# Patient Record
Sex: Female | Born: 1975 | Race: White | Hispanic: No | Marital: Married | Smoking: Never smoker
Health system: Southern US, Community
[De-identification: ages and names within clinical notes are randomized; demographics above are authoritative.]

## PROBLEM LIST (undated history)

## (undated) DIAGNOSIS — R002 Palpitations: Secondary | ICD-10-CM

## (undated) DIAGNOSIS — M542 Cervicalgia: Secondary | ICD-10-CM

## (undated) DIAGNOSIS — R7303 Prediabetes: Secondary | ICD-10-CM

## (undated) DIAGNOSIS — R0602 Shortness of breath: Secondary | ICD-10-CM

## (undated) DIAGNOSIS — Z86718 Personal history of other venous thrombosis and embolism: Secondary | ICD-10-CM

## (undated) DIAGNOSIS — R0600 Dyspnea, unspecified: Secondary | ICD-10-CM

## (undated) DIAGNOSIS — F329 Major depressive disorder, single episode, unspecified: Secondary | ICD-10-CM

## (undated) DIAGNOSIS — M2550 Pain in unspecified joint: Secondary | ICD-10-CM

## (undated) DIAGNOSIS — Z91018 Allergy to other foods: Secondary | ICD-10-CM

## (undated) DIAGNOSIS — K219 Gastro-esophageal reflux disease without esophagitis: Secondary | ICD-10-CM

## (undated) DIAGNOSIS — R51 Headache: Secondary | ICD-10-CM

## (undated) DIAGNOSIS — G8929 Other chronic pain: Secondary | ICD-10-CM

## (undated) DIAGNOSIS — K649 Unspecified hemorrhoids: Secondary | ICD-10-CM

## (undated) DIAGNOSIS — C50919 Malignant neoplasm of unspecified site of unspecified female breast: Secondary | ICD-10-CM

## (undated) DIAGNOSIS — Z87898 Personal history of other specified conditions: Secondary | ICD-10-CM

## (undated) DIAGNOSIS — R519 Headache, unspecified: Secondary | ICD-10-CM

## (undated) DIAGNOSIS — K5900 Constipation, unspecified: Secondary | ICD-10-CM

## (undated) DIAGNOSIS — K589 Irritable bowel syndrome without diarrhea: Secondary | ICD-10-CM

## (undated) DIAGNOSIS — M549 Dorsalgia, unspecified: Secondary | ICD-10-CM

## (undated) DIAGNOSIS — E669 Obesity, unspecified: Secondary | ICD-10-CM

## (undated) DIAGNOSIS — K829 Disease of gallbladder, unspecified: Secondary | ICD-10-CM

## (undated) DIAGNOSIS — S3992XA Unspecified injury of lower back, initial encounter: Secondary | ICD-10-CM

## (undated) DIAGNOSIS — M7989 Other specified soft tissue disorders: Secondary | ICD-10-CM

## (undated) DIAGNOSIS — E559 Vitamin D deficiency, unspecified: Secondary | ICD-10-CM

## (undated) DIAGNOSIS — J40 Bronchitis, not specified as acute or chronic: Secondary | ICD-10-CM

## (undated) DIAGNOSIS — W19XXXA Unspecified fall, initial encounter: Secondary | ICD-10-CM

## (undated) DIAGNOSIS — R011 Cardiac murmur, unspecified: Secondary | ICD-10-CM

## (undated) DIAGNOSIS — F32A Depression, unspecified: Secondary | ICD-10-CM

## (undated) HISTORY — DX: Unspecified fall, initial encounter: W19.XXXA

## (undated) HISTORY — PX: OTHER SURGICAL HISTORY: SHX169

## (undated) HISTORY — PX: CHOLECYSTECTOMY: SHX55

## (undated) HISTORY — DX: Constipation, unspecified: K59.00

## (undated) HISTORY — DX: Pain in unspecified joint: M25.50

## (undated) HISTORY — DX: Personal history of other venous thrombosis and embolism: Z86.718

## (undated) HISTORY — PX: FOOT SURGERY: SHX648

## (undated) HISTORY — DX: Irritable bowel syndrome, unspecified: K58.9

## (undated) HISTORY — DX: Other chronic pain: G89.29

## (undated) HISTORY — DX: Cervicalgia: M54.2

## (undated) HISTORY — DX: Shortness of breath: R06.02

## (undated) HISTORY — DX: Vitamin D deficiency, unspecified: E55.9

## (undated) HISTORY — DX: Other specified soft tissue disorders: M79.89

## (undated) HISTORY — DX: Allergy to other foods: Z91.018

## (undated) HISTORY — DX: Disease of gallbladder, unspecified: K82.9

## (undated) HISTORY — DX: Depression, unspecified: F32.A

## (undated) HISTORY — DX: Palpitations: R00.2

---
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
## 2010-07-21 ENCOUNTER — Ambulatory Visit: Payer: Self-pay

## 2011-11-24 ENCOUNTER — Ambulatory Visit: Payer: Self-pay | Admitting: Podiatry

## 2012-05-05 ENCOUNTER — Ambulatory Visit: Payer: Self-pay | Admitting: Family Medicine

## 2013-02-02 ENCOUNTER — Ambulatory Visit: Payer: Self-pay | Admitting: General Practice

## 2014-08-13 ENCOUNTER — Ambulatory Visit: Payer: PRIVATE HEALTH INSURANCE | Admitting: Family Medicine

## 2014-09-10 ENCOUNTER — Ambulatory Visit: Payer: PRIVATE HEALTH INSURANCE | Admitting: Family Medicine

## 2015-07-28 ENCOUNTER — Ambulatory Visit: Payer: PRIVATE HEALTH INSURANCE | Admitting: Physician Assistant

## 2015-10-08 ENCOUNTER — Encounter: Payer: Self-pay | Admitting: Physician Assistant

## 2015-10-08 ENCOUNTER — Ambulatory Visit: Payer: Self-pay | Admitting: Physician Assistant

## 2015-10-08 VITALS — BP 120/80 | HR 81 | Temp 98.3°F

## 2015-10-08 DIAGNOSIS — J018 Other acute sinusitis: Secondary | ICD-10-CM

## 2015-10-08 DIAGNOSIS — Z299 Encounter for prophylactic measures, unspecified: Secondary | ICD-10-CM

## 2015-10-08 MED ORDER — AZITHROMYCIN 250 MG PO TABS: ORAL_TABLET | ORAL | Status: DC

## 2015-10-08 MED ORDER — FLUTICASONE PROPIONATE 50 MCG/ACT NA SUSP: 2.0000 | Freq: Every day | NASAL | Status: DC

## 2015-10-08 NOTE — Progress Notes (Signed)
S: C/o runny nose and congestion for 3 days, no fever, chills, cp/sob, v/d; mucus is green and thick, cough is sporadic, c/o of facial and dental pain.pain behind r eye and r ear  Using otc meds: otc decongestant  O: PE: vitals wnl, nad, perrl eomi, normocephalic, tms dull, nasal mucosa red and swollen, throat injected, neck supple no lymph, lungs c t a, cv rrr, neuro intact  A:  Acute sinusitis   P: zpack, flonase, drink fluids, continue regular meds , use otc meds of choice, return if not improving in 5 days, return earlier if worsening

## 2015-10-21 NOTE — Progress Notes (Signed)
Patient ID: Danielle Browning, female   DOB: 06/16/1976, 41 y.o.   MRN: [id number] Patient has been scheduled a screening mammogram for 2/21 @ 4:40pm.  Patient has been notified.

## 2015-10-22 ENCOUNTER — Ambulatory Visit: Payer: Self-pay | Admitting: Physician Assistant

## 2015-10-22 ENCOUNTER — Encounter: Payer: Self-pay | Admitting: Physician Assistant

## 2015-10-22 VITALS — BP 130/80 | HR 97 | Temp 98.6°F

## 2015-10-22 DIAGNOSIS — L988 Other specified disorders of the skin and subcutaneous tissue: Secondary | ICD-10-CM

## 2015-10-22 DIAGNOSIS — N63 Unspecified lump in unspecified breast: Secondary | ICD-10-CM

## 2015-10-22 DIAGNOSIS — N649 Disorder of breast, unspecified: Secondary | ICD-10-CM

## 2015-10-22 NOTE — Progress Notes (Signed)
S: c/o rash at left breast, area has been there for about a year, will heal then ooze, not painful but recently noticed a knot in center in of rash, hasn't been there before, no fever/chills; also has a place on r ankle that flares on and off, hit ankle about a year ago and will get really itchy at times, no drainage from this site  O: vitals wnl, nad, lungs c t a, cv rrr, skin with circular lesion with hard edge, dark blue edging like a vein, oozing at areas along ?vein, small knot size of pea noticed when deeply palpating tissue, area not red like cellulitis, n/v intact, area on r ankle is healed and pink  A: skin lesion, breast nodule  P: mammogram , refer to dermatology for eval of skin lesion on left breast

## 2015-10-27 ENCOUNTER — Other Ambulatory Visit: Payer: Self-pay

## 2015-10-28 ENCOUNTER — Ambulatory Visit
Admission: RE | Admit: 2015-10-28 | Discharge: 2015-10-28 | Disposition: A | Discharge status: Home / Self Care | Payer: Managed Care, Other (non HMO) | Source: Ambulatory Visit | Attending: Physician Assistant | Admitting: Physician Assistant

## 2015-10-28 DIAGNOSIS — R921 Mammographic calcification found on diagnostic imaging of breast: Secondary | ICD-10-CM | POA: Diagnosis not present

## 2015-10-28 DIAGNOSIS — Z1231 Encounter for screening mammogram for malignant neoplasm of breast: Secondary | ICD-10-CM | POA: Insufficient documentation

## 2015-10-28 DIAGNOSIS — Z299 Encounter for prophylactic measures, unspecified: Secondary | ICD-10-CM

## 2015-10-29 ENCOUNTER — Other Ambulatory Visit: Payer: Self-pay

## 2015-10-29 DIAGNOSIS — Z299 Encounter for prophylactic measures, unspecified: Secondary | ICD-10-CM

## 2015-10-29 NOTE — Progress Notes (Signed)
Patient came in to have blood drawn per Susan's orders.  Blood was drawn from the right hand without any incident.

## 2015-10-30 LAB — CMP12+LP+TP+TSH+6AC+CBC/D/PLT
ALT: 14 IU/L (ref 0–32)
AST: 14 IU/L (ref 0–40)
Albumin/Globulin Ratio: 1.3 (ref 1.1–2.5)
Albumin: 3.8 g/dL (ref 3.5–5.5)
Alkaline Phosphatase: 82 IU/L (ref 39–117)
BASOS ABS: 0 10*3/uL (ref 0.0–0.2)
BUN / CREAT RATIO: 17 (ref 9–23)
BUN: 11 mg/dL (ref 6–24)
Basos: 0 %
Bilirubin Total: 0.3 mg/dL (ref 0.0–1.2)
CALCIUM: 8.9 mg/dL (ref 8.7–10.2)
CHLORIDE: 101 mmol/L (ref 96–106)
CREATININE: 0.66 mg/dL (ref 0.57–1.00)
Chol/HDL Ratio: 4.5 ratio units — ABNORMAL HIGH (ref 0.0–4.4)
Cholesterol, Total: 194 mg/dL (ref 100–199)
EOS (ABSOLUTE): 0.2 10*3/uL (ref 0.0–0.4)
EOS: 3 %
ESTIMATED CHD RISK: 1.1 times avg. — AB (ref 0.0–1.0)
Free Thyroxine Index: 2.5 (ref 1.2–4.9)
GFR calc Af Amer: 127 mL/min/{1.73_m2} (ref >59)
GFR, EST NON AFRICAN AMERICAN: 110 mL/min/{1.73_m2} (ref >59)
GGT: 20 IU/L (ref 0–60)
GLUCOSE: 94 mg/dL (ref 65–99)
Globulin, Total: 2.9 g/dL (ref 1.5–4.5)
HDL: 43 mg/dL (ref >39)
HEMOGLOBIN: 13.1 g/dL (ref 11.1–15.9)
Hematocrit: 40.4 % (ref 34.0–46.6)
IRON: 73 ug/dL (ref 27–159)
Immature Grans (Abs): 0 10*3/uL (ref 0.0–0.1)
Immature Granulocytes: 0 %
LDH: 175 IU/L (ref 119–226)
LDL Calculated: 126 mg/dL — ABNORMAL HIGH (ref 0–99)
LYMPHS ABS: 2.1 10*3/uL (ref 0.7–3.1)
Lymphs: 29 %
MCH: 27.6 pg (ref 26.6–33.0)
MCHC: 32.4 g/dL (ref 31.5–35.7)
MCV: 85 fL (ref 79–97)
Monocytes Absolute: 0.4 10*3/uL (ref 0.1–0.9)
Monocytes: 5 %
NEUTROS ABS: 4.3 10*3/uL (ref 1.4–7.0)
Neutrophils: 63 %
PHOSPHORUS: 3.4 mg/dL (ref 2.5–4.5)
PLATELETS: 357 10*3/uL (ref 150–379)
POTASSIUM: 4.1 mmol/L (ref 3.5–5.2)
RBC: 4.75 x10E6/uL (ref 3.77–5.28)
RDW: 14.4 % (ref 12.3–15.4)
SODIUM: 140 mmol/L (ref 134–144)
T3 UPTAKE RATIO: 23 % — AB (ref 24–39)
T4 TOTAL: 10.8 ug/dL (ref 4.5–12.0)
TOTAL PROTEIN: 6.7 g/dL (ref 6.0–8.5)
TSH: 1.25 u[IU]/mL (ref 0.450–4.500)
Triglycerides: 127 mg/dL (ref 0–149)
URIC ACID: 7.1 mg/dL (ref 2.5–7.1)
VLDL Cholesterol Cal: 25 mg/dL (ref 5–40)
WBC: 7 10*3/uL (ref 3.4–10.8)

## 2015-10-30 LAB — VITAMIN D 25 HYDROXY (VIT D DEFICIENCY, FRACTURES): VIT D 25 HYDROXY: 13.1 ng/mL — AB (ref 30.0–100.0)

## 2015-10-30 LAB — HGB A1C W/O EAG: HEMOGLOBIN A1C: 5.7 % — AB (ref 4.8–5.6)

## 2015-10-30 NOTE — Progress Notes (Signed)
I spoke to the patient about her lab results and she expressed understanding.  A copy was securely emailed to the patient and a script for Vitamin D was called in to [facility] on [address] per [employer].

## 2015-10-31 ENCOUNTER — Other Ambulatory Visit: Payer: Self-pay | Admitting: Physician Assistant

## 2015-10-31 DIAGNOSIS — R928 Other abnormal and inconclusive findings on diagnostic imaging of breast: Secondary | ICD-10-CM

## 2015-11-06 ENCOUNTER — Other Ambulatory Visit: Payer: Self-pay | Admitting: Physician Assistant

## 2015-11-06 ENCOUNTER — Ambulatory Visit
Admission: RE | Admit: 2015-11-06 | Discharge: 2015-11-06 | Disposition: A | Discharge status: Home / Self Care | Payer: Managed Care, Other (non HMO) | Source: Ambulatory Visit | Attending: Physician Assistant | Admitting: Physician Assistant

## 2015-11-06 DIAGNOSIS — R921 Mammographic calcification found on diagnostic imaging of breast: Secondary | ICD-10-CM | POA: Insufficient documentation

## 2015-11-06 DIAGNOSIS — R928 Other abnormal and inconclusive findings on diagnostic imaging of breast: Secondary | ICD-10-CM

## 2015-11-06 DIAGNOSIS — N6002 Solitary cyst of left breast: Secondary | ICD-10-CM | POA: Diagnosis not present

## 2015-11-19 ENCOUNTER — Encounter: Payer: Self-pay | Admitting: Physician Assistant

## 2015-11-19 ENCOUNTER — Ambulatory Visit: Payer: Self-pay | Admitting: Physician Assistant

## 2015-11-19 VITALS — BP 130/70 | HR 84 | Temp 98.3°F

## 2015-11-19 DIAGNOSIS — F4321 Adjustment disorder with depressed mood: Secondary | ICD-10-CM

## 2015-11-19 MED ORDER — BUPROPION HCL ER (XL) 150 MG PO TB24: 150.0000 mg | ORAL_TABLET | Freq: Every day | ORAL | Status: DC

## 2015-11-19 NOTE — Progress Notes (Signed)
S: pt states her younger sister (age 42) was diagnosed with metastatic colon cancer, states it has spread to ovaries and peritoneum, was told she should get a colonoscopy to get checked, pt states she has intermittent episodes of hard stool followed by a lot of watery stool, some abdominal pain on and off but thinks its from IBS, also has been dealing with a "dropped bladder" for years, now its gotten so she urinates on herself every time she coughs or sneezes, at the same time she has a prolapsed rectum and is having to push it in to have a bowel movement, concerned about her weight, feels she has "let herself go", states the stress of being a single mom and dealing with all of the previous events has finally crashed in on her, doesn't know where to start or what to do, denies si/hi  O: vitals wnl, nad, pt very tearful, upset, lungs c t a, cv rrr, abd soft nontender bs normal; neuro intact  A: anxiety, referals needed for colonoscopy, bladder and rectal prolapse  P: wellbutrin xl 150mg  qd, hopefully medication will help with depression and aid in weight loss, walked pt to [facility] to set up appointment for counseling, refer to Dr Allen Norris for colonoscopy, refer to Dr cope for assessment of urinary incontinence, pt to discuss rectal prolapse with dr Allen Norris and see what her options are

## 2015-11-20 ENCOUNTER — Telehealth: Payer: Self-pay | Admitting: Gastroenterology

## 2015-11-20 NOTE — Telephone Encounter (Signed)
colonoscopy

## 2015-11-21 NOTE — Telephone Encounter (Signed)
LVM for pt to return my call.

## 2015-11-27 NOTE — Progress Notes (Signed)
Patient ID: Danielle Browning, female   DOB: 02-28-1976, 40 y.o.   MRN: [id number] Patient has an appointment to see Dr. Jacqlyn Larsen @ [facility] on 12/26/15 @10 :45am.  Referral for her colonoscopy was sent and is still pending appointment.  Patient is aware of appointment.

## 2015-11-28 ENCOUNTER — Encounter: Payer: Self-pay | Admitting: Physician Assistant

## 2015-11-28 ENCOUNTER — Ambulatory Visit: Payer: Self-pay | Admitting: Physician Assistant

## 2015-11-28 VITALS — BP 135/70 | HR 87 | Temp 98.0°F

## 2015-11-28 DIAGNOSIS — J320 Chronic maxillary sinusitis: Secondary | ICD-10-CM

## 2015-11-28 DIAGNOSIS — J209 Acute bronchitis, unspecified: Secondary | ICD-10-CM

## 2015-11-28 MED ORDER — CLARITHROMYCIN 500 MG PO TABS: 500.0000 mg | ORAL_TABLET | Freq: Two times a day (BID) | ORAL | Status: DC

## 2015-11-28 MED ORDER — PSEUDOEPH-BROMPHEN-DM 30-2-10 MG/5ML PO SYRP: 5.0000 mL | ORAL_SOLUTION | Freq: Four times a day (QID) | ORAL | Status: DC | PRN

## 2015-11-28 NOTE — Progress Notes (Signed)
Subjective:    Patient ID: Danielle Browning, female    DOB: 17-Jun-1976, 40 y.o.   MRN: [id number]  HPI Patient Patient c/o sinus pressure, cough, and chest congestion for one week.  Patient  states sinus pressure increase with laying down 2nd to post nasal drainage.  Denies fever/chills or N/V/D. No palliative measure for compliant.    Review of Systems Morbid obesity and depression.     Objective:   Physical Exam HEENT for left maxillary sinus guarding. Bilateral edematous nasal turbinates. Thick nasal drainage. Neck supple without adenopathy. Lungs with bilateral upper lobe Rales.  Heart RRR.        Assessment & Plan:  Left maxillary Sinusitis and Bronchitis. Patient given prescriptions for Biaxin and Bromfed DM.  Advised to follow in 3 days if no improvement.

## 2015-12-05 ENCOUNTER — Ambulatory Visit: Payer: Self-pay | Admitting: Physician Assistant

## 2015-12-05 ENCOUNTER — Encounter: Payer: Self-pay | Admitting: Physician Assistant

## 2015-12-05 VITALS — BP 120/80 | HR 97 | Temp 98.4°F

## 2015-12-05 DIAGNOSIS — R05 Cough: Secondary | ICD-10-CM

## 2015-12-05 DIAGNOSIS — R059 Cough, unspecified: Secondary | ICD-10-CM

## 2015-12-05 LAB — POCT INFLUENZA A/B
Influenza A, POC: NEGATIVE
Influenza B, POC: NEGATIVE

## 2015-12-05 MED ORDER — METHYLPREDNISOLONE 4 MG PO TBPK: ORAL_TABLET | ORAL | Status: DC

## 2015-12-05 MED ORDER — HYDROCOD POLST-CPM POLST ER 10-8 MG/5ML PO SUER: 5.0000 mL | Freq: Two times a day (BID) | ORAL | Status: DC | PRN

## 2015-12-05 NOTE — Progress Notes (Signed)
S: seen last week for uri/bronchitis, given biaxin and bromphed cough syrup, cough continues, denies fever/chills, both kids have tested + flu this week, mucus is mainly clear, left ear still feels like fluid, no sore throat  O: vitals wnl, nad, tms dull b/l, nasal mucosa wnl, throat wnl, neck supple no lymph, lungs ct  A, cv rrr, flu swab neg  A: acute uri  P: medrol dose pack, tussionex 165ml nr

## 2015-12-08 NOTE — Telephone Encounter (Signed)
Tried contacting pt again but voicemail box was not set up. Mailed letter asking pt to call to schedule.

## 2015-12-23 ENCOUNTER — Ambulatory Visit: Payer: Self-pay | Admitting: Physician Assistant

## 2016-01-01 ENCOUNTER — Ambulatory Visit: Payer: Self-pay | Admitting: Physician Assistant

## 2016-01-01 ENCOUNTER — Encounter: Payer: Self-pay | Admitting: Physician Assistant

## 2016-01-01 VITALS — BP 120/70 | HR 94 | Temp 98.6°F

## 2016-01-01 DIAGNOSIS — F411 Generalized anxiety disorder: Secondary | ICD-10-CM

## 2016-01-01 NOTE — Progress Notes (Signed)
S: here for recheck of anxiety, hasn't felt a lot of difference with the medication, been on times one month, missed her counseling appointment, still having trouble at work, dr cope added imipramine to her meds so he said it wouldn't be wise to change her meds at this time  O: vitals wnl, nad, pt tearful at times  A: anxiety/depression  P: continue wellbutrin will recheck in 1 month

## 2016-08-18 ENCOUNTER — Ambulatory Visit: Payer: Self-pay | Admitting: Physician Assistant

## 2016-08-18 ENCOUNTER — Encounter: Payer: Self-pay | Admitting: Physician Assistant

## 2016-08-18 VITALS — BP 130/85 | HR 98 | Temp 97.7°F

## 2016-08-18 DIAGNOSIS — J209 Acute bronchitis, unspecified: Secondary | ICD-10-CM

## 2016-08-18 MED ORDER — BENZONATATE 200 MG PO CAPS: 200.0000 mg | ORAL_CAPSULE | Freq: Three times a day (TID) | ORAL | 0 refills | Status: DC | PRN

## 2016-08-18 MED ORDER — LEVOFLOXACIN 500 MG PO TABS: 500.0000 mg | ORAL_TABLET | Freq: Every day | ORAL | 0 refills | Status: DC

## 2016-08-18 MED ORDER — HYDROCOD POLST-CPM POLST ER 10-8 MG/5ML PO SUER: 5.0000 mL | Freq: Two times a day (BID) | ORAL | 0 refills | Status: DC | PRN

## 2016-08-18 MED ORDER — BUPROPION HCL ER (SR) 150 MG PO TB12: 150.0000 mg | ORAL_TABLET | Freq: Two times a day (BID) | ORAL | 6 refills | Status: DC

## 2016-08-18 NOTE — Progress Notes (Signed)
S: C/o cough and congestion,  chest is sore from coughing, fever, chills yesterday, none today, cough is dry and hacking; keeping pt awake at night;  denies cardiac type chest pain or sob, v/d, abd pain Remainder ros neg  O: vitals wnl, nad, tms clear, throat injected, neck supple no lymph, lungs c t a, cv rrr, neuro intact, cough is dry and hacking  A:  Acute bronchitis   P:  rx medication, levaquin 500mg  qd, tessalon perls during the day, tussionex when at home; refill on wellbutrin,  use otc meds, tylenol or motrin as needed for fever/chills, return if not better in 3 -5 days, return earlier if worsening

## 2016-10-19 ENCOUNTER — Ambulatory Visit: Payer: Self-pay | Admitting: Family Medicine

## 2016-11-02 ENCOUNTER — Other Ambulatory Visit: Payer: Self-pay | Admitting: Physician Assistant

## 2016-11-02 DIAGNOSIS — Z1231 Encounter for screening mammogram for malignant neoplasm of breast: Secondary | ICD-10-CM

## 2016-11-10 ENCOUNTER — Ambulatory Visit
Admission: RE | Admit: 2016-11-10 | Discharge: 2016-11-10 | Disposition: A | Discharge status: Home / Self Care | Payer: Managed Care, Other (non HMO) | Source: Ambulatory Visit | Attending: Physician Assistant | Admitting: Physician Assistant

## 2016-11-10 DIAGNOSIS — Z1231 Encounter for screening mammogram for malignant neoplasm of breast: Secondary | ICD-10-CM

## 2016-11-11 ENCOUNTER — Other Ambulatory Visit: Payer: Self-pay | Admitting: Physician Assistant

## 2016-11-11 DIAGNOSIS — R928 Other abnormal and inconclusive findings on diagnostic imaging of breast: Secondary | ICD-10-CM

## 2016-11-11 DIAGNOSIS — R921 Mammographic calcification found on diagnostic imaging of breast: Secondary | ICD-10-CM

## 2016-11-17 ENCOUNTER — Other Ambulatory Visit: Payer: Self-pay | Admitting: Emergency Medicine

## 2016-11-17 MED ORDER — VITAMIN D (ERGOCALCIFEROL) 1.25 MG (50000 UNIT) PO CAPS: 50000.0000 [IU] | ORAL_CAPSULE | ORAL | 4 refills | Status: DC

## 2016-11-17 NOTE — Telephone Encounter (Signed)
Med refill for vit d approved 

## 2016-11-18 ENCOUNTER — Ambulatory Visit
Admission: RE | Admit: 2016-11-18 | Discharge: 2016-11-18 | Disposition: A | Discharge status: Home / Self Care | Payer: Managed Care, Other (non HMO) | Source: Ambulatory Visit | Attending: Physician Assistant | Admitting: Physician Assistant

## 2016-11-18 DIAGNOSIS — R928 Other abnormal and inconclusive findings on diagnostic imaging of breast: Secondary | ICD-10-CM | POA: Diagnosis not present

## 2016-11-18 DIAGNOSIS — R921 Mammographic calcification found on diagnostic imaging of breast: Secondary | ICD-10-CM | POA: Insufficient documentation

## 2016-11-23 ENCOUNTER — Ambulatory Visit: Payer: Managed Care, Other (non HMO)

## 2016-11-23 ENCOUNTER — Other Ambulatory Visit: Payer: Self-pay

## 2016-11-23 ENCOUNTER — Telehealth: Payer: Self-pay

## 2016-11-23 DIAGNOSIS — Z1211 Encounter for screening for malignant neoplasm of colon: Secondary | ICD-10-CM

## 2016-11-23 MED ORDER — NA SULFATE-K SULFATE-MG SULF 17.5-3.13-1.6 GM/177ML PO SOLN: 1.0000 | Freq: Once | ORAL | 0 refills | Status: AC

## 2016-11-23 NOTE — Telephone Encounter (Signed)
Gastroenterology Pre-Procedure Review  Request Date: 11/29/16 Requesting Physician: Dr. Allen Norris  PATIENT REVIEW QUESTIONS: The patient responded to the following health history questions as indicated:    1. Are you having any GI issues? Loose stool, possible hemorrhoids 2. Do you have a personal history of Polyps? no 3. Do you have a family history of Colon Cancer or Polyps? yes (sister colon cancer) 4. Diabetes Mellitus? no 5. Joint replacements in the past 12 months?no 6. Major health problems in the past 3 months?no 7. Any artificial heart valves, MVP, or defibrillator?no    MEDICATIONS & ALLERGIES:    Patient reports the following regarding taking any anticoagulation/antiplatelet therapy:   Plavix, Coumadin, Eliquis, Xarelto, Lovenox, Pradaxa, Brilinta, or Effient? no Aspirin? no  Patient confirms/reports the following medications:  Current Outpatient Prescriptions  Medication Sig Dispense Refill  . benzonatate (TESSALON) 200 MG capsule Take 1 capsule (200 mg total) by mouth 3 (three) times daily as needed for cough. 30 capsule 0  . buPROPion (WELLBUTRIN SR) 150 MG 12 hr tablet Take 1 tablet (150 mg total) by mouth 2 (two) times daily. 30 tablet 6  . chlorpheniramine-HYDROcodone (TUSSIONEX PENNKINETIC ER) 10-8 MG/5ML SUER Take 5 mLs by mouth every 12 (twelve) hours as needed for cough. 150 mL 0  . imipramine (TOFRANIL) 10 MG tablet Take 10 mg by mouth.    . levofloxacin (LEVAQUIN) 500 MG tablet Take 1 tablet (500 mg total) by mouth daily. 10 tablet 0  . Vitamin D, Ergocalciferol, (DRISDOL) 50000 units CAPS capsule Take 1 capsule (50,000 Units total) by mouth every 7 (seven) days. 12 capsule 4   No current facility-administered medications for this visit.     Patient confirms/reports the following allergies:  Allergies  Allergen Reactions  . Penicillins   . Sulfa Antibiotics     No orders of the defined types were placed in this encounter.   AUTHORIZATION  INFORMATION Primary Insurance: 1D#: Group #:  Secondary Insurance: 1D#: Group #:  SCHEDULE INFORMATION: Date: 11/29/16 Time: Location: [place]

## 2016-11-25 ENCOUNTER — Other Ambulatory Visit: Payer: Self-pay

## 2016-11-25 DIAGNOSIS — Z800 Family history of malignant neoplasm of digestive organs: Secondary | ICD-10-CM

## 2016-11-29 ENCOUNTER — Ambulatory Visit: Admit: 2016-11-29 | Payer: Managed Care, Other (non HMO) | Admitting: Gastroenterology

## 2016-11-29 HISTORY — DX: Cardiac murmur, unspecified: R01.1

## 2016-11-29 HISTORY — DX: Prediabetes: R73.03

## 2016-11-29 HISTORY — DX: Dorsalgia, unspecified: M54.9

## 2016-11-29 HISTORY — DX: Unspecified injury of lower back, initial encounter: S39.92XA

## 2016-11-29 HISTORY — DX: Headache: R51

## 2016-11-29 HISTORY — DX: Gastro-esophageal reflux disease without esophagitis: K21.9

## 2016-11-29 HISTORY — DX: Headache, unspecified: R51.9

## 2016-11-29 HISTORY — DX: Bronchitis, not specified as acute or chronic: J40

## 2016-11-29 HISTORY — DX: Personal history of other specified conditions: Z87.898

## 2016-11-29 HISTORY — DX: Unspecified hemorrhoids: K64.9

## 2016-11-29 HISTORY — DX: Dyspnea, unspecified: R06.00

## 2016-11-29 SURGERY — COLONOSCOPY WITH PROPOFOL
Anesthesia: Choice

## 2016-12-17 ENCOUNTER — Ambulatory Visit
Admission: RE | Admit: 2016-12-17 | Discharge: 2016-12-17 | Disposition: A | Discharge status: Home / Self Care | Payer: Managed Care, Other (non HMO) | Source: Ambulatory Visit | Attending: Gastroenterology | Admitting: Gastroenterology

## 2016-12-17 ENCOUNTER — Ambulatory Visit: Payer: Managed Care, Other (non HMO) | Admitting: Certified Registered"

## 2016-12-17 ENCOUNTER — Encounter: Payer: Self-pay | Admitting: Anesthesiology

## 2016-12-17 ENCOUNTER — Encounter
Admission: RE | Disposition: A | Discharge status: Home / Self Care | Payer: Self-pay | Source: Ambulatory Visit | Attending: Gastroenterology

## 2016-12-17 DIAGNOSIS — Z800 Family history of malignant neoplasm of digestive organs: Secondary | ICD-10-CM | POA: Diagnosis not present

## 2016-12-17 DIAGNOSIS — Z1211 Encounter for screening for malignant neoplasm of colon: Secondary | ICD-10-CM | POA: Insufficient documentation

## 2016-12-17 DIAGNOSIS — K640 First degree hemorrhoids: Secondary | ICD-10-CM | POA: Diagnosis not present

## 2016-12-17 DIAGNOSIS — R51 Headache: Secondary | ICD-10-CM | POA: Diagnosis not present

## 2016-12-17 DIAGNOSIS — R7303 Prediabetes: Secondary | ICD-10-CM | POA: Diagnosis not present

## 2016-12-17 DIAGNOSIS — K219 Gastro-esophageal reflux disease without esophagitis: Secondary | ICD-10-CM | POA: Insufficient documentation

## 2016-12-17 DIAGNOSIS — K5730 Diverticulosis of large intestine without perforation or abscess without bleeding: Secondary | ICD-10-CM | POA: Insufficient documentation

## 2016-12-17 DIAGNOSIS — Z6841 Body Mass Index (BMI) 40.0 and over, adult: Secondary | ICD-10-CM | POA: Diagnosis not present

## 2016-12-17 DIAGNOSIS — Z79899 Other long term (current) drug therapy: Secondary | ICD-10-CM | POA: Insufficient documentation

## 2016-12-17 HISTORY — PX: COLONOSCOPY WITH PROPOFOL: SHX5780

## 2016-12-17 LAB — GLUCOSE, CAPILLARY: Glucose-Capillary: 111 mg/dL — ABNORMAL HIGH (ref 65–99)

## 2016-12-17 LAB — POCT PREGNANCY, URINE: Preg Test, Ur: NEGATIVE

## 2016-12-17 SURGERY — COLONOSCOPY WITH PROPOFOL
Anesthesia: General

## 2016-12-17 MED ORDER — PROPOFOL 10 MG/ML IV BOLUS
INTRAVENOUS | Status: DC | PRN
  Administered 2016-12-17: 70 mg via INTRAVENOUS
  Administered 2016-12-17: 30 mg via INTRAVENOUS

## 2016-12-17 MED ORDER — PROPOFOL 500 MG/50ML IV EMUL
INTRAVENOUS | Status: AC
  Filled 2016-12-17: qty 50

## 2016-12-17 MED ORDER — SODIUM CHLORIDE 0.9 % IV SOLN
INTRAVENOUS | Status: DC
  Administered 2016-12-17: 08:00:00 via INTRAVENOUS

## 2016-12-17 MED ORDER — PROPOFOL 500 MG/50ML IV EMUL
INTRAVENOUS | Status: DC | PRN
  Administered 2016-12-17: 150 ug/kg/min via INTRAVENOUS

## 2016-12-17 MED ORDER — MIDAZOLAM HCL 2 MG/2ML IJ SOLN
INTRAMUSCULAR | Status: DC | PRN
  Administered 2016-12-17: 2 mg via INTRAVENOUS

## 2016-12-17 MED ORDER — MIDAZOLAM HCL 2 MG/2ML IJ SOLN
INTRAMUSCULAR | Status: AC
  Filled 2016-12-17: qty 2

## 2016-12-17 MED ORDER — PHENYLEPHRINE HCL 10 MG/ML IJ SOLN
INTRAMUSCULAR | Status: AC
  Filled 2016-12-17: qty 1

## 2016-12-17 NOTE — Op Note (Signed)
[facility] Gastroenterology Patient Name: Danielle Browning Procedure Date: 12/17/2016 7:52 AM MRN: [id number] Account #: [id number] Date of Birth: May 10, 1976 Admit Type: Outpatient Age: 41 Room: [facility] ENDO ROOM 4 Gender: Female Note Status: Finalized Procedure:            Colonoscopy Indications:          Colon cancer screening in patient at increased risk:                        Colorectal cancer in sister Providers:            Wyline Mood MD, MD Referring MD:         Faythe Ghee, MD (Referring MD) Medicines:            Monitored Anesthesia Care Complications:        No immediate complications. Procedure:            Pre-Anesthesia Assessment:                       - Prior to the procedure, a History and Physical was                        performed, and patient medications, allergies and                        sensitivities were reviewed. The patient's tolerance of                        previous anesthesia was reviewed.                       - The risks and benefits of the procedure and the                        sedation options and risks were discussed with the                        patient. All questions were answered and informed                        consent was obtained.                       - ASA Grade Assessment: III - A patient with severe                        systemic disease.                       After obtaining informed consent, the colonoscope was                        passed under direct vision. Throughout the procedure,                        the patient's blood pressure, pulse, and oxygen                        saturations were monitored continuously. The  Colonoscope was introduced through the anus and                        advanced to the the cecum, identified by the                        appendiceal orifice, IC valve and transillumination.                        The colonoscopy was performed with ease. The patient                         tolerated the procedure well. The quality of the bowel                        preparation was excellent. Findings:      The perianal and digital rectal examinations were normal.      Multiple small-mouthed diverticula were found in the sigmoid colon.      Non-bleeding internal hemorrhoids were found during retroflexion. The       hemorrhoids were small and Grade I (internal hemorrhoids that do not       prolapse).      The exam was otherwise without abnormality on direct and retroflexion       views. Impression:           - Diverticulosis in the sigmoid colon.                       - Non-bleeding internal hemorrhoids.                       - The examination was otherwise normal on direct and                        retroflexion views.                       - No specimens collected. Recommendation:       - Discharge patient to home (with escort).                       - Resume previous diet.                       - Continue present medications.                       - Repeat colonoscopy in 5 years for surveillance. Procedure Code(s):    --- Professional ---                       M0102, Colorectal cancer screening; colonoscopy on                        individual at high risk Diagnosis Code(s):    --- Professional ---                       Z80.0, Family history of malignant neoplasm of                        digestive organs  K64.0, First degree hemorrhoids                       K57.30, Diverticulosis of large intestine without                        perforation or abscess without bleeding CPT copyright 2016 American Medical Association. All rights reserved. The codes documented in this report are preliminary and upon coder review may  be revised to meet current compliance requirements. Wyline Mood, MD Wyline Mood MD, MD 12/17/2016 8:13:58 AM This report has been signed electronically. Number of Addenda: 0 Note Initiated On: 12/17/2016 7:52  AM Scope Withdrawal Time: 0 hours 9 minutes 35 seconds  Total Procedure Duration: 0 hours 13 minutes 38 seconds       [facility]
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
## 2016-12-17 NOTE — Anesthesia Post-op Follow-up Note (Cosign Needed)
Anesthesia QCDR form completed.        

## 2016-12-17 NOTE — Anesthesia Postprocedure Evaluation (Signed)
Anesthesia Post Note  Patient: Danielle Browning  Procedure(s) Performed: Procedure(s) (LRB): COLONOSCOPY WITH PROPOFOL (N/A)  Patient location during evaluation: Endoscopy Anesthesia Type: General Level of consciousness: awake and alert Pain management: pain level controlled Vital Signs Assessment: post-procedure vital signs reviewed and stable Respiratory status: spontaneous breathing, nonlabored ventilation, respiratory function stable and patient connected to nasal cannula oxygen Cardiovascular status: blood pressure returned to baseline and stable Postop Assessment: no signs of nausea or vomiting Anesthetic complications: no     Last Vitals:  Vitals:   12/17/16 0830 12/17/16 0840  BP: 104/67 112/68  Pulse: 74 79  Resp: 17 18  Temp:      Last Pain:  Vitals:   12/17/16 0810  TempSrc: Tympanic                 Precious Haws Czar Ysaguirre

## 2016-12-17 NOTE — Anesthesia Procedure Notes (Signed)
Performed by: Bastion Bolger Pre-anesthesia Checklist: Patient identified, Emergency Drugs available, Suction available, Patient being monitored and Timeout performed Patient Re-evaluated:Patient Re-evaluated prior to inductionOxygen Delivery Method: Nasal cannula Preoxygenation: Pre-oxygenation with 100% oxygen Intubation Type: IV induction       

## 2016-12-17 NOTE — Transfer of Care (Signed)
Immediate Anesthesia Transfer of Care Note  Patient: Danielle Browning  Procedure(s) Performed: Procedure(s): COLONOSCOPY WITH PROPOFOL (N/A)  Patient Location: PACU  Anesthesia Type:General  Level of Consciousness: sedated and responds to stimulation  Airway & Oxygen Therapy: Patient Spontanous Breathing and Patient connected to nasal cannula oxygen  Post-op Assessment: Report given to RN and Post -op Vital signs reviewed and stable  Post vital signs: Reviewed and stable  Last Vitals:  Vitals:   12/17/16 0724 12/17/16 0815  BP: (!) 139/92 (!) 99/53  Pulse: 78 94  Resp: 16 16  Temp: 36.4 C     Last Pain:  Vitals:   12/17/16 0724  TempSrc: Tympanic         Complications: No apparent anesthesia complications
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
## 2016-12-20 ENCOUNTER — Encounter: Payer: Self-pay | Admitting: Gastroenterology

## 2017-01-03 ENCOUNTER — Ambulatory Visit: Payer: Self-pay | Admitting: Family

## 2017-01-06 ENCOUNTER — Encounter: Payer: Self-pay | Admitting: Family

## 2017-01-06 ENCOUNTER — Ambulatory Visit (INDEPENDENT_AMBULATORY_CARE_PROVIDER_SITE_OTHER): Payer: Managed Care, Other (non HMO) | Admitting: Family

## 2017-01-06 ENCOUNTER — Other Ambulatory Visit (HOSPITAL_COMMUNITY)
Admission: RE | Admit: 2017-01-06 | Discharge: 2017-01-06 | Disposition: A | Discharge status: Home / Self Care | Payer: Managed Care, Other (non HMO) | Source: Ambulatory Visit | Attending: Family | Admitting: Family

## 2017-01-06 VITALS — BP 126/78 | HR 80 | Temp 98.1°F | Ht 61.0 in | Wt 288.8 lb

## 2017-01-06 DIAGNOSIS — Z0000 Encounter for general adult medical examination without abnormal findings: Secondary | ICD-10-CM

## 2017-01-06 DIAGNOSIS — N6310 Unspecified lump in the right breast, unspecified quadrant: Secondary | ICD-10-CM

## 2017-01-06 LAB — HM PAP SMEAR: HM PAP: NEGATIVE
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
## 2017-01-06 NOTE — Patient Instructions (Addendum)
Pleasure meeting you  Fasting lab appointment  f/u 3 mos  Health Maintenance, Female Adopting a healthy lifestyle and getting preventive care can go a long way to promote health and wellness. Talk with your health care provider about what schedule of regular examinations is right for you. This is a good chance for you to check in with your provider about disease prevention and staying healthy. In between checkups, there are plenty of things you can do on your own. Experts have done a lot of research about which lifestyle changes and preventive measures are most likely to keep you healthy. Ask your health care provider for more information. Weight and diet Eat a healthy diet  Be sure to include plenty of vegetables, fruits, low-fat dairy products, and lean protein.  Do not eat a lot of foods high in solid fats, added sugars, or salt.  Get regular exercise. This is one of the most important things you can do for your health.  Most adults should exercise for at least 150 minutes each week. The exercise should increase your heart rate and make you sweat (moderate-intensity exercise).  Most adults should also do strengthening exercises at least twice a week. This is in addition to the moderate-intensity exercise. Maintain a healthy weight  Body mass index (BMI) is a measurement that can be used to identify possible weight problems. It estimates body fat based on height and weight. Your health care provider can help determine your BMI and help you achieve or maintain a healthy weight.  For females 80 years of age and older:  A BMI below 18.5 is considered underweight.  A BMI of 18.5 to 24.9 is normal.  A BMI of 25 to 29.9 is considered overweight.  A BMI of 30 and above is considered obese. Watch levels of cholesterol and blood lipids  You should start having your blood tested for lipids and cholesterol at 43 years of age, then have this test every 5 years.  You may need to have your  cholesterol levels checked more often if:  Your lipid or cholesterol levels are high.  You are older than 43 years of age.  You are at high risk for heart disease. Cancer screening Lung Cancer  Lung cancer screening is recommended for adults 62-17 years old who are at high risk for lung cancer because of a history of smoking.  A yearly low-dose CT scan of the lungs is recommended for people who:  Currently smoke.  Have quit within the past 15 years.  Have at least a 30-pack-year history of smoking. A pack year is smoking an average of one pack of cigarettes a day for 1 year.  Yearly screening should continue until it has been 15 years since you quit.  Yearly screening should stop if you develop a health problem that would prevent you from having lung cancer treatment. Breast Cancer  Practice breast self-awareness. This means understanding how your breasts normally appear and feel.  It also means doing regular breast self-exams. Let your health care provider know about any changes, no matter how small.  If you are in your 20s or 30s, you should have a clinical breast exam (CBE) by a health care provider every 1-3 years as part of a regular health exam.  If you are 80 or older, have a CBE every year. Also consider having a breast X-ray (mammogram) every year.  If you have a family history of breast cancer, talk to your health care provider about genetic  screening.  If you are at high risk for breast cancer, talk to your health care provider about having an MRI and a mammogram every year.  Breast cancer gene (BRCA) assessment is recommended for women who have family members with BRCA-related cancers. BRCA-related cancers include:  Breast.  Ovarian.  Tubal.  Peritoneal cancers.  Results of the assessment will determine the need for genetic counseling and BRCA1 and BRCA2 testing. Cervical Cancer  Your health care provider may recommend that you be screened regularly for  cancer of the pelvic organs (ovaries, uterus, and vagina). This screening involves a pelvic examination, including checking for microscopic changes to the surface of your cervix (Pap test). You may be encouraged to have this screening done every 3 years, beginning at age 34.  For women ages 27-65, health care providers may recommend pelvic exams and Pap testing every 3 years, or they may recommend the Pap and pelvic exam, combined with testing for human papilloma virus (HPV), every 5 years. Some types of HPV increase your risk of cervical cancer. Testing for HPV may also be done on women of any age with unclear Pap test results.  Other health care providers may not recommend any screening for nonpregnant women who are considered low risk for pelvic cancer and who do not have symptoms. Ask your health care provider if a screening pelvic exam is right for you.  If you have had past treatment for cervical cancer or a condition that could lead to cancer, you need Pap tests and screening for cancer for at least 20 years after your treatment. If Pap tests have been discontinued, your risk factors (such as having a new sexual partner) need to be reassessed to determine if screening should resume. Some women have medical problems that increase the chance of getting cervical cancer. In these cases, your health care provider may recommend more frequent screening and Pap tests. Colorectal Cancer  This type of cancer can be detected and often prevented.  Routine colorectal cancer screening usually begins at 43 years of age and continues through 43 years of age.  Your health care provider may recommend screening at an earlier age if you have risk factors for colon cancer.  Your health care provider may also recommend using home test kits to check for hidden blood in the stool.  A small camera at the end of a tube can be used to examine your colon directly (sigmoidoscopy or colonoscopy). This is done to check for  the earliest forms of colorectal cancer.  Routine screening usually begins at age 102.  Direct examination of the colon should be repeated every 5-10 years through 43 years of age. However, you may need to be screened more often if early forms of precancerous polyps or small growths are found. Skin Cancer  Check your skin from head to toe regularly.  Tell your health care provider about any new moles or changes in moles, especially if there is a change in a mole's shape or color.  Also tell your health care provider if you have a mole that is larger than the size of a pencil eraser.  Always use sunscreen. Apply sunscreen liberally and repeatedly throughout the day.  Protect yourself by wearing long sleeves, pants, a wide-brimmed hat, and sunglasses whenever you are outside. Heart disease, diabetes, and high blood pressure  High blood pressure causes heart disease and increases the risk of stroke. High blood pressure is more likely to develop in:  People who have blood pressure  in the high end of the normal range (130-139/85-89 mm Hg).  People who are overweight or obese.  People who are African American.  If you are 79-60 years of age, have your blood pressure checked every 3-5 years. If you are 20 years of age or older, have your blood pressure checked every year. You should have your blood pressure measured twice-once when you are at a hospital or clinic, and once when you are not at a hospital or clinic. Record the average of the two measurements. To check your blood pressure when you are not at a hospital or clinic, you can use:  An automated blood pressure machine at a pharmacy.  A home blood pressure monitor.  If you are between 30 years and 44 years old, ask your health care provider if you should take aspirin to prevent strokes.  Have regular diabetes screenings. This involves taking a blood sample to check your fasting blood sugar level.  If you are at a normal weight and  have a low risk for diabetes, have this test once every three years after 43 years of age.  If you are overweight and have a high risk for diabetes, consider being tested at a younger age or more often. Preventing infection Hepatitis B  If you have a higher risk for hepatitis B, you should be screened for this virus. You are considered at high risk for hepatitis B if:  You were born in a country where hepatitis B is common. Ask your health care provider which countries are considered high risk.  Your parents were born in a high-risk country, and you have not been immunized against hepatitis B (hepatitis B vaccine).  You have HIV or AIDS.  You use needles to inject street drugs.  You live with someone who has hepatitis B.  You have had sex with someone who has hepatitis B.  You get hemodialysis treatment.  You take certain medicines for conditions, including cancer, organ transplantation, and autoimmune conditions. Hepatitis C  Blood testing is recommended for:  Everyone born from [birth year] through 1967.  Anyone with known risk factors for hepatitis C. Sexually transmitted infections (STIs)  You should be screened for sexually transmitted infections (STIs) including gonorrhea and chlamydia if:  You are sexually active and are younger than 43 years of age.  You are older than 43 years of age and your health care provider tells you that you are at risk for this type of infection.  Your sexual activity has changed since you were last screened and you are at an increased risk for chlamydia or gonorrhea. Ask your health care provider if you are at risk.  If you do not have HIV, but are at risk, it may be recommended that you take a prescription medicine daily to prevent HIV infection. This is called pre-exposure prophylaxis (PrEP). You are considered at risk if:  You are sexually active and do not regularly use condoms or know the HIV status of your partner(s).  You take drugs by  injection.  You are sexually active with a partner who has HIV. Talk with your health care provider about whether you are at high risk of being infected with HIV. If you choose to begin PrEP, you should first be tested for HIV. You should then be tested every 3 months for as long as you are taking PrEP. Pregnancy  If you are premenopausal and you may become pregnant, ask your health care provider about preconception counseling.  If you  may become pregnant, take 400 to 800 micrograms (mcg) of folic acid every day.  If you want to prevent pregnancy, talk to your health care provider about birth control (contraception). Osteoporosis and menopause  Osteoporosis is a disease in which the bones lose minerals and strength with aging. This can result in serious bone fractures. Your risk for osteoporosis can be identified using a bone density scan.  If you are 68 years of age or older, or if you are at risk for osteoporosis and fractures, ask your health care provider if you should be screened.  Ask your health care provider whether you should take a calcium or vitamin D supplement to lower your risk for osteoporosis.  Menopause may have certain physical symptoms and risks.  Hormone replacement therapy may reduce some of these symptoms and risks. Talk to your health care provider about whether hormone replacement therapy is right for you. Follow these instructions at home:  Schedule regular health, dental, and eye exams.  Stay current with your immunizations.  Do not use any tobacco products including cigarettes, chewing tobacco, or electronic cigarettes.  If you are pregnant, do not drink alcohol.  If you are breastfeeding, limit how much and how often you drink alcohol.  Limit alcohol intake to no more than 1 drink per day for nonpregnant women. One drink equals 12 ounces of beer, 5 ounces of wine, or 1 ounces of hard liquor.  Do not use street drugs.  Do not share needles.  Ask  your health care provider for help if you need support or information about quitting drugs.  Tell your health care provider if you often feel depressed.  Tell your health care provider if you have ever been abused or do not feel safe at home. This information is not intended to replace advice given to you by your health care provider. Make sure you discuss any questions you have with your health care provider. Document Released: 03/08/2011 Document Revised: 01/29/2016 Document Reviewed: 05/27/2015 Elsevier Interactive Patient Education  2017 [employer].

## 2017-01-06 NOTE — Progress Notes (Signed)
Pre visit review using our clinic review tool, if applicable. No additional management support is needed unless otherwise documented below in the visit note. 

## 2017-01-09 ENCOUNTER — Encounter: Payer: Self-pay | Admitting: Family

## 2017-01-09 DIAGNOSIS — Z0000 Encounter for general adult medical examination without abnormal findings: Secondary | ICD-10-CM | POA: Insufficient documentation

## 2017-01-09 DIAGNOSIS — N6310 Unspecified lump in the right breast, unspecified quadrant: Secondary | ICD-10-CM | POA: Insufficient documentation

## 2017-01-09 NOTE — Assessment & Plan Note (Signed)
CBE and pap performed. Advised tdap at local pharmacy. Encouraged continued exercise.screening labs ordered.

## 2017-01-09 NOTE — Assessment & Plan Note (Signed)
Repeat ordered. Patient understands to schedule.

## 2017-01-10 ENCOUNTER — Encounter: Payer: Self-pay | Admitting: Family

## 2017-01-10 LAB — CYTOLOGY - PAP
Diagnosis: NEGATIVE
HPV: NOT DETECTED

## 2017-02-03 ENCOUNTER — Ambulatory Visit: Payer: Self-pay | Admitting: Physician Assistant

## 2017-02-03 ENCOUNTER — Encounter: Payer: Self-pay | Admitting: Physician Assistant

## 2017-02-03 VITALS — BP 138/90 | HR 65 | Temp 98.6°F

## 2017-02-03 DIAGNOSIS — M62838 Other muscle spasm: Secondary | ICD-10-CM

## 2017-02-03 MED ORDER — BACLOFEN 10 MG PO TABS: 10.0000 mg | ORAL_TABLET | Freq: Three times a day (TID) | ORAL | 0 refills | Status: DC

## 2017-02-03 MED ORDER — VITAMIN D (ERGOCALCIFEROL) 1.25 MG (50000 UNIT) PO CAPS: 50000.0000 [IU] | ORAL_CAPSULE | ORAL | 4 refills | Status: DC

## 2017-02-03 MED ORDER — BUPROPION HCL ER (XL) 300 MG PO TB24: 300.0000 mg | ORAL_TABLET | Freq: Every day | ORAL | 6 refills | Status: DC

## 2017-02-03 NOTE — Progress Notes (Signed)
S: c/o left sided neck and shoulder pain, pain will radiate to left upper arm, no cp/sob/v; no known injury, states she recently started dating someone and her daughter (43 y/o) is having difficulty with this and its causing her to have a lot of stress, would like to go back on the wellbutrin as she misplaced hers, needs refill on vit d  O: vitals wnl, nad, lungs c t a, cv rrr, cspine is a little tender at c6, muscles are hard and spasmed throughout shoulders and upper back, into neck, left upper arm is tender to palp, full rom, grips = b/l, n/v intact  A: muscle spasms in neck and shoulders, anxiety  P: baclofen, wellbutrin xl 300mg  (increased dose from 150); refill on vit d

## 2017-02-14 ENCOUNTER — Ambulatory Visit: Payer: Self-pay | Admitting: Podiatry

## 2017-03-14 ENCOUNTER — Ambulatory Visit (INDEPENDENT_AMBULATORY_CARE_PROVIDER_SITE_OTHER): Payer: Managed Care, Other (non HMO) | Admitting: Podiatry

## 2017-03-14 ENCOUNTER — Ambulatory Visit (INDEPENDENT_AMBULATORY_CARE_PROVIDER_SITE_OTHER): Payer: Managed Care, Other (non HMO)

## 2017-03-14 VITALS — BP 111/75 | HR 71 | Temp 98.5°F | Resp 16

## 2017-03-14 DIAGNOSIS — M2012 Hallux valgus (acquired), left foot: Secondary | ICD-10-CM

## 2017-03-14 DIAGNOSIS — M722 Plantar fascial fibromatosis: Secondary | ICD-10-CM | POA: Diagnosis not present

## 2017-03-14 NOTE — Progress Notes (Signed)
Subjective:     Patient ID: Danielle Browning, female   DOB: 18-Aug-1976, 41 y.o.   MRN: [id number]  HPI this patient presents the office with chief complaint of a painful bunion on her left foot.  She says the bunion  is severe and is causing pain and discomfort walking and wearing shoes.  She says the bunion becomes red and inflamed.  She gives a history of having a bunion on her right foot, which was corrected by Dr. Patrice Paradise in [place].  She presents the office today for an evaluation of her bunion and desiring definitive surgical correction.  She also says she is having pain in her right heel.  She says she has been experiencing pain in her heel for weeks and the pain is worsening.  She says her pain level is 4 out of 10.  She says she was previously diagnosed with plantar fasciitis and bone spurs on both heels.  She presents for the office for an evaluation of her plantar fascial pain.   Review of Systems     Objective:   Physical Exam GENERAL APPEARANCE: Alert, conversant. Appropriately groomed. No acute distress.  VASCULAR: Pedal pulses are  palpable at  [facility] and PT bilateral.  Capillary refill time is immediate to all digits,  Normal temperature gradient.  Digital hair growth is present bilateral  NEUROLOGIC: sensation is normal to 5.07 monofilament at 5/5 sites bilateral.  Light touch is intact bilateral, Muscle strength normal.  MUSCULOSKELETAL: acceptable muscle strength, tone and stability bilateral.  Intrinsic muscluature intact bilateral.  Rectus appearance of foot and digits noted bilateral. Mild dprsomedial exostosis right foot.  Severe bunion 1st MPJ left foot.  Palpable pain at the insertion plantar fascia right heel.  DERMATOLOGIC: skin color, texture, and turgor are within normal limits.  No preulcerative lesions or ulcers  are seen, no interdigital maceration noted.  No open lesions present.  Digital nails are asymptomatic. No drainage noted. Scar presents over 1st MPJ  Right foot.         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  Calcification at the insertion of the plantar fascia bilaterally.  Severe dorsomedial exostosis first MPJ left foot.  Discussed the clinical findings with this patient and decided to recommend a surgical evaluation by Dr. Milinda Pointer for the correction of her severe bunion.  Patient was also prescribed power step insoles in an effort to relieve her plantar fascial pain.  She was also told she should discontinue her footgear and return to regular footgear in an effort to relieve her plantar fascial pain.   Gardiner Barefoot DPM

## 2017-04-01 ENCOUNTER — Encounter: Payer: Self-pay | Admitting: Certified Nurse Midwife

## 2017-04-01 ENCOUNTER — Ambulatory Visit (INDEPENDENT_AMBULATORY_CARE_PROVIDER_SITE_OTHER): Payer: Managed Care, Other (non HMO) | Admitting: Certified Nurse Midwife

## 2017-04-01 VITALS — BP 124/89 | HR 90 | Ht 61.0 in | Wt 273.4 lb

## 2017-04-01 DIAGNOSIS — Z30430 Encounter for insertion of intrauterine contraceptive device: Secondary | ICD-10-CM

## 2017-04-01 NOTE — Patient Instructions (Signed)

## 2017-04-01 NOTE — Progress Notes (Addendum)
   GYNECOLOGY OFFICE PROCEDURE NOTE  Kinslei Labine is a 41 y.o. G3P0 here for Mirena IUD insertion. No GYN concerns.  Last pap smear was on 01/06/2017 and was normal.  IUD Insertion Procedure Note Patient identified, informed consent performed, consent signed.   Discussed risks of irregular bleeding, cramping, infection, malpositioning or misplacement of the IUD outside the uterus which may require further procedure such as laparoscopy. Time out was performed.  She is not sexually active but is getting married.   Speculum placed in the vagina.  Cervix visualized.  Cleaned with Betadine x 2.  Grasped anteriorly with a single tooth tenaculum.  Uterus sounded to 7.5 cm.  Mirena IUD placed per manufacturer's recommendations.  Strings trimmed to 3 cm. Tenaculum was removed, good hemostasis noted.  Patient tolerated procedure well.   Patient was given post-procedure instructions.  She was advised to have backup contraception for one week.  Patient was also asked to check IUD strings periodically and follow up in 4 weeks for IUD check.   Philip Aspen, CNM

## 2017-04-04 ENCOUNTER — Encounter (INDEPENDENT_AMBULATORY_CARE_PROVIDER_SITE_OTHER): Payer: Managed Care, Other (non HMO) | Admitting: Podiatry

## 2017-04-04 NOTE — Progress Notes (Signed)
This encounter was created in error - please disregard.

## 2017-04-18 ENCOUNTER — Ambulatory Visit: Payer: Self-pay | Admitting: Family

## 2017-04-29 ENCOUNTER — Encounter: Payer: Managed Care, Other (non HMO) | Admitting: Certified Nurse Midwife

## 2017-05-04 ENCOUNTER — Encounter: Payer: Managed Care, Other (non HMO) | Admitting: Certified Nurse Midwife

## 2017-05-10 ENCOUNTER — Encounter: Payer: Self-pay | Admitting: Certified Nurse Midwife

## 2017-05-10 ENCOUNTER — Ambulatory Visit (INDEPENDENT_AMBULATORY_CARE_PROVIDER_SITE_OTHER): Payer: Managed Care, Other (non HMO) | Admitting: Certified Nurse Midwife

## 2017-05-10 VITALS — BP 103/73 | HR 64 | Wt 281.0 lb

## 2017-05-10 DIAGNOSIS — Z30431 Encounter for routine checking of intrauterine contraceptive device: Secondary | ICD-10-CM | POA: Diagnosis not present

## 2017-05-10 NOTE — Patient Instructions (Signed)

## 2017-05-10 NOTE — Progress Notes (Signed)
  GYNECOLOGY OFFICE PROGRESS NOTE  History:  41 y.o. G3P0 here today for today for IUD string check; Mirena IUD was placed  04/01/17. No complaints about the IUD, no concerning side effects.  The following portions of the patient's history were reviewed and updated as appropriate: allergies, current medications, past family history, past medical history, past social history, past surgical history and problem list. Last pap smear on 01/06/17  was normal, negative HRHPV.  Review of Systems:  Pertinent items are noted in HPI.   Objective:  Physical Exam Blood pressure 103/73, pulse 64, weight 281 lb (127.5 kg). CONSTITUTIONAL: Well-developed, well-nourished female in no acute distress.  HENT:  Normocephalic, atraumatic. External right and left ear normal. Oropharynx is clear and moist EYES: Conjunctivae and EOM are normal. No scleral icterus.  NECK: Normal range of motion, supple CARDIOVASCULAR: Normal heart rate noted RESPIRATORY: Effort and breath sounds normal, no problems with respiration noted ABDOMEN: Soft, no distention noted.   PELVIC: Normal appearing external genitalia; normal appearing vaginal mucosa and cervix.  IUD strings visualized, about 3 cm in length outside cervix.   Assessment & Plan:  Normal IUD check. Patient to keep IUD in place for five years; can come in for removal if she desires pregnancy within the next five years. Routine preventative health maintenance measures emphasized.   Philip Aspen, CNM

## 2017-05-23 ENCOUNTER — Ambulatory Visit: Payer: Managed Care, Other (non HMO)

## 2017-05-23 ENCOUNTER — Other Ambulatory Visit: Payer: Self-pay

## 2017-07-11 ENCOUNTER — Emergency Department: Payer: Managed Care, Other (non HMO)

## 2017-07-11 ENCOUNTER — Encounter: Payer: Self-pay | Admitting: Emergency Medicine

## 2017-07-11 ENCOUNTER — Emergency Department
Admission: EM | Admit: 2017-07-11 | Discharge: 2017-07-11 | Disposition: A | Discharge status: Home / Self Care | Payer: Managed Care, Other (non HMO) | Attending: Emergency Medicine | Admitting: Emergency Medicine

## 2017-07-11 ENCOUNTER — Ambulatory Visit: Payer: Self-pay | Admitting: Family Medicine

## 2017-07-11 ENCOUNTER — Telehealth: Payer: Self-pay | Admitting: Family

## 2017-07-11 ENCOUNTER — Other Ambulatory Visit: Payer: Self-pay

## 2017-07-11 ENCOUNTER — Ambulatory Visit (INDEPENDENT_AMBULATORY_CARE_PROVIDER_SITE_OTHER): Payer: Managed Care, Other (non HMO) | Admitting: Family

## 2017-07-11 VITALS — BP 132/96 | HR 89 | Temp 98.1°F | Ht 61.0 in | Wt 287.5 lb

## 2017-07-11 DIAGNOSIS — S0232XA Fracture of orbital floor, left side, initial encounter for closed fracture: Secondary | ICD-10-CM | POA: Diagnosis not present

## 2017-07-11 DIAGNOSIS — S5291XA Unspecified fracture of right forearm, initial encounter for closed fracture: Secondary | ICD-10-CM | POA: Diagnosis not present

## 2017-07-11 DIAGNOSIS — M79601 Pain in right arm: Secondary | ICD-10-CM | POA: Diagnosis present

## 2017-07-11 DIAGNOSIS — Z79899 Other long term (current) drug therapy: Secondary | ICD-10-CM | POA: Diagnosis not present

## 2017-07-11 DIAGNOSIS — M7989 Other specified soft tissue disorders: Secondary | ICD-10-CM

## 2017-07-11 DIAGNOSIS — W19XXXD Unspecified fall, subsequent encounter: Secondary | ICD-10-CM | POA: Diagnosis not present

## 2017-07-11 DIAGNOSIS — R2231 Localized swelling, mass and lump, right upper limb: Secondary | ICD-10-CM | POA: Insufficient documentation

## 2017-07-11 MED ORDER — HYDROCODONE-ACETAMINOPHEN 5-325 MG PO TABS: 1.0000 | ORAL_TABLET | Freq: Four times a day (QID) | ORAL | 0 refills | Status: DC | PRN

## 2017-07-11 NOTE — Patient Instructions (Addendum)
I am just so sorry this happened  Referral to ENT, ortho today  Norco as needed. Do not take tylenol as in the [place]  We will work on getting records from [facility] - please you do the same so I can see images.   Let me know if you need anything  you may go to walk in orthopedic clinic if you do not wont to wait on appointment  . Information below:  [facility] [address]  M-F 1-7:30pm  [phone]

## 2017-07-11 NOTE — ED Triage Notes (Signed)
Patient to ER for right upper inner arm. Patient originally fell down stairs, received fracture to right hand and arm. Was seen at [facility] and sent to ER for [place] to r/o clot. Patient states pain now is different than previous pain from fracture. Current pain has been present for 2 days. Patient has swelling to right arm and right breast, more than previously after fracture. Fracture occurred approx 1 week ago.

## 2017-07-11 NOTE — Telephone Encounter (Signed)
Copied from [place] #[id number]. Topic: General - Other >> Jul 11, 2017  4:25 PM Boyd Kerbs wrote: Reason for CRM:  Pt. Signed forms for [employer] and wants to know if Doctor was able to look at her records

## 2017-07-11 NOTE — Telephone Encounter (Signed)
Please advise 
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
## 2017-07-11 NOTE — Discharge Instructions (Signed)
Fortunately your ultrasound today was very normal.  To be 599% certain that you did not have a blood clot however he would require repeat ultrasound in 7 days.  Please return to the emergency department for any concerns whatsoever.  It was a pleasure to take care of you today, and thank you for coming to our emergency department.  If you have any questions or concerns before leaving please ask the nurse to grab me and I'm more than happy to go through your aftercare instructions again.  If you were prescribed any opioid pain medication today such as Norco, Vicodin, Percocet, morphine, hydrocodone, or oxycodone please make sure you do not drive when you are taking this medication as it can alter your ability to drive safely.  If you have any concerns once you are home that you are not improving or are in fact getting worse before you can make it to your follow-up appointment, please do not hesitate to call 911 and come back for further evaluation.  Darel Hong, MD  Results for orders placed or performed in visit on 01/11/17  HM PAP SMEAR  Result Value Ref Range   HM Pap smear normal and Negative    US Venous Img Upper Uni Right  Result Date: 07/11/2017 CLINICAL DATA:  Swelling/edema of the right arm with pain for 1 week EXAM: Right UPPER EXTREMITY VENOUS DOPPLER ULTRASOUND TECHNIQUE: Gray-scale sonography with graded compression, as well as color Doppler and duplex ultrasound were performed to evaluate the upper extremity deep venous system from the level of the subclavian vein and including the jugular, axillary, basilic, radial, ulnar and upper cephalic vein. Spectral Doppler was utilized to evaluate flow at rest and with distal augmentation maneuvers. COMPARISON:  None. FINDINGS: Contralateral Subclavian Vein: Respiratory phasicity is normal and symmetric with the symptomatic side. No evidence of thrombus. Normal compressibility. Internal Jugular Vein: No evidence of thrombus. Normal  compressibility, respiratory phasicity and response to augmentation. Subclavian Vein: No evidence of thrombus. Normal compressibility, respiratory phasicity and response to augmentation. Axillary Vein: No evidence of thrombus. Normal compressibility, respiratory phasicity and response to augmentation. Cephalic Vein: No evidence of thrombus. Normal compressibility, respiratory phasicity and response to augmentation. Basilic Vein: No evidence of thrombus. Normal compressibility, respiratory phasicity and response to augmentation. Brachial Veins: No evidence of thrombus. Normal compressibility, respiratory phasicity and response to augmentation. Radial Veins: No evidence of thrombus. Normal compressibility, respiratory phasicity and response to augmentation. Ulnar Veins: No evidence of thrombus. Normal compressibility, respiratory phasicity and response to augmentation. Venous Reflux:  None visualized. Other Findings:  None visualized. IMPRESSION: No evidence of DVT within the right upper extremity. Electronically Signed   By: Donavan Foil M.D.   On: 07/11/2017 21:30
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
## 2017-07-11 NOTE — Telephone Encounter (Signed)
We have yet to receive records.

## 2017-07-11 NOTE — Progress Notes (Signed)
Pre visit review using our clinic review tool, if applicable. No additional management support is needed unless otherwise documented below in the visit note. 

## 2017-07-14 ENCOUNTER — Telehealth: Payer: Self-pay | Admitting: Family

## 2017-07-14 NOTE — Telephone Encounter (Signed)
Danielle Browning is this something for you or me? Good either way. Not sure fully what needs to be done??

## 2017-07-14 NOTE — Telephone Encounter (Signed)
Please advise 

## 2017-07-14 NOTE — Telephone Encounter (Unsigned)
Copied from [place] [id number]. Topic: Referral - Question >> Jul 14, 2017 11:25 AM Neva Seat wrote:   [address] [facility] -ENT  [facility] - ENT has no records of a CT scan done on pt.   They tried to call pt with no answer. They Abigail Butts needs someone to call her back to discuss what needs to be done.

## 2017-07-15 NOTE — Telephone Encounter (Signed)
In epic app so cannot see all however the ENT I put in the referral is excellent with plastics, particularly As she a laceration above her eye  Dr Nicki Reaper recommended ENT I mentioned in referral. Explain to patient it should be ENT a referral as you state.  Does she have an appt yet??

## 2017-07-15 NOTE — Telephone Encounter (Signed)
lvm @ [facility] ENT stating pt did not have CT. Margaret-Do you know anything regarding a referral to plastics?

## 2017-07-15 NOTE — Telephone Encounter (Signed)
No not yet, I have been back and forth with [facility] ENT cause they was wanting to know if she did a CT. I will let her know the ENT will cover the plastic.

## 2017-07-15 NOTE — Telephone Encounter (Signed)
Copied from [place] [id number]. Topic: Referral - Request >> Jul 15, 2017 10:56 AM Pricilla Handler wrote: Reason for CRM: Patient called regarding a referral for Plastic Surgery. Patient stated that Dr. Vidal Schwalbe was going to send a referral to a plastic surgeon. Please call patient. Thank You!!!
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
## 2017-07-18 NOTE — Progress Notes (Signed)
Closing note due to patient not returning phone call 

## 2017-07-18 NOTE — Telephone Encounter (Signed)
We have yet to receive the records. Will close note due to no activity thus far.

## 2017-08-02 ENCOUNTER — Telehealth: Payer: Self-pay | Admitting: *Deleted
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
## 2017-08-03 ENCOUNTER — Ambulatory Visit: Payer: Self-pay | Admitting: Emergency Medicine

## 2017-08-03 VITALS — BP 130/70 | HR 84 | Temp 98.5°F | Resp 16

## 2017-08-03 DIAGNOSIS — S0232XA Fracture of orbital floor, left side, initial encounter for closed fracture: Secondary | ICD-10-CM

## 2017-08-03 NOTE — Telephone Encounter (Signed)
Attempted to call patient. Mailbox was full.
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
## 2017-08-03 NOTE — Telephone Encounter (Signed)
Patient says she is going to a clinic through her work to be evaluated and will call back if needed.

## 2017-08-03 NOTE — Progress Notes (Signed)
Referral for to [facility] ENT has been faxed per Rhonda's authorization.

## 2017-08-04 ENCOUNTER — Telehealth: Payer: Self-pay | Admitting: Internal Medicine

## 2017-08-04 NOTE — Progress Notes (Signed)
Patient has been referred to see Dr. Richardson Landry at [facility] on 08/19/17 at 2pm.  I have notified the patient and she has accepted the appointment.

## 2017-08-04 NOTE — Telephone Encounter (Signed)
Tried to reach patient by phone her mail box is full

## 2017-08-04 NOTE — Telephone Encounter (Signed)
I received a totally blank FMLA request for one of Danielle Browning's patients.  I cannot fill out FMLA on a patient I hve not seen

## 2017-08-09 ENCOUNTER — Ambulatory Visit: Payer: Managed Care, Other (non HMO) | Admitting: Internal Medicine

## 2017-08-09 ENCOUNTER — Encounter: Payer: Self-pay | Admitting: Internal Medicine

## 2017-08-09 VITALS — BP 98/74 | HR 87 | Temp 98.6°F | Ht 60.75 in | Wt 295.4 lb

## 2017-08-09 DIAGNOSIS — R946 Abnormal results of thyroid function studies: Secondary | ICD-10-CM | POA: Diagnosis not present

## 2017-08-09 DIAGNOSIS — R928 Other abnormal and inconclusive findings on diagnostic imaging of breast: Secondary | ICD-10-CM | POA: Insufficient documentation

## 2017-08-09 DIAGNOSIS — Z87898 Personal history of other specified conditions: Secondary | ICD-10-CM | POA: Diagnosis not present

## 2017-08-09 DIAGNOSIS — M542 Cervicalgia: Secondary | ICD-10-CM | POA: Insufficient documentation

## 2017-08-09 DIAGNOSIS — E559 Vitamin D deficiency, unspecified: Secondary | ICD-10-CM | POA: Diagnosis not present

## 2017-08-09 DIAGNOSIS — Z1231 Encounter for screening mammogram for malignant neoplasm of breast: Secondary | ICD-10-CM | POA: Diagnosis not present

## 2017-08-09 DIAGNOSIS — E669 Obesity, unspecified: Secondary | ICD-10-CM

## 2017-08-09 DIAGNOSIS — R269 Unspecified abnormalities of gait and mobility: Secondary | ICD-10-CM | POA: Diagnosis not present

## 2017-08-09 DIAGNOSIS — Z1159 Encounter for screening for other viral diseases: Secondary | ICD-10-CM | POA: Diagnosis not present

## 2017-08-09 MED ORDER — CYCLOBENZAPRINE HCL 5 MG PO TABS: ORAL_TABLET | ORAL | 0 refills | Status: DC
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
## 2017-08-09 NOTE — Patient Instructions (Signed)
Please get diagnostic mammogram right and screening mammogram left by 11/2017 order placed  Please get fasting labs no food only medication and water x 12 hours   Cervical Sprain A cervical sprain is a stretch or tear in one or more of the tough, cord-like tissues that connect bones (ligaments) in the neck. Cervical sprains can range from mild to severe. Severe cervical sprains can cause the spinal bones (vertebrae) in the neck to be unstable. This can lead to spinal cord damage and can result in serious nervous system problems. The amount of time that it takes for a cervical sprain to get better depends on the cause and extent of the injury. Most cervical sprains heal in 4-6 weeks. What are the causes? Cervical sprains may be caused by an injury (trauma), such as from a motor vehicle accident, a fall, or sudden forward and backward whipping movement of the head and neck (whiplash injury). Mild cervical sprains may be caused by wear and tear over time, such as from poor posture, sitting in a chair that does not provide support, or looking up or down for long periods of time. What increases the risk? The following factors may make you more likely to develop this condition:  Participating in activities that have a high risk of trauma to the neck. These include contact sports, auto racing, gymnastics, and diving.  Taking risks when driving or riding in a motor vehicle, such as speeding.  Having osteoarthritis of the spine.  Having poor strength and flexibility of the neck.  A previous neck injury.  Having poor posture.  Spending a lot of time in certain positions that put stress on the neck, such as sitting at a computer for long periods of time.  What are the signs or symptoms? Symptoms of this condition include:  Pain, soreness, stiffness, tenderness, swelling, or a burning sensation in the front, back, or sides of the neck.  Sudden tightening of neck muscles that you cannot control  (muscle spasms).  Pain in the shoulders or upper back.  Limited ability to move the neck.  Headache.  Dizziness.  Nausea.  Vomiting.  Weakness, numbness, or tingling in a hand or an arm.  Symptoms may develop right away after injury, or they may develop over a few days. In some cases, symptoms may go away with treatment and return (recur) over time. How is this diagnosed? This condition may be diagnosed based on:  Your medical history.  Your symptoms.  Any recent injuries or known neck problems that you have, such as arthritis in the neck.  A physical exam.  Imaging tests, such as: ? X-rays. ? MRI. ? CT scan.  How is this treated? This condition is treated by resting and icing the injured area and doing physical therapy exercises. Depending on the severity of your condition, treatment may also include:  Keeping your neck in place (immobilized) for periods of time. This may be done using: ? A cervical collar. This supports your chin and the back of your head. ? A cervical traction device. This is a sling that holds up your head. This removes weight and pressure from your neck, and it may help to relieve pain.  Medicines that help to relieve pain and inflammation.  Medicines that help to relax your muscles (muscle relaxants).  Surgery. This is rare.  Follow these instructions at home: If you have a cervical collar:  Wear it as told by your health care provider. Do not remove the collar unless  instructed by your health care provider.  Ask your health care provider before you make any adjustments to your collar.  If you have long hair, keep it outside of the collar.  Ask your health care provider if you can remove the collar for cleaning and bathing. If you are allowed to remove the collar for cleaning or bathing: ? Follow instructions from your health care provider about how to remove the collar safely. ? Clean the collar by wiping it with mild soap and water and  drying it completely. ? If your collar has removable pads, remove them every 1-2 days and wash them by hand with soap and water. Let them air-dry completely before you put them back in the collar. ? Check your skin under the collar for irritation or sores. If you see any, tell your health care provider. Managing pain, stiffness, and swelling  If directed, use a cervical traction device as told by your health care provider.  If directed, apply heat to the affected area before you do your physical therapy or as often as told by your health care provider. Use the heat source that your health care provider recommends, such as a moist heat pack or a heating pad. ? Place a towel between your skin and the heat source. ? Leave the heat on for 20-30 minutes. ? Remove the heat if your skin turns bright red. This is especially important if you are unable to feel pain, heat, or cold. You may have a greater risk of getting burned.  If directed, put ice on the affected area: ? Put ice in a plastic bag. ? Place a towel between your skin and the bag. ? Leave the ice on for 20 minutes, 2-3 times a day. Activity  Do not drive while wearing a cervical collar. If you do not have a cervical collar, ask your health care provider if it is safe to drive while your neck heals.  Do not drive or use heavy machinery while taking prescription pain medicine or muscle relaxants, unless your health care provider approves.  Do not lift anything that is heavier than 10 lb (4.5 kg) until your health care provider tells you that it is safe.  Rest as directed by your health care provider. Avoid positions and activities that make your symptoms worse. Ask your health care provider what activities are safe for you.  If physical therapy was prescribed, do exercises as told by your health care provider or physical therapist. General instructions  Take over-the-counter and prescription medicines only as told by your health care  provider.  Do not use any products that contain nicotine or tobacco, such as cigarettes and e-cigarettes. These can delay healing. If you need help quitting, ask your health care provider.  Keep all follow-up visits as told by your health care provider or physical therapist. This is important. How is this prevented? To prevent a cervical sprain from happening again:  Use and maintain good posture. Make any needed adjustments to your workstation to help you use good posture.  Exercise regularly as directed by your health care provider or physical therapist.  Avoid risky activities that may cause a cervical sprain.  Contact a health care provider if:  You have symptoms that get worse or do not get better after 2 weeks of treatment.  You have pain that gets worse or does not get better with medicine.  You develop new, unexplained symptoms.  You have sores or irritated skin on your neck from  wearing your cervical collar. Get help right away if:  You have severe pain.  You develop numbness, tingling, or weakness in any part of your body.  You cannot move a part of your body (you have paralysis).  You have neck pain along with: ? Severe dizziness. ? Headache. Summary  A cervical sprain is a stretch or tear in one or more of the tough, cord-like tissues that connect bones (ligaments) in the neck.  Cervical sprains may be caused by an injury (trauma), such as from a motor vehicle accident, a fall, or sudden forward and backward whipping movement of the head and neck (whiplash injury).  Symptoms may develop right away after injury, or they may develop over a few days.  This condition is treated by resting and icing the injured area and doing physical therapy exercises. This information is not intended to replace advice given to you by your health care provider. Make sure you discuss any questions you have with your health care provider. Document Released: 06/20/2007 Document  Revised: 04/21/2016 Document Reviewed: 04/21/2016 Elsevier Interactive Patient Education  2017 [employer].

## 2017-08-10 NOTE — Telephone Encounter (Signed)
Tried calling patient to advise we have FLMA paperwork for her to pick up , her voicemail box is full .  Unable to leave message.  Dr Terese Door, MD deffered paperwork to be completed by specialist as she has upcoming appointments with them on 12/13 with [facility] and 12/19 [facility].   Will place paperwork up front for pick up.Left voice mail to call back, HIPPA complaint to confirm .

## 2017-08-24 ENCOUNTER — Telehealth: Payer: Self-pay

## 2017-08-24 NOTE — Telephone Encounter (Signed)
Spoke with pt to let her know that we can not email paperwork due to [place]. Explained to pt that we fax it, mail, or she may come by and pick it up. The pt stated that she isn't able to pick it up, so she gave a fax number to fax paperwork to.

## 2017-08-24 NOTE — Telephone Encounter (Signed)
Attempted to call pt back. to see if she is aware that the [facility] paperwork so not filled out and that it would need to be filled out by her specialist. Pt's mailbox is full.   [facility] may speak with pt.

## 2017-08-24 NOTE — Telephone Encounter (Signed)
Copied from [place] [id number]. Topic: Inquiry >> Aug 24, 2017 11:56 AM Ahmed Prima L wrote: Reason for CRM:  Pt said she had some FMLA papers she was suppose to pick up and she hasnt been able to pick them up. She is wanting to know if they be scanned and emailed to her. Her email is [email or web address]

## 2017-08-29 NOTE — Telephone Encounter (Signed)
Tried to reach patient by phone her mail box is full

## 2017-09-02 NOTE — Telephone Encounter (Signed)
Pt is aware. Erasmo Downer had already spoken with the pt about this.

## 2017-09-08 NOTE — Telephone Encounter (Signed)
Spoke with patient and she has gotten FLMA paperwork.

## 2017-10-31 ENCOUNTER — Encounter: Payer: Self-pay | Admitting: Family Medicine

## 2017-10-31 ENCOUNTER — Ambulatory Visit: Payer: Self-pay | Admitting: Family Medicine

## 2017-10-31 VITALS — BP 130/82 | HR 66 | Temp 98.3°F | Resp 16 | Ht 61.0 in | Wt 300.0 lb

## 2017-10-31 DIAGNOSIS — Z299 Encounter for prophylactic measures, unspecified: Secondary | ICD-10-CM

## 2017-10-31 NOTE — Progress Notes (Signed)
Subjective: Annual biometrics screening  Patient has a history of migraine headaches, GERD, IBS.  Patient's primary care provider is Mable Paris, who manages these chronic conditions.  Patient reports a fall in October 2018, with a right radial head fracture, thumb and hand pain sprain, cervical spine pain, and left orbital fracture.  Patient has been following up with orthopedics and plastic surgery for this.  Patient denies any functional limitations from the fall or her chronic medical conditions that interfere with work.  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  Patient also has been seeing an ophthalmologist for some double vision in her left eye, which has improved.  Patient also reports weight gain over the last 6 months of approximately 20 pounds due to the stress of her younger sister and younger brothers deaths.  She reports this is due to "stress eating".  Accompanying that she reports some mild lower extremity edema bilaterally.  Denies chest pain, shortness of breath, palpitations, dizziness.  Patient's sister died of colon cancer at the age of 58, patient is under surveillance for this with her primary care provider. Patient denies any other issues or concerns.   Review of Systems Constitutional: Unremarkable.  HEENT: Denies dizziness, issues with hearing, vision problems other than listed above.  Gastrointestinal: Reports occasional diarrhea related to IBS. Denies any other issues with bowel or bladder.  Respiratory: Unremarkable.   Cardiovascular: Unremarkable.  Musculoskeletal: No arthralgias, myalgias, joint swelling, joint stiffness, back pain, neck pain. ROS otherwise negative.   Objective  Physical Exam General: Awake, alert and oriented. No acute distress. Well developed, hydrated and nourished. Appears stated age.   HEENT: PERRLA, EOM intact. Supple neck without adenopathy. Sclera is non-icteric. The ear canal is clear  without discharge. The tympanic membrane is normal in appearance with normal landmarks and cone of light. Nasal mucosa is pink and moist. Oral mucosa is pink and moist. The pharynx is normal in appearance without tonsillar swelling or exudates.  Skin: Skin in warm, dry and intact without rashes or lesions. Appropriate color for ethnicity. Cardiac: Heart rate and rhythm are normal. No murmurs, gallops, or rubs are auscultated.  Respiratory: The chest wall is symmetric and without deformity. No signs of respiratory distress. Lung sounds are clear in all lobes bilaterally without rales, ronchi, or wheezes.  Abdominal: Abdomen is soft, symmetric, and non-tender without distention. No masses, hepatomegaly, or splenomegaly are noted.  Spine: Neck and back are without deformity. Curvature of the cervical, thoracic, and lumbar spine are within normal limits. Posture is upright, gait is smooth, steady, and within normal limits. No tenderness noted on palpation of the spinous processes. Spinous processes are midline. No discomfort is noted with flexion, extension, and side-to-side rotation of the cervical spine, full range of motion is noted. Full range of motion including flexion, extension, and side-to-side rotation of the thoracic and lumbar spine are noted and without discomfort.  Extremities: Full range of motion is noted to all major joints. Steady gait noted.  Neurological: The patient is awake, alert and oriented to person, place, and time with normal speech. Reflexes 2+ bilaterally. Memory is normal and thought processes intact. No gait abnormalities are appreciated.  Psychiatric: Appropriate mood and affect.   Assessment Annual biometrics screen  Plan  Labs pending. Encouraged routine visits with primary care provider.  Encourage patient to discuss weight, peripheral edema, IBS control with primary care provider.

## 2017-11-01 LAB — CMP12+LP+TP+TSH+6AC+CBC/D/PLT
A/G RATIO: 1.2 (ref 1.2–2.2)
ALT: 14 IU/L (ref 0–32)
AST: 15 IU/L (ref 0–40)
Albumin: 3.7 g/dL (ref 3.5–5.5)
Alkaline Phosphatase: 81 IU/L (ref 39–117)
BASOS ABS: 0 10*3/uL (ref 0.0–0.2)
BUN/Creatinine Ratio: 19 (ref 9–23)
BUN: 12 mg/dL (ref 6–24)
Basos: 0 %
Bilirubin Total: 0.3 mg/dL (ref 0.0–1.2)
CHOLESTEROL TOTAL: 178 mg/dL (ref 100–199)
CREATININE: 0.63 mg/dL (ref 0.57–1.00)
Calcium: 8.9 mg/dL (ref 8.7–10.2)
Chloride: 102 mmol/L (ref 96–106)
Chol/HDL Ratio: 3.8 ratio (ref 0.0–4.4)
EOS (ABSOLUTE): 0.2 10*3/uL (ref 0.0–0.4)
Eos: 2 %
Estimated CHD Risk: 0.7 times avg. (ref 0.0–1.0)
Free Thyroxine Index: 2.1 (ref 1.2–4.9)
GFR, EST AFRICAN AMERICAN: 127 mL/min/{1.73_m2} (ref >59)
GFR, EST NON AFRICAN AMERICAN: 110 mL/min/{1.73_m2} (ref >59)
GGT: 16 IU/L (ref 0–60)
Globulin, Total: 3 g/dL (ref 1.5–4.5)
Glucose: 102 mg/dL — ABNORMAL HIGH (ref 65–99)
HDL: 47 mg/dL (ref >39)
Hematocrit: 39.2 % (ref 34.0–46.6)
Hemoglobin: 13.2 g/dL (ref 11.1–15.9)
IRON: 60 ug/dL (ref 27–159)
Immature Grans (Abs): 0 10*3/uL (ref 0.0–0.1)
Immature Granulocytes: 0 %
LDH: 194 IU/L (ref 119–226)
LDL Calculated: 112 mg/dL — ABNORMAL HIGH (ref 0–99)
LYMPHS: 26 %
Lymphocytes Absolute: 2.1 10*3/uL (ref 0.7–3.1)
MCH: 28.8 pg (ref 26.6–33.0)
MCHC: 33.7 g/dL (ref 31.5–35.7)
MCV: 85 fL (ref 79–97)
MONOCYTES: 5 %
MONOS ABS: 0.4 10*3/uL (ref 0.1–0.9)
Neutrophils Absolute: 5.5 10*3/uL (ref 1.4–7.0)
Neutrophils: 67 %
PHOSPHORUS: 2.7 mg/dL (ref 2.5–4.5)
PLATELETS: 286 10*3/uL (ref 150–379)
Potassium: 4.5 mmol/L (ref 3.5–5.2)
RBC: 4.59 x10E6/uL (ref 3.77–5.28)
RDW: 13.7 % (ref 12.3–15.4)
Sodium: 140 mmol/L (ref 134–144)
T3 Uptake Ratio: 19 % — ABNORMAL LOW (ref 24–39)
T4 TOTAL: 11 ug/dL (ref 4.5–12.0)
TRIGLYCERIDES: 97 mg/dL (ref 0–149)
TSH: 1.99 u[IU]/mL (ref 0.450–4.500)
Total Protein: 6.7 g/dL (ref 6.0–8.5)
Uric Acid: 6.6 mg/dL (ref 2.5–7.1)
VLDL Cholesterol Cal: 19 mg/dL (ref 5–40)
WBC: 8.3 10*3/uL (ref 3.4–10.8)

## 2017-11-01 NOTE — Progress Notes (Signed)
Will you call this patient regarding her blood work and tell her that everything looks good but that her fasting blood sugar was slightly elevated at 102?  The normal range for this is between 65 and 99.  This would be considered normal if this patient had not fasted prior her blood being taken.  This is something I would like her to follow-up with her primary care provider for in the next few weeks because in her medical history it lists prediabetes, which would correlate with this finding. Also, her LDL ("bad cholesterol") is elevated at 112.  Normal range is 0 and 99.  This is improved from last year though, which was 126.  Just wanted her to be aware of this so that she could discuss this with her primary care provider as well.

## 2018-01-10 ENCOUNTER — Ambulatory Visit: Payer: Self-pay | Admitting: Family Medicine

## 2018-01-10 VITALS — BP 136/76 | HR 92 | Temp 98.0°F | Resp 20

## 2018-01-10 DIAGNOSIS — J019 Acute sinusitis, unspecified: Secondary | ICD-10-CM

## 2018-01-10 DIAGNOSIS — H6983 Other specified disorders of Eustachian tube, bilateral: Secondary | ICD-10-CM

## 2018-01-10 DIAGNOSIS — J029 Acute pharyngitis, unspecified: Secondary | ICD-10-CM

## 2018-01-10 LAB — POCT RAPID STREP A (OFFICE): RAPID STREP A SCREEN: NEGATIVE

## 2018-01-10 MED ORDER — FLUTICASONE PROPIONATE 50 MCG/ACT NA SUSP: 2.0000 | Freq: Every day | NASAL | 0 refills | Status: DC

## 2018-01-10 MED ORDER — DOXYCYCLINE HYCLATE 100 MG PO TABS: 100.0000 mg | ORAL_TABLET | Freq: Two times a day (BID) | ORAL | 0 refills | Status: AC

## 2018-01-10 NOTE — Progress Notes (Signed)
n

## 2018-01-10 NOTE — Progress Notes (Signed)
Subjective: Facial pressure     Danielle Browning is a 42 y.o. female who presents for evaluation of left-sided facial pressure, nasal congestion with purulent discharge, productive cough with purulent sputum, fatigue, bilateral ear pain, headache, body aches, sore throat, and chills for 2 days.  Patient endorses worsening left-sided facial pressure and swelling overlying her left maxillary sinus.  Patient reports her son was diagnosed yesterday with strep throat.  Denies any other sick contacts.  Patient reports symptoms appear to be getting worse.  Patient able to eat and drink without difficulty. Treatment to date: DayQuil and NyQuil.  Denies rash, nausea, vomiting, diarrhea, SOB, wheezing, chest or back pain, difficulty swallowing, confusion, fever, or initial improvement and then worsening of symptoms. History of smoking, asthma, COPD: Negative. History of recurrent sinus and/or lung infections: Denies.  Reports usually getting 1 sinus infection each year. Patient denies being on any medications currently. Antibiotic use in the last 3 months: Patient denies.   Review of Systems Pertinent items noted in HPI and remainder of comprehensive ROS otherwise negative.     Objective:   Physical Exam General: Awake, alert, and oriented. No acute distress. Well developed, hydrated and nourished. Appears stated age. Nontoxic appearance.  HEENT:  PND noted.  Mild erythema to posterior oropharynx.  No edema or exudates of pharynx or tonsils. No erythema or bulging of TM.  Air/fluid level to bilateral TMs, otherwise normal.  Mild erythema/edema to nasal mucosa.  Left maxillary and frontal sinus tenderness.  Remainder of sinuses nontender. Supple neck without adenopathy. Cardiac: Heart rate and rhythm are normal. No murmurs, gallops, or rubs are auscultated. S1 and S2 are heard and are of normal intensity.  Respiratory: No signs of respiratory distress. Lungs clear. No tachypnea. Able to speak in full sentences  without dyspnea. Nonlabored respirations.  Skin: Skin is warm, dry and intact. Appropriate color for ethnicity. No cyanosis noted.   Diagnostic Results: Rapid strep negative.  Assessment:   Sinusitis  Eustachian tube dysfunction  Plan:    Discussed the diagnosis and treatment of sinusitis. Discussed the importance of avoiding unnecessary antibiotic therapy. Suggested symptomatic OTC remedies. Nasal saline spray for congestion.   Provided prescription for doxycycline and educated patient regarding indications to get this filled. Prescribed Flonase for ETD. Discussed side/adverse effects of all medications prescribed. Discussed red flag symptoms and circumstances with which to seek medical care.   New Prescriptions   DOXYCYCLINE (VIBRA-TABS) 100 MG TABLET    Take 1 tablet (100 mg total) by mouth 2 (two) times daily for 7 days.   FLUTICASONE (FLONASE) 50 MCG/ACT NASAL SPRAY    Place 2 sprays into both nostrils daily.

## 2018-02-02 ENCOUNTER — Telehealth: Payer: Self-pay | Admitting: Family

## 2018-02-02 DIAGNOSIS — R921 Mammographic calcification found on diagnostic imaging of breast: Secondary | ICD-10-CM

## 2018-02-02 NOTE — Telephone Encounter (Signed)
Call pt-   Your mammogram appears due. Please advise her to schedle

## 2018-02-02 NOTE — Telephone Encounter (Signed)
Call pt Reviewing chart and concerned she may not have repeated a right breast ultrasound- would have been due 05/2017.   She would need a right diagnositc mammogram and then also her annual.   Is that what she understood as well?  Does she need [place] to order ?

## 2018-02-06 NOTE — Telephone Encounter (Signed)
Left voice mail for patient to call back ok for [facility] to speak to patient    

## 2018-02-09 NOTE — Telephone Encounter (Signed)
Spoke with patient she hasn't repeated none to the studies. Please order and I will schedule , she wants in July in am. Thanks

## 2018-02-10 NOTE — Telephone Encounter (Signed)
Cyril Mourning, I have Ordered . Let patient know  Melissa, do you schedule the diag and screening mammogram in this case?

## 2018-02-13 NOTE — Telephone Encounter (Signed)
I got it scheduled for her and letting her know when it has been scheduled for.

## 2018-02-14 NOTE — Telephone Encounter (Signed)
Patient has been scheduled for mammogram

## 2018-02-15 NOTE — Telephone Encounter (Signed)
Yes I left her a vm

## 2018-02-15 NOTE — Telephone Encounter (Signed)
Thanks Patient aware of study/date? Just want to be sure :)

## 2018-03-03 ENCOUNTER — Ambulatory Visit
Admission: RE | Admit: 2018-03-03 | Discharge: 2018-03-03 | Disposition: A | Discharge status: Home / Self Care | Payer: Managed Care, Other (non HMO) | Source: Ambulatory Visit | Attending: Family | Admitting: Family

## 2018-03-03 DIAGNOSIS — R921 Mammographic calcification found on diagnostic imaging of breast: Secondary | ICD-10-CM | POA: Insufficient documentation

## 2018-04-18 ENCOUNTER — Ambulatory Visit: Payer: Managed Care, Other (non HMO) | Admitting: Family Medicine

## 2018-04-18 ENCOUNTER — Encounter: Payer: Self-pay | Admitting: Family Medicine

## 2018-04-18 VITALS — BP 118/76 | HR 70 | Temp 98.2°F | Resp 14 | Wt 289.5 lb

## 2018-04-18 DIAGNOSIS — G44209 Tension-type headache, unspecified, not intractable: Secondary | ICD-10-CM

## 2018-04-18 DIAGNOSIS — R5383 Other fatigue: Secondary | ICD-10-CM | POA: Diagnosis not present

## 2018-04-18 DIAGNOSIS — E559 Vitamin D deficiency, unspecified: Secondary | ICD-10-CM

## 2018-04-18 LAB — CBC
HCT: 42.4 % (ref 36.0–46.0)
Hemoglobin: 14.5 g/dL (ref 12.0–15.0)
MCHC: 34.1 g/dL (ref 30.0–36.0)
MCV: 86.4 fl (ref 78.0–100.0)
PLATELETS: 332 10*3/uL (ref 150.0–400.0)
RBC: 4.91 Mil/uL (ref 3.87–5.11)
RDW: 13.8 % (ref 11.5–15.5)
WBC: 6.9 10*3/uL (ref 4.0–10.5)

## 2018-04-18 LAB — BASIC METABOLIC PANEL
BUN: 14 mg/dL (ref 6–23)
CHLORIDE: 101 meq/L (ref 96–112)
CO2: 29 meq/L (ref 19–32)
Calcium: 9.2 mg/dL (ref 8.4–10.5)
Creatinine, Ser: 0.65 mg/dL (ref 0.40–1.20)
GFR: 105.07 mL/min (ref >60.00)
GLUCOSE: 110 mg/dL — AB (ref 70–99)
POTASSIUM: 4.3 meq/L (ref 3.5–5.1)
SODIUM: 137 meq/L (ref 135–145)

## 2018-04-18 LAB — B12 AND FOLATE PANEL
FOLATE: 6.9 ng/mL (ref >5.9)
Vitamin B-12: 405 pg/mL (ref 211–911)

## 2018-04-18 LAB — VITAMIN D 25 HYDROXY (VIT D DEFICIENCY, FRACTURES): VITD: 16.15 ng/mL — ABNORMAL LOW (ref 30.00–100.00)

## 2018-04-18 NOTE — Patient Instructions (Signed)
Great to meet you!  Continue to work on [employer] and exercise

## 2018-04-18 NOTE — Progress Notes (Signed)
Subjective:    Patient ID: Danielle Browning, female    DOB: 02/28/1976, 42 y.o.   MRN: [id number]  HPI Patient presents to clinic fatigue for 2 months.  States she gets a good amount of sleep every night.  Patient does not believe she snores and has not been told she snores.  States she could sleep almost all day long and never feel rested.  Denies any increased stress or feeling down -states she had 2 siblings passed away last year, overall feels well with this.  Otherwise states life is normal.  Also reports headache -describes headache as a dull ache around the top of head.  She will use ibuprofen with decent success with pain relief, and taking a nap will help with pain relief as well.  Patient also has been working on weight loss.  States she has been doing the keto diet and has lost maybe 20 pounds.  Patient feels she has maybe hit a wall and is concerned that something else may be wrong because she is having difficulty losing more weight.  Patient states she does not exercise often.  Did change her desk at work to a standing desk and this is helped her to be more active.  Patient Active Problem List   Diagnosis Date Noted  . Cervicalgia 08/09/2017  . Abnormal thyroid function test 08/09/2017  . Vitamin D deficiency 08/09/2017  . Abnormal mammogram 08/09/2017  . Abnormality of gait 08/09/2017  . History of prediabetes 08/09/2017  . Breast mass, right 01/09/2017  . Routine physical examination 01/09/2017   Social History   Tobacco Use  . Smoking status: Never Smoker  . Smokeless tobacco: Never Used  . Tobacco comment: parents smoked in house when she was a child  Substance Use Topics  . Alcohol use: No    Alcohol/week: 0.0 standard drinks   Review of Systems  Constitutional: Negative for chills, POSITIVE for fatigue.  HENT: Negative for congestion, ear pain, sinus pain and sore throat.   Eyes: Negative.   Respiratory: Negative for cough, shortness of breath and wheezing.     Cardiovascular: Negative for chest pain, palpitations and leg swelling.  Gastrointestinal: Negative for abdominal pain, diarrhea, nausea and vomiting.  Genitourinary: Negative for dysuria, frequency and urgency.  Musculoskeletal: Negative for arthralgias and myalgias.  Skin: Negative for color change, pallor and rash.  Neurological: Negative for syncope, light-headedness. +headaches.  Psychiatric/Behavioral: The patient is not nervous/anxious.    Objective:   Physical Exam  Constitutional: She is oriented to person, place, and time. She appears well-developed and well-nourished. No distress.  HENT:  Head: Normocephalic and atraumatic.  Eyes: Pupils are equal, round, and reactive to light. EOM are normal. No scleral icterus.  Neck: Normal range of motion. Neck supple. No tracheal deviation present.  Cardiovascular: Normal rate, regular rhythm and normal heart sounds.  Pulmonary/Chest: Effort normal and breath sounds normal. No respiratory distress. Neurological: She is alert and oriented to person, place, and time.  Gait normal  Skin: Skin is warm and dry. No pallor.  Psychiatric: She has a normal mood and affect. Her behavior is normal. Thought content normal.  Nursing note and vitals reviewed.  Vitals:   04/18/18 0838  BP: 118/76  Pulse: 70  Resp: 14  Temp: 98.2 F (36.8 C)  SpO2: 95%   Filed Weights   04/18/18 0838  Weight: 289 lb 8 oz (131.3 kg)      Assessment & Plan:    A total of 25  minutes were spent face-to-face with the patient during this encounter and over half of that time was spent on counseling and coordination of care. The patient was counseled on weight loss strategy, possible reasons for fatigue.    Fatigue- we will get CBC, edema, thyroid panel, vitamin D, B12 to further evaluate.  We can also consider sleep apnea study in future if lab work comes back normal.  Morbid obesity- continue to work on healthy diet and exercise.  Discussed different  options for food choices, and also discussed how weight loss requires a lifestyle change not just cutting out certain food groups. A good weight loss program is weight watchers helps to continue with portion control.  Eating diet high in lean protein veggies and lower in carbs and refined sugars.  Regular physical activity also can help boost weight loss.  Try to do 20 to 30 minutes of exercise at least 4-5 times a week.  Patient also given the name of the weight loss doctor within [facility] health system, Danielle Browning, patient states she will look into this to see if it would be covered under her insurance.  Headache- this could be related to possible low vitamin D or B12.  Recommend patient try an Excedrin tablet to relieve headache pain.  Patient to keep up the good water intake.  We will base need for next follow-up appointment off of lab work results.  Make you aware of lab results when available.

## 2018-04-19 ENCOUNTER — Telehealth: Payer: Self-pay

## 2018-04-19 MED ORDER — VITAMIN D (ERGOCALCIFEROL) 1.25 MG (50000 UNIT) PO CAPS: 50000.0000 [IU] | ORAL_CAPSULE | ORAL | 1 refills | Status: DC

## 2018-04-19 NOTE — Telephone Encounter (Signed)
Copied from [place] [id number]. Topic: General - Other >> Apr 19, 2018  2:52 PM Yvette Rack wrote: Reason for CRM: Pt returned call to office. Pt requests call back.

## 2018-04-19 NOTE — Addendum Note (Signed)
Addended by: Philis Nettle on: 04/19/2018 08:04 AM   Modules accepted: Orders

## 2018-04-20 LAB — THYROID PANEL WITH TSH
Free Thyroxine Index: 3.3 (ref 1.4–3.8)
T3 Uptake: 32 % (ref 22–35)
T4, Total: 10.4 ug/dL (ref 5.1–11.9)
TSH: 1.73 mIU/L

## 2018-06-08 ENCOUNTER — Encounter: Payer: Self-pay | Admitting: Emergency Medicine

## 2018-06-08 ENCOUNTER — Other Ambulatory Visit: Payer: Self-pay

## 2018-06-08 ENCOUNTER — Emergency Department: Payer: Managed Care, Other (non HMO)

## 2018-06-08 ENCOUNTER — Ambulatory Visit
Admission: EM | Admit: 2018-06-08 | Discharge: 2018-06-08 | Disposition: A | Discharge status: Home / Self Care | Payer: Managed Care, Other (non HMO) | Source: Home / Self Care | Attending: Family Medicine | Admitting: Family Medicine

## 2018-06-08 ENCOUNTER — Ambulatory Visit: Payer: Managed Care, Other (non HMO)

## 2018-06-08 DIAGNOSIS — R1030 Lower abdominal pain, unspecified: Secondary | ICD-10-CM

## 2018-06-08 DIAGNOSIS — N3091 Cystitis, unspecified with hematuria: Secondary | ICD-10-CM | POA: Insufficient documentation

## 2018-06-08 DIAGNOSIS — R310 Gross hematuria: Secondary | ICD-10-CM

## 2018-06-08 DIAGNOSIS — R1031 Right lower quadrant pain: Secondary | ICD-10-CM | POA: Diagnosis present

## 2018-06-08 LAB — URINALYSIS, COMPLETE (UACMP) WITH MICROSCOPIC
BACTERIA UA: NONE SEEN
Bacteria, UA: NONE SEEN
Bilirubin Urine: NEGATIVE
GLUCOSE, UA: NEGATIVE mg/dL
Ketones, ur: 5 mg/dL — AB
Nitrite: NEGATIVE
PH: 6 (ref 5.0–8.0)
Protein, ur: 100 mg/dL — AB
RBC / HPF: >50 RBC/hpf (ref 0–5)
RBC / HPF: >50 RBC/hpf — ABNORMAL HIGH (ref 0–5)
SPECIFIC GRAVITY, URINE: 1.016 (ref 1.005–1.030)
Squamous Epithelial / LPF: NONE SEEN (ref 0–5)

## 2018-06-08 LAB — CBC WITH DIFFERENTIAL/PLATELET
BASOS ABS: 0.2 10*3/uL — AB (ref 0–0.1)
Basophils Relative: 1 %
Eosinophils Absolute: 0.2 10*3/uL (ref 0–0.7)
Eosinophils Relative: 2 %
HEMATOCRIT: 41.5 % (ref 35.0–47.0)
Hemoglobin: 14.3 g/dL (ref 12.0–16.0)
LYMPHS ABS: 3.3 10*3/uL (ref 1.0–3.6)
Lymphocytes Relative: 27 %
MCH: 29.7 pg (ref 26.0–34.0)
MCHC: 34.5 g/dL (ref 32.0–36.0)
MCV: 86.2 fL (ref 80.0–100.0)
MONO ABS: 0.6 10*3/uL (ref 0.2–0.9)
Monocytes Relative: 5 %
NEUTROS ABS: 8.2 10*3/uL — AB (ref 1.4–6.5)
NEUTROS PCT: 65 %
Platelets: 345 10*3/uL (ref 150–440)
RBC: 4.81 MIL/uL (ref 3.80–5.20)
RDW: 13.7 % (ref 11.5–14.5)
WBC: 12.4 10*3/uL — ABNORMAL HIGH (ref 3.6–11.0)

## 2018-06-08 LAB — COMPREHENSIVE METABOLIC PANEL
ALT: 19 U/L (ref 0–44)
AST: 21 U/L (ref 15–41)
Albumin: 4.1 g/dL (ref 3.5–5.0)
Alkaline Phosphatase: 74 U/L (ref 38–126)
Anion gap: 9 (ref 5–15)
BILIRUBIN TOTAL: 0.4 mg/dL (ref 0.3–1.2)
BUN: 17 mg/dL (ref 6–20)
CO2: 25 mmol/L (ref 22–32)
CREATININE: 0.69 mg/dL (ref 0.44–1.00)
Calcium: 9.1 mg/dL (ref 8.9–10.3)
Chloride: 104 mmol/L (ref 98–111)
Glucose, Bld: 96 mg/dL (ref 70–99)
POTASSIUM: 3.7 mmol/L (ref 3.5–5.1)
Sodium: 138 mmol/L (ref 135–145)
TOTAL PROTEIN: 7.7 g/dL (ref 6.5–8.1)

## 2018-06-08 LAB — POCT PREGNANCY, URINE: Preg Test, Ur: NEGATIVE
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
## 2018-06-08 NOTE — Discharge Instructions (Signed)
Go straight to the ER.  Patient has Gross hematuria. No pyuria to suggest UTI. No CT available at [facility].  Dr. Lacinda Axon

## 2018-06-08 NOTE — ED Triage Notes (Signed)
Pt c/o dysuria, hematuria, frequency and lower back pain x 1 week.

## 2018-06-08 NOTE — ED Triage Notes (Signed)
Patient c/o right flank pain and lower abdominal pain X 1 week. Patient c/o hematuria beginning today. Patient c/o problems initiating/stopping urinary stream.

## 2018-06-09 ENCOUNTER — Emergency Department
Admission: EM | Admit: 2018-06-09 | Discharge: 2018-06-09 | Disposition: A | Discharge status: Home / Self Care | Payer: Managed Care, Other (non HMO) | Attending: Emergency Medicine | Admitting: Emergency Medicine

## 2018-06-09 DIAGNOSIS — N3091 Cystitis, unspecified with hematuria: Secondary | ICD-10-CM

## 2018-06-09 MED ORDER — CIPROFLOXACIN HCL 500 MG PO TABS: 500.0000 mg | ORAL_TABLET | Freq: Two times a day (BID) | ORAL | 0 refills | Status: AC

## 2018-06-09 MED ORDER — KETOROLAC TROMETHAMINE 30 MG/ML IJ SOLN
30.0000 mg | Freq: Once | INTRAMUSCULAR | Status: AC
  Administered 2018-06-09: 30 mg via INTRAVENOUS
  Filled 2018-06-09: qty 1

## 2018-06-09 MED ORDER — PHENAZOPYRIDINE HCL 100 MG PO TABS
95.0000 mg | ORAL_TABLET | Freq: Once | ORAL | Status: AC
  Administered 2018-06-09: 100 mg via ORAL

## 2018-06-09 MED ORDER — PHENAZOPYRIDINE HCL 100 MG PO TABS: 100.0000 mg | ORAL_TABLET | Freq: Three times a day (TID) | ORAL | 0 refills | Status: AC | PRN

## 2018-06-09 MED ORDER — CIPROFLOXACIN HCL 500 MG PO TABS
500.0000 mg | ORAL_TABLET | Freq: Once | ORAL | Status: AC
  Administered 2018-06-09: 500 mg via ORAL
  Filled 2018-06-09: qty 1

## 2018-06-09 MED ORDER — MORPHINE SULFATE (PF) 2 MG/ML IV SOLN
2.0000 mg | Freq: Once | INTRAVENOUS | Status: AC
  Administered 2018-06-09: 2 mg via INTRAVENOUS
  Filled 2018-06-09: qty 1

## 2018-06-09 MED ORDER — PHENAZOPYRIDINE HCL 200 MG PO TABS
ORAL_TABLET | ORAL | Status: AC
  Administered 2018-06-09: 100 mg via ORAL
  Filled 2018-06-09: qty 1
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
## 2018-06-09 NOTE — ED Notes (Addendum)
Pt noted that she was urinating frequently all day and when she wiped she could see blood. Pt states that around 5:00pm yesterday her right flank area and lower abdominal area were painful. Family at bedside. Dr. Owens Shark at bedside

## 2018-06-10 LAB — URINE CULTURE: Culture: 60000 — AB

## 2018-06-11 LAB — URINE CULTURE

## 2018-06-12 ENCOUNTER — Telehealth (HOSPITAL_COMMUNITY): Payer: Self-pay

## 2018-06-12 NOTE — Telephone Encounter (Signed)
Pt is allergic to Penicillins and will stay on Cipro per Dr. Joseph Art. Pt verbalized understanding. Encouraged to call back if she does not feel better.

## 2018-06-12 NOTE — Telephone Encounter (Signed)
Urine culture positive for Streptococcus. Per Dr. Meda Coffee patient needs to be changed to Ampicillin 500 mg BID x 7 days. Patient called and made aware.

## 2018-06-13 NOTE — Progress Notes (Signed)
Pharmacy Antibiotic Note  Danielle Browning is a 42 y.o. female seen in the ED on 06/09/2018 with hemorrhagic cystitis.  She was treated and discharged the same day and sent home on oral ciprofloxacin 500mg  BID. Her urine culture from that day grew out E coli 30k CFU/mL resistant to Cipro. Several attempts were made to call the patient and messages were left to return the call to [place]. I will continue to try and contact the patient and, if successful, I will addend the note to reflect the conversation with the patient and provider   Thank you for allowing pharmacy to be a part of this patient's care.  Dallie Piles, PharmD 06/13/2018 12:16 PM

## 2018-06-15 ENCOUNTER — Telehealth: Payer: Self-pay

## 2018-06-15 NOTE — Telephone Encounter (Addendum)
Spoke with patient she was seen in ED for Uti Still having lower abdominal pain not severe ,   no dysuria, urine still not clear, no fever  Finished antibiotic yesterday .    Appointment on 10/16 Would you be willing to see tomorrow, prefers to see you , no appointment open except acute Please advise

## 2018-06-15 NOTE — Telephone Encounter (Signed)
Copied from [place] [id number]. Topic: Appointment Scheduling - Scheduling Inquiry for Clinic >> Jun 15, 2018 10:54 AM Reyne Dumas L wrote: Reason for CRM:   Pt has ED follow up scheduled for 10/16.  She is feeling better, but not great and would like to know if she can be worked in earlier.  I am not seeing anything from my end, but pt would like to request that the physician work her in. Pt can be reached at [phone]

## 2018-06-15 NOTE — Telephone Encounter (Signed)
That is fine 

## 2018-06-16 ENCOUNTER — Encounter: Payer: Self-pay | Admitting: Family

## 2018-06-16 ENCOUNTER — Ambulatory Visit: Payer: Managed Care, Other (non HMO) | Admitting: Family

## 2018-06-16 VITALS — BP 122/84 | HR 72 | Temp 98.3°F | Resp 18 | Wt 295.2 lb

## 2018-06-16 DIAGNOSIS — R300 Dysuria: Secondary | ICD-10-CM | POA: Diagnosis not present

## 2018-06-16 DIAGNOSIS — Z3169 Encounter for other general counseling and advice on procreation: Secondary | ICD-10-CM

## 2018-06-16 DIAGNOSIS — Z87898 Personal history of other specified conditions: Secondary | ICD-10-CM | POA: Diagnosis not present

## 2018-06-16 DIAGNOSIS — E559 Vitamin D deficiency, unspecified: Secondary | ICD-10-CM | POA: Diagnosis not present

## 2018-06-16 LAB — POCT GLYCOSYLATED HEMOGLOBIN (HGB A1C): HEMOGLOBIN A1C: 5.2 % (ref 4.0–5.6)

## 2018-06-16 MED ORDER — NITROFURANTOIN MONOHYD MACRO 100 MG PO CAPS: 100.0000 mg | ORAL_CAPSULE | Freq: Two times a day (BID) | ORAL | 0 refills | Status: DC

## 2018-06-16 NOTE — Telephone Encounter (Signed)
Appointment scheduled.

## 2018-06-16 NOTE — Patient Instructions (Addendum)
I recommend cholecalciferol 800 units daily.  Also, please ensure you are following a diet high in calcium -- research shows better outcomes with dietary sources including kale, yogurt, broccolii, cheese, okra, almonds- to name a few.    Start macrobid.   Ensure to take probiotics while on antibiotics and also for 2 weeks after completion. It is important to re-colonize the gut with good bacteria and also to prevent any diarrheal infections associated with antibiotic use.    Please go back to GYN - referral has been placed. Please let us know if you do not hear from [place].   Please start a prenatal vitamin.   Let us know if your urinary complaints do not improve.
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
## 2018-06-17 DIAGNOSIS — Z3169 Encounter for other general counseling and advice on procreation: Secondary | ICD-10-CM | POA: Insufficient documentation

## 2018-06-17 DIAGNOSIS — R300 Dysuria: Secondary | ICD-10-CM | POA: Insufficient documentation

## 2018-06-17 NOTE — Assessment & Plan Note (Signed)
Pending a1c. 

## 2018-06-17 NOTE — Assessment & Plan Note (Signed)
Has completed prescription of vitamin D ; pleased that fatigue has improved. Advised OTC vitamin D.  Note:  I thought I had ordered vitamin D blood work; we will do this at follow up to ensure stores adequate

## 2018-06-17 NOTE — Assessment & Plan Note (Signed)
Discussed weight gain with patient.  In the setting of consideration of getting pregnant, I am reluctant to start outpatient medication such as  Wellbutrin; however based on A1c, would consider medication such as metformin.  Also advised her to speak with her OB GYN, referral placed, for further preconception counseling . Advised PNV.

## 2018-06-17 NOTE — Assessment & Plan Note (Signed)
Some improvement.  Patient is well-appearing, nontoxic in appearance.  Patient treated with Cipro however urine culture was resistant to this which likely explains why patient continues to have symptoms.  We will go ahead and start patient on Macrobid and repeat urine culture today.  Will follow

## 2018-06-21 ENCOUNTER — Ambulatory Visit: Payer: Managed Care, Other (non HMO) | Admitting: Family

## 2018-07-05 ENCOUNTER — Ambulatory Visit (INDEPENDENT_AMBULATORY_CARE_PROVIDER_SITE_OTHER): Payer: Managed Care, Other (non HMO) | Admitting: Certified Nurse Midwife

## 2018-07-05 ENCOUNTER — Encounter: Payer: Self-pay | Admitting: Certified Nurse Midwife

## 2018-07-05 VITALS — BP 111/62 | HR 67 | Ht 61.0 in | Wt 292.2 lb

## 2018-07-05 DIAGNOSIS — Z30432 Encounter for removal of intrauterine contraceptive device: Secondary | ICD-10-CM | POA: Diagnosis not present

## 2018-07-05 NOTE — Patient Instructions (Signed)

## 2018-07-05 NOTE — Progress Notes (Signed)
   GYNECOLOGY OFFICE PROCEDURE NOTE  Danielle Browning is a 42 y.o. G3P0 here for mirena IUD removal. No GYN concerns.  Last pap smear was on 01/2017 and was normal, HPV negative.  IUD Removal  Patient identified, informed consent performed, consent signed.  Patient was in the dorsal lithotomy position, normal external genitalia was noted.  A speculum was placed in the patient's vagina, normal discharge was noted, no lesions. The cervix was visualized, no lesions, no abnormal discharge.  The strings of the IUD were grasped and pulled using ring forceps. The IUD was removed in its entirety. Patient tolerated the procedure well.    She plans for pregnancy soon and she was told to avoid teratogens, take PNV and folic acid. Reviewed risks with pregnancy at advance maternal age. Discussed decrease in fertility with advanced age and increased BMI, pt verbalizes and agrees to plan.  Routine preventative health maintenance measures emphasized.   Philip Aspen, CNM

## 2018-09-18 ENCOUNTER — Ambulatory Visit: Payer: Managed Care, Other (non HMO) | Admitting: Family

## 2018-09-25 ENCOUNTER — Encounter: Payer: Self-pay | Admitting: Family

## 2018-09-25 ENCOUNTER — Ambulatory Visit: Payer: Managed Care, Other (non HMO) | Admitting: Family

## 2018-09-25 VITALS — BP 104/72 | HR 75 | Temp 98.7°F | Wt 301.8 lb

## 2018-09-25 DIAGNOSIS — N912 Amenorrhea, unspecified: Secondary | ICD-10-CM

## 2018-09-25 NOTE — Patient Instructions (Signed)
Labs at work when you can  We will be in touch!
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
## 2018-09-26 LAB — HCG, SERUM, QUALITATIVE: PREG SERUM: NEGATIVE

## 2018-09-27 DIAGNOSIS — N912 Amenorrhea, unspecified: Secondary | ICD-10-CM | POA: Insufficient documentation

## 2018-09-27 NOTE — Assessment & Plan Note (Signed)
Suspect delay to menstruation due to IUD.  Advised patient to perhaps give this a couple more months.  In the interim pending lab evaluation as patient on a young man to be perimenopausal.  However since patient is considering conception prior to the age of 62 in the next 2 or 3 months, if lab evaluation is normal, will advise her to follow-up with OB/GYN as she may need progesterone treatment to start menses again.

## 2019-01-23 ENCOUNTER — Ambulatory Visit
Admission: RE | Admit: 2019-01-23 | Discharge: 2019-01-23 | Disposition: A | Discharge status: Home / Self Care | Payer: Managed Care, Other (non HMO) | Source: Ambulatory Visit | Attending: Adult Health | Admitting: Adult Health

## 2019-01-23 ENCOUNTER — Ambulatory Visit: Payer: Managed Care, Other (non HMO) | Admitting: Adult Health

## 2019-01-23 ENCOUNTER — Other Ambulatory Visit: Payer: Self-pay

## 2019-01-23 ENCOUNTER — Encounter: Payer: Self-pay | Admitting: Adult Health

## 2019-01-23 VITALS — BP 130/82 | HR 75 | Temp 97.6°F | Resp 16

## 2019-01-23 DIAGNOSIS — M549 Dorsalgia, unspecified: Secondary | ICD-10-CM

## 2019-01-23 DIAGNOSIS — E559 Vitamin D deficiency, unspecified: Secondary | ICD-10-CM | POA: Diagnosis not present

## 2019-01-23 DIAGNOSIS — M542 Cervicalgia: Secondary | ICD-10-CM

## 2019-01-23 DIAGNOSIS — M25561 Pain in right knee: Secondary | ICD-10-CM

## 2019-01-23 MED ORDER — PREDNISONE 10 MG (21) PO TBPK: ORAL_TABLET | ORAL | 0 refills | Status: DC

## 2019-01-23 MED ORDER — CYCLOBENZAPRINE HCL 10 MG PO TABS: 10.0000 mg | ORAL_TABLET | Freq: Three times a day (TID) | ORAL | 0 refills | Status: DC | PRN

## 2019-01-23 NOTE — Patient Instructions (Addendum)
Recommend labs as soon as possible given history of vitamin D deficiency - she has orders in place from her Burnard Hawthorne, FNP. Follow up with your primary care for your physical and chronic medical issues.  Vitamin D deficiency history- remember to take your vitamin d as directed by your primary care provider.  Imaging x rays ordered today- patient can go over and walk in now. She will return to clinic for follow up in two weeks. She will call the office sooner if needed and seek care at [facility] if any symptoms worsen.  Advised patient call the office or your primary care doctor for an appointment if no improvement within 72 hours or if any symptoms change or worsen at any time  Advised ER or urgent Care if after hours or on weekend. Call 911 for emergency symptoms at any time.Patinet verbalized understanding of all instructions given/reviewed and treatment plan and has no further questions or concerns at this time.    Cervical Sprain  A cervical sprain is a stretch or tear in the tissues that connect bones (ligaments) in the neck. Most neck (cervical) sprains get better in 4-6 weeks. Follow these instructions at home: If you have a neck collar:  Wear it as told by your doctor. Do not take off (do not remove) the collar unless your doctor says that this is safe.  Ask your doctor before adjusting your collar.  If you have long hair, keep it outside of the collar.  Ask your doctor if you may take off the collar for cleaning and bathing. If you may take off the collar: ? Follow instructions from your doctor about how to take off the collar safely. ? Clean the collar by wiping it with mild soap and water. Let it air-dry all the way. ? If your collar has removable pads:  Take the pads out every 1-2 days.  Hand wash the pads with soap and water.  Let the pads air-dry all the way before you put them back in the collar. Do not dry them in a clothes dryer. Do not  dry them with a hair dryer. ? Check your skin under the collar for irritation or sores. If you see any, tell your doctor. Managing pain, stiffness, and swelling   Use a cervical traction device, if told by your doctor.  If told, put heat on the affected area. Do this before exercises (physical therapy) or as often as told by your doctor. Use the heat source that your doctor recommends, such as a moist heat pack or a heating pad. ? Place a towel between your skin and the heat source. ? Leave the heat on for 20-30 minutes. ? Take the heat off (remove the heat) if your skin turns bright red. This is very important if you cannot feel pain, heat, or cold. You may have a greater risk of getting burned.  Put ice on the affected area. ? Put ice in a plastic bag. ? Place a towel between your skin and the bag. ? Leave the ice on for 20 minutes, 2-3 times a day. Activity  Do not drive while wearing a neck collar. If you do not have a neck collar, ask your doctor if it is safe to drive.  Do not drive or use heavy machinery while taking prescription pain medicine or muscle relaxants, unless your doctor approves.  Do not lift anything that is heavier than 10 lb (4.5 kg) until your doctor tells  you that it is safe.  Rest as told by your doctor.  Avoid activities that make you feel worse. Ask your doctor what activities are safe for you.  Do exercises as told by your doctor or physical therapist. Preventing neck sprain  Practice good posture. Adjust your workstation to help with this, if needed.  Exercise regularly as told by your doctor or physical therapist.  Avoid activities that are risky or may cause a neck sprain (cervical sprain). General instructions  Take over-the-counter and prescription medicines only as told by your doctor.  Do not use any products that contain nicotine or tobacco. This includes cigarettes and e-cigarettes. If you need help quitting, ask your doctor.  Keep all  follow-up visits as told by your doctor. This is important. Contact a doctor if:  You have pain or other symptoms that get worse.  You have symptoms that do not get better after 2 weeks.  You have pain that does not get better with medicine.  You start to have new, unexplained symptoms.  You have sores or irritated skin from wearing your neck collar. Get help right away if:  You have very bad pain.  You have any of the following in any part of your body: ? Loss of feeling (numbness). ? Tingling. ? Weakness.  You cannot move a part of your body (you have paralysis).  Your activity level does not improve. Summary  A cervical sprain is a stretch or tear in the tissues that connect bones (ligaments) in the neck.  If you have a neck (cervical) collar, do not take off the collar unless your doctor says that this is safe.  Put ice on affected areas as told by your doctor.  Put heat on affected areas as told by your doctor.  Good posture and regular exercise can help prevent a neck sprain from happening again. This information is not intended to replace advice given to you by your health care provider. Make sure you discuss any questions you have with your health care provider. Document Released: 02/09/2008 Document Revised: 05/04/2016 Document Reviewed: 05/04/2016 Elsevier Interactive Patient Education  2019 [place]. Acute Knee Pain, Adult Many things can cause knee pain. Sometimes, knee pain is sudden (acute) and may be caused by damage, swelling, or irritation of the muscles and tissues that support your knee. The pain often goes away on its own with time and rest. If the pain does not go away, tests may be done to find out what is causing the pain. Follow these instructions at home: Pay attention to any changes in your symptoms. Take these actions to relieve your pain. If you have a knee sleeve or brace:   Wear the sleeve or brace as told by your doctor. Remove it only  as told by your doctor.  Loosen the sleeve or brace if your toes: ? Tingle. ? Become numb. ? Turn cold and blue.  Keep the sleeve or brace clean.  If the sleeve or brace is not waterproof: ? Do not let it get wet. ? Cover it with a watertight covering when you take a bath or shower. Activity  Rest your knee.  Do not do things that cause pain.  Avoid activities where both feet leave the ground at the same time (high-impact activities). Examples are running, jumping rope, and doing jumping jacks.  Work with a physical therapist to make a safe exercise program, as told by your doctor. Managing pain, stiffness, and swelling   If told,  put ice on the knee: ? Put ice in a plastic bag. ? Place a towel between your skin and the bag. ? Leave the ice on for 20 minutes, 2-3 times a day.  If told, put pressure (compression) on your injured knee to control swelling, give support, and help with discomfort. Compression may be done with an elastic bandage. General instructions  Take all medicines only as told by your doctor.  Raise (elevate) your knee while you are sitting or lying down. Make sure your knee is higher than your heart.  Sleep with a pillow under your knee.  Do not use any products that contain nicotine or tobacco. These include cigarettes, e-cigarettes, and chewing tobacco. These products may slow down healing. If you need help quitting, ask your doctor.  If you are overweight, work with your doctor and a food expert (dietitian) to set goals to lose weight. Being overweight can make your knee hurt more.  Keep all follow-up visits as told by your doctor. This is important. Contact a doctor if:  The knee pain does not stop.  The knee pain changes or gets worse.  You have a fever along with knee pain.  Your knee feels warm when you touch it.  Your knee gives out or locks up. Get help right away if:  Your knee swells, and the swelling gets worse.  You cannot move  your knee.  You have very bad knee pain. Summary  Many things can cause knee pain. The pain often goes away on its own with time and rest.  Your doctor may do tests to find out the cause of the pain.  Pay attention to any changes in your symptoms. Relieve your pain with rest, medicines, light activity, and use of ice.  Get help right away if you cannot move your knee or your knee pain is very bad. This information is not intended to replace advice given to you by your health care provider. Make sure you discuss any questions you have with your health care provider. Document Released: 11/19/2008 Document Revised: 02/02/2018 Document Reviewed: 02/02/2018 Elsevier Interactive Patient Education  2019 [employer]  Sciatica  Sciatica is pain, numbness, weakness, or tingling along your sciatic nerve. The sciatic nerve starts in the lower back and goes down the back of each leg. Sciatica happens when this nerve is pinched or has pressure put on it. Sciatica usually goes away on its own or with treatment. Sometimes, sciatica may keep coming back (recur). Follow these instructions at home: Medicines  Take over-the-counter and prescription medicines only as told by your doctor.  Do not drive or use heavy machinery while taking prescription pain medicine. Managing pain  If directed, put ice on the affected area. ? Put ice in a plastic bag. ? Place a towel between your skin and the bag. ? Leave the ice on for 20 minutes, 2-3 times a day.  After icing, apply heat to the affected area before you exercise or as often as told by your doctor. Use the heat source that your doctor tells you to use, such as a moist heat pack or a heating pad. ? Place a towel between your skin and the heat source. ? Leave the heat on for 20-30 minutes. ? Remove the heat if your skin turns bright red. This is especially important if you are unable to feel pain, heat, or cold. You may have a greater risk of getting  burned. Activity  Return to your normal activities as  told by your doctor. Ask your doctor what activities are safe for you. ? Avoid activities that make your sciatica worse.  Take short rests during the day. Rest in a lying or standing position. This is usually better than sitting to rest. ? When you rest for a long time, do some physical activity or stretching between periods of rest. ? Avoid sitting for a long time without moving. Get up and move around at least one time each hour.  Exercise and stretch regularly, as told by your doctor.  Do not lift anything that is heavier than 10 lb (4.5 kg) while you have symptoms of sciatica. ? Avoid lifting heavy things even when you do not have symptoms. ? Avoid lifting heavy things over and over.  When you lift objects, always lift in a way that is safe for your body. To do this, you should: ? Bend your knees. ? Keep the object close to your body. ? Avoid twisting. General instructions  Use good posture. ? Avoid leaning forward when you are sitting. ? Avoid hunching over when you are standing.  Stay at a healthy weight.  Wear comfortable shoes that support your feet. Avoid wearing high heels.  Avoid sleeping on a mattress that is too soft or too hard. You might have less pain if you sleep on a mattress that is firm enough to support your back.  Keep all follow-up visits as told by your doctor. This is important. Contact a doctor if:  You have pain that: ? Wakes you up when you are sleeping. ? Gets worse when you lie down. ? Is worse than the pain you have had in the past. ? Lasts longer than 4 weeks.  You lose weight for without trying. Get help right away if:  You cannot control when you pee (urinate) or poop (have a bowel movement).  You have weakness in any of these areas and it gets worse. ? Lower back. ? Lower belly (pelvis). ? Butt (buttocks). ? Legs.  You have redness or swelling of your back.  You have a  burning feeling when you pee. This information is not intended to replace advice given to you by your health care provider. Make sure you discuss any questions you have with your health care provider. Document Released: 06/01/2008 Document Revised: 01/29/2016 Document Reviewed: 05/02/2015 Elsevier Interactive Patient Education  2019 [place].    Cyclobenzaprine tablets What is this medicine? CYCLOBENZAPRINE (sye kloe BEN za preen) is a muscle relaxer. It is used to treat muscle pain, spasms, and stiffness. This medicine may be used for other purposes; ask your health care provider or pharmacist if you have questions. COMMON BRAND NAME(S): Fexmid, Flexeril What should I tell my health care provider before I take this medicine? They need to know if you have any of these conditions: -heart disease, irregular heartbeat, or previous heart attack -liver disease -thyroid problem -an unusual or allergic reaction to cyclobenzaprine, tricyclic antidepressants, lactose, other medicines, foods, dyes, or preservatives -pregnant or trying to get pregnant -breast-feeding How should I use this medicine? Take this medicine by mouth with a glass of water. Follow the directions on the prescription label. If this medicine upsets your stomach, take it with food or milk. Take your medicine at regular intervals. Do not take it more often than directed. Talk to your pediatrician regarding the use of this medicine in children. Special care may be needed. Overdosage: If you think you have taken too much of this medicine contact  a poison control center or emergency room at once. NOTE: This medicine is only for you. Do not share this medicine with others. What if I miss a dose? If you miss a dose, take it as soon as you can. If it is almost time for your next dose, take only that dose. Do not take double or extra doses. What may interact with this medicine? Do not take this medicine with any of the following  medications: -MAOIs like Carbex, Eldepryl, Marplan, Nardil, and Parnate This medicine may also interact with the following medications: -alcohol -antihistamines for allergy, cough, and cold -certain medicines for anxiety or sleep -certain medicines for depression like amitriptyline, fluoxetine, sertraline -certain medicines for seizures like phenobarbital, primidone -contrast dyes -local anesthetics like lidocaine, pramoxine, tetracaine -medicines that relax muscles for surgery -narcotic medicines for pain -phenothiazines like chlorpromazine, mesoridazine, prochlorperazine This list may not describe all possible interactions. Give your health care provider a list of all the medicines, herbs, non-prescription drugs, or dietary supplements you use. Also tell them if you smoke, drink alcohol, or use illegal drugs. Some items may interact with your medicine. What should I watch for while using this medicine? Tell your doctor or health care professional if your symptoms do not start to get better or if they get worse. You may get drowsy or dizzy. Do not drive, use machinery, or do anything that needs mental alertness until you know how this medicine affects you. Do not stand or sit up quickly, especially if you are an older patient. This reduces the risk of dizzy or fainting spells. Alcohol may interfere with the effect of this medicine. Avoid alcoholic drinks. If you are taking another medicine that also causes drowsiness, you may have more side effects. Give your health care provider a list of all medicines you use. Your doctor will tell you how much medicine to take. Do not take more medicine than directed. Call emergency for help if you have problems breathing or unusual sleepiness. Your mouth may get dry. Chewing sugarless gum or sucking hard candy, and drinking plenty of water may help. Contact your doctor if the problem does not go away or is severe. What side effects may I notice from receiving  this medicine? Side effects that you should report to your doctor or health care professional as soon as possible: -allergic reactions like skin rash, itching or hives, swelling of the face, lips, or tongue -breathing problems -chest pain -fast, irregular heartbeat -hallucinations -seizures -unusually weak or tired Side effects that usually do not require medical attention (report to your doctor or health care professional if they continue or are bothersome): -headache -nausea, vomiting This list may not describe all possible side effects. Call your doctor for medical advice about side effects. You may report side effects to FDA at 1-800-FDA-1088. Where should I keep my medicine? Keep out of the reach of children. Store at room temperature between 15 and 30 degrees C (59 and 86 degrees F). Keep container tightly closed. Throw away any unused medicine after the expiration date. NOTE: This sheet is a summary. It may not cover all possible information. If you have questions about this medicine, talk to your doctor, pharmacist, or health care provider.  2019 Elsevier/Gold Standard (2017-06-15 13:04:35)  Prednisolone tablets What is this medicine? PREDNISOLONE (pred NISS oh lone) is a corticosteroid. It is commonly used to treat inflammation of the skin, joints, lungs, and other organs. Common conditions treated include asthma, allergies, and arthritis. It is also used  for other conditions, such as blood disorders and diseases of the adrenal glands. This medicine may be used for other purposes; ask your health care provider or pharmacist if you have questions. COMMON BRAND NAME(S): Millipred, Millipred DP, Millipred DP 12-Day, Millipred DP 6 Day, Prednoral What should I tell my health care provider before I take this medicine? They need to know if you have any of these conditions: -Cushing's syndrome -diabetes -glaucoma -heart problems or disease -high blood pressure -infection such as  herpes, measles, tuberculosis, or chickenpox -kidney disease -liver disease -mental problems -myasthenia gravis -osteoporosis -seizures -stomach ulcer or intestine disease including colitis and diverticulitis -thyroid problem -an unusual or allergic reaction to lactose, prednisolone, other medicines, foods, dyes, or preservatives -pregnant or trying to get pregnant -breast-feeding How should I use this medicine? Take this medicine by mouth with a glass of water. Follow the directions on the prescription label. Take it with food or milk to avoid stomach upset. If you are taking this medicine once a day, take it in the morning. Do not take more medicine than you are told to take. Do not suddenly stop taking your medicine because you may develop a severe reaction. Your doctor will tell you how much medicine to take. If your doctor wants you to stop the medicine, the dose may be slowly lowered over time to avoid any side effects. Talk to your pediatrician regarding the use of this medicine in children. Special care may be needed. Overdosage: If you think you have taken too much of this medicine contact a poison control center or emergency room at once. NOTE: This medicine is only for you. Do not share this medicine with others. What if I miss a dose? If you miss a dose, take it as soon as you can. If it is almost time for your next dose, take only that dose. Do not take double or extra doses. What may interact with this medicine? Do not take this medicine with any of the following medications: -metyrapone -mifepristone This medicine may also interact with the following medications: -aminoglutethimide -amphotericin B -aspirin and aspirin-like medicines -barbiturates -certain medicines for diabetes, like glipizide or glyburide -cholestyramine -cholinesterase inhibitors -cyclosporine -digoxin -diuretics -ephedrine -female hormones, like estrogens and birth control pills -isoniazid  -ketoconazole -NSAIDS, medicines for pain and inflammation, like ibuprofen or naproxen -phenytoin -rifampin -toxoids -vaccines -warfarin This list may not describe all possible interactions. Give your health care provider a list of all the medicines, herbs, non-prescription drugs, or dietary supplements you use. Also tell them if you smoke, drink alcohol, or use illegal drugs. Some items may interact with your medicine. What should I watch for while using this medicine? Visit your doctor or health care professional for regular checks on your progress. If you are taking this medicine over a prolonged period, carry an identification card with your name and address, the type and dose of your medicine, and your doctor's name and address. This medicine may increase your risk of getting an infection. Tell your doctor or health care professional if you are around anyone with measles or chickenpox, or if you develop sores or blisters that do not heal properly. If you are going to have surgery, tell your doctor or health care professional that you have taken this medicine within the last twelve months. Ask your doctor or health care professional about your diet. You may need to lower the amount of salt you eat. This medicine may affect blood sugar levels. If you have diabetes, check  with your doctor or health care professional before you change your diet or the dose of your diabetic medicine. What side effects may I notice from receiving this medicine? Side effects that you should report to your doctor or health care professional as soon as possible: -allergic reactions like skin rash, itching or hives, swelling of the face, lips, or tongue -changes in emotions or moods -changes in vision -eye pain -signs and symptoms of high blood sugar such as dizziness; dry mouth; dry skin; fruity breath; nausea; stomach pain; increased hunger or thirst; increased urination -signs and symptoms of infection like fever  or chills; cough; sore throat; pain or trouble passing urine -slow growth in children (if used for longer periods of time) -swelling of ankles, feet -trouble sleeping -unusually weak or tired -weak bones (if used for longer periods of time) Side effects that usually do not require medical attention (report to your doctor or health care professional if they continue or are bothersome): -increased hunger -nausea -skin problems, acne, thin and shiny skin -upset stomach -weight gain This list may not describe all possible side effects. Call your doctor for medical advice about side effects. You may report side effects to FDA at 1-800-FDA-1088. Where should I keep my medicine? Keep out of the reach of children. Store at room temperature between 15 and 30 degrees C (59 and 86 degrees F). Keep container tightly closed. Throw away any unused medicine after the expiration date. NOTE: This sheet is a summary. It may not cover all possible information. If you have questions about this medicine, talk to your doctor, pharmacist, or health care provider.  2019 Elsevier/Gold Standard (2015-09-25 12:30:30)  Luana Shu Cyst A Baker cyst, also called a popliteal cyst, is a sac-like growth that forms at the back of the knee. The cyst forms when the fluid-filled sac (bursa) that cushions the knee joint becomes enlarged. The bursa that becomes a Baker cyst is located at the back of the knee joint. What are the causes? In most cases, a Baker cyst results from another knee problem that causes swelling inside the knee. This makes the fluid inside the knee joint (synovial fluid) flow into the bursa behind the knee, causing the bursa to enlarge. What increases the risk? You may be more likely to develop a Baker cyst if you already have a knee problem, such as:  A tear in cartilage that cushions the knee joint (meniscal tear).  A tear in the tissues that connect the bones of the knee joint (ligament tear).  Knee  swelling from osteoarthritis, rheumatoid arthritis, or gout. What are the signs or symptoms? A Baker cyst does not always cause symptoms. A lump behind the knee may be the only sign of the condition. The lump may be painful, especially when the knee is straightened. If the lump is painful, the pain may come and go. The knee may also be stiff. Symptoms may quickly get more severe if the cyst breaks open (ruptures). If your cyst ruptures, signs and symptoms may affect the knee and the back of the lower leg (calf) and may include:  Sudden or worsening pain.  Swelling.  Bruising. How is this diagnosed? This condition may be diagnosed based on your symptoms and medical history. Your health care provider will also do a physical exam. This may include:  Feeling the cyst to check whether it is tender.  Checking your knee for signs of another knee condition that causes swelling. You may have imaging tests, such as:  X-rays.  MRI.  Ultrasound. How is this treated? A Baker cyst that is not painful may go away without treatment. If the cyst gets large or painful, it will likely get better if the underlying knee problem is treated. Treatment for a Baker cyst may include:  Resting.  Keeping weight off of the knee. This means not leaning on the knee to support your body weight.  NSAIDs to reduce pain and swelling.  A procedure to drain the fluid from the cyst with a needle (aspiration). You may also get an injection of a medicine that reduces swelling (steroid).  Surgery. This may be needed if other treatments do not work. This usually involves correcting knee damage and removing the cyst. Follow these instructions at home:   Take over-the-counter and prescription medicines only as told by your health care provider.  Rest and return to your normal activities as told by your health care provider. Avoid activities that make pain or swelling worse. Ask your health care provider what activities  are safe for you.  Keep all follow-up visits as told by your health care provider. This is important. Contact a health care provider if:  You have knee pain, stiffness, or swelling that does not get better. Get help right away if:  You have sudden or worsening pain and swelling in your calf area. This information is not intended to replace advice given to you by your health care provider. Make sure you discuss any questions you have with your health care provider. Document Released: 08/23/2005 Document Revised: 05/13/2016 Document Reviewed: 05/13/2016 Elsevier Interactive Patient Education  2019 [employer].
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
## 2019-01-24 ENCOUNTER — Telehealth: Payer: Self-pay | Admitting: Adult Health

## 2019-01-24 ENCOUNTER — Encounter: Payer: Self-pay | Admitting: Adult Health

## 2019-01-24 ENCOUNTER — Telehealth: Payer: Self-pay | Admitting: Family

## 2019-01-24 DIAGNOSIS — R937 Abnormal findings on diagnostic imaging of other parts of musculoskeletal system: Secondary | ICD-10-CM

## 2019-01-24 DIAGNOSIS — M25561 Pain in right knee: Secondary | ICD-10-CM

## 2019-01-24 DIAGNOSIS — G8929 Other chronic pain: Secondary | ICD-10-CM

## 2019-01-24 DIAGNOSIS — M5136 Other intervertebral disc degeneration, lumbar region: Secondary | ICD-10-CM

## 2019-01-24 DIAGNOSIS — M25552 Pain in left hip: Secondary | ICD-10-CM

## 2019-01-24 DIAGNOSIS — M5030 Other cervical disc degeneration, unspecified cervical region: Secondary | ICD-10-CM

## 2019-01-24 DIAGNOSIS — M1711 Unilateral primary osteoarthritis, right knee: Secondary | ICD-10-CM

## 2019-01-24 NOTE — Telephone Encounter (Signed)
Call pt  Sent you another note on her  Ensure she has f/u appt with me scheduled  A lot going on and have not spoken to patient

## 2019-01-24 NOTE — Telephone Encounter (Signed)
Call pt  Advised of labs, even in x-ray today from practitioner at the [facility] clinic.  Seems like a lot going on.  I am happy to jump in and help.    I would advise a doxy beforehand so I know what is  going on and what to to order.  Please schedule.

## 2019-01-24 NOTE — Telephone Encounter (Signed)
I have made appointment for Friday 01/26/19.
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
## 2019-01-24 NOTE — Telephone Encounter (Signed)
Patient has f/u Doxy scheduled 01/26/19.
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
## 2019-01-25 ENCOUNTER — Other Ambulatory Visit: Payer: Self-pay

## 2019-01-25 ENCOUNTER — Encounter: Payer: Self-pay | Admitting: Adult Health

## 2019-01-25 ENCOUNTER — Other Ambulatory Visit: Payer: Managed Care, Other (non HMO)

## 2019-01-25 ENCOUNTER — Ambulatory Visit: Payer: Managed Care, Other (non HMO) | Admitting: Adult Health

## 2019-01-25 VITALS — BP 120/88 | HR 70 | Temp 98.2°F | Resp 14 | Ht 61.0 in | Wt 309.0 lb

## 2019-01-25 DIAGNOSIS — Z0189 Encounter for other specified special examinations: Secondary | ICD-10-CM

## 2019-01-25 DIAGNOSIS — M5030 Other cervical disc degeneration, unspecified cervical region: Secondary | ICD-10-CM | POA: Diagnosis not present

## 2019-01-25 DIAGNOSIS — S0232XA Fracture of orbital floor, left side, initial encounter for closed fracture: Secondary | ICD-10-CM | POA: Insufficient documentation

## 2019-01-25 DIAGNOSIS — M5136 Other intervertebral disc degeneration, lumbar region: Secondary | ICD-10-CM | POA: Diagnosis not present

## 2019-01-25 DIAGNOSIS — M25552 Pain in left hip: Secondary | ICD-10-CM

## 2019-01-25 DIAGNOSIS — M1711 Unilateral primary osteoarthritis, right knee: Secondary | ICD-10-CM

## 2019-01-25 DIAGNOSIS — N912 Amenorrhea, unspecified: Secondary | ICD-10-CM | POA: Diagnosis not present

## 2019-01-25 DIAGNOSIS — M25561 Pain in right knee: Secondary | ICD-10-CM

## 2019-01-25 DIAGNOSIS — G8929 Other chronic pain: Secondary | ICD-10-CM

## 2019-01-25 DIAGNOSIS — R937 Abnormal findings on diagnostic imaging of other parts of musculoskeletal system: Secondary | ICD-10-CM

## 2019-01-25 DIAGNOSIS — Z008 Encounter for other general examination: Secondary | ICD-10-CM

## 2019-01-25 NOTE — Addendum Note (Signed)
Addended by: Judie Petit on: 01/25/2019 10:08 AM   Modules accepted: Orders

## 2019-01-25 NOTE — Addendum Note (Signed)
Addended by: Judie Petit on: 01/25/2019 10:03 AM   Modules accepted: Orders

## 2019-01-25 NOTE — Progress Notes (Signed)
  [facility]  Jeannett Senior DOB: 43 y.o. MRN: [id number]  Subjective:  Here for Biometric Screen/brief exam  Objective: Blood pressure 120/88, pulse 70, temperature (!) 97.5 F (36.4 C), temperature source Oral, resp. rate 14, height 5\' 1"  (1.549 m), weight (!) 309 lb (140.2 kg), last menstrual period 01/08/2019, SpO2 99 %. recheck temperature 98.2 F   Body mass index is 58.39 kg/m.  NAD HEENT: Within normal limits Neck: Normal , supple  Heart: Regular rate and rhythm Lungs: Clear  See previous note for acute visit.  Assessment: Biometric screen   Plan: Encouraged weight loss, exercise, taking her vitamin D as directed by her primary care as well as a multivitamin. Fasting glucose and lipids. Discussed with patient that today's visit here is a limited biometric screening visit (not a comprehensive exam or management of any chronic problems) Discussed some health issues, including healthy eating habits and exercise. Encouraged to follow-up with PCP for annual comprehensive preventive and wellness care (and if applicable, any chronic issues). Questions invited and answered.  She reports that she has follow-up with her primary care doctor on 01/26/2019 regarding her labs and her recent acute visit in the office and x-ray results   Follow up with primary care as needed for chronic and maintenance health care- can be seen in this employee clinic for acute care.    I will have the office call you on your glucose and cholesterol results when they return if you have not heard within 1 week please call the office.  This biometric physical is a brief physical and the only labs done are glucose and your lipid panel(cholesterol) and is  not a substitute for seeing a primary care provider for a complete annual physical. Please see a primary care physician for routine health maintenance, labs and full physical at least yearly and follow up as  recommended by your provider. Provider also recommends if you do not have a primary care provider for patient to establish care as soon as possible .Patient may chose provider of choice. Also gave the [place] at [phone] or web site at [email or web address] to help assist with finding a primary care doctor.  Patient verbalizes understanding that his office is acute care only and not a substitute for a primary care or for the management of chronic conditions.

## 2019-01-25 NOTE — Patient Instructions (Signed)
Health Maintenance, Female Adopting a healthy lifestyle and getting preventive care can go a long way to promote health and wellness. Talk with your health care provider about what schedule of regular examinations is right for you. This is a good chance for you to check in with your provider about disease prevention and staying healthy. In between checkups, there are plenty of things you can do on your own. Experts have done a lot of research about which lifestyle changes and preventive measures are most likely to keep you healthy. Ask your health care provider for more information. Weight and diet Eat a healthy diet  Be sure to include plenty of vegetables, fruits, low-fat dairy products, and lean protein.  Do not eat a lot of foods high in solid fats, added sugars, or salt.  Get regular exercise. This is one of the most important things you can do for your health. ? Most adults should exercise for at least 150 minutes each week. The exercise should increase your heart rate and make you sweat (moderate-intensity exercise). ? Most adults should also do strengthening exercises at least twice a week. This is in addition to the moderate-intensity exercise. Maintain a healthy weight  Body mass index (BMI) is a measurement that can be used to identify possible weight problems. It estimates body fat based on height and weight. Your health care provider can help determine your BMI and help you achieve or maintain a healthy weight.  For females 20 years of age and older: ? A BMI below 18.5 is considered underweight. ? A BMI of 18.5 to 24.9 is normal. ? A BMI of 25 to 29.9 is considered overweight. ? A BMI of 30 and above is considered obese. Watch levels of cholesterol and blood lipids  You should start having your blood tested for lipids and cholesterol at 45 years of age, then have this test every 5 years.  You may need to have your cholesterol levels checked more often if: ? Your lipid or  cholesterol levels are high. ? You are older than 45 years of age. ? You are at high risk for heart disease. Cancer screening Lung Cancer  Lung cancer screening is recommended for adults 55-80 years old who are at high risk for lung cancer because of a history of smoking.  A yearly low-dose CT scan of the lungs is recommended for people who: ? Currently smoke. ? Have quit within the past 15 years. ? Have at least a 30-pack-year history of smoking. A pack year is smoking an average of one pack of cigarettes a day for 1 year.  Yearly screening should continue until it has been 15 years since you quit.  Yearly screening should stop if you develop a health problem that would prevent you from having lung cancer treatment. Breast Cancer  Practice breast self-awareness. This means understanding how your breasts normally appear and feel.  It also means doing regular breast self-exams. Let your health care provider know about any changes, no matter how small.  If you are in your 20s or 30s, you should have a clinical breast exam (CBE) by a health care provider every 1-3 years as part of a regular health exam.  If you are 40 or older, have a CBE every year. Also consider having a breast X-ray (mammogram) every year.  If you have a family history of breast cancer, talk to your health care provider about genetic screening.  If you are at high risk for breast cancer, talk   to your health care provider about having an MRI and a mammogram every year.  Breast cancer gene (BRCA) assessment is recommended for women who have family members with BRCA-related cancers. BRCA-related cancers include: ? Breast. ? Ovarian. ? Tubal. ? Peritoneal cancers.  Results of the assessment will determine the need for genetic counseling and BRCA1 and BRCA2 testing. Cervical Cancer Your health care provider may recommend that you be screened regularly for cancer of the pelvic organs (ovaries, uterus, and vagina).  This screening involves a pelvic examination, including checking for microscopic changes to the surface of your cervix (Pap test). You may be encouraged to have this screening done every 3 years, beginning at age 21.  For women ages 30-65, health care providers may recommend pelvic exams and Pap testing every 3 years, or they may recommend the Pap and pelvic exam, combined with testing for human papilloma virus (HPV), every 5 years. Some types of HPV increase your risk of cervical cancer. Testing for HPV may also be done on women of any age with unclear Pap test results.  Other health care providers may not recommend any screening for nonpregnant women who are considered low risk for pelvic cancer and who do not have symptoms. Ask your health care provider if a screening pelvic exam is right for you.  If you have had past treatment for cervical cancer or a condition that could lead to cancer, you need Pap tests and screening for cancer for at least 20 years after your treatment. If Pap tests have been discontinued, your risk factors (such as having a new sexual partner) need to be reassessed to determine if screening should resume. Some women have medical problems that increase the chance of getting cervical cancer. In these cases, your health care provider may recommend more frequent screening and Pap tests. Colorectal Cancer  This type of cancer can be detected and often prevented.  Routine colorectal cancer screening usually begins at 45 years of age and continues through 45 years of age.  Your health care provider may recommend screening at an earlier age if you have risk factors for colon cancer.  Your health care provider may also recommend using home test kits to check for hidden blood in the stool.  A small camera at the end of a tube can be used to examine your colon directly (sigmoidoscopy or colonoscopy). This is done to check for the earliest forms of colorectal cancer.  Routine  screening usually begins at age 50.  Direct examination of the colon should be repeated every 5-10 years through 45 years of age. However, you may need to be screened more often if early forms of precancerous polyps or small growths are found. Skin Cancer  Check your skin from head to toe regularly.  Tell your health care provider about any new moles or changes in moles, especially if there is a change in a mole's shape or color.  Also tell your health care provider if you have a mole that is larger than the size of a pencil eraser.  Always use sunscreen. Apply sunscreen liberally and repeatedly throughout the day.  Protect yourself by wearing long sleeves, pants, a wide-brimmed hat, and sunglasses whenever you are outside. Heart disease, diabetes, and high blood pressure  High blood pressure causes heart disease and increases the risk of stroke. High blood pressure is more likely to develop in: ? People who have blood pressure in the high end of the normal range (130-139/85-89 mm Hg). ? People   who are overweight or obese. ? People who are African American.  If you are 84-22 years of age, have your blood pressure checked every 3-5 years. If you are 67 years of age or older, have your blood pressure checked every year. You should have your blood pressure measured twice-once when you are at a hospital or clinic, and once when you are not at a hospital or clinic. Record the average of the two measurements. To check your blood pressure when you are not at a hospital or clinic, you can use: ? An automated blood pressure machine at a pharmacy. ? A home blood pressure monitor.  If you are between 52 years and 3 years old, ask your health care provider if you should take aspirin to prevent strokes.  Have regular diabetes screenings. This involves taking a blood sample to check your fasting blood sugar level. ? If you are at a normal weight and have a low risk for diabetes, have this test once  every three years after 45 years of age. ? If you are overweight and have a high risk for diabetes, consider being tested at a younger age or more often. Preventing infection Hepatitis B  If you have a higher risk for hepatitis B, you should be screened for this virus. You are considered at high risk for hepatitis B if: ? You were born in a country where hepatitis B is common. Ask your health care provider which countries are considered high risk. ? Your parents were born in a high-risk country, and you have not been immunized against hepatitis B (hepatitis B vaccine). ? You have HIV or AIDS. ? You use needles to inject street drugs. ? You live with someone who has hepatitis B. ? You have had sex with someone who has hepatitis B. ? You get hemodialysis treatment. ? You take certain medicines for conditions, including cancer, organ transplantation, and autoimmune conditions. Hepatitis C  Blood testing is recommended for: ? Everyone born from [birth year] through 1967. ? Anyone with known risk factors for hepatitis C. Sexually transmitted infections (STIs)  You should be screened for sexually transmitted infections (STIs) including gonorrhea and chlamydia if: ? You are sexually active and are younger than 45 years of age. ? You are older than 45 years of age and your health care provider tells you that you are at risk for this type of infection. ? Your sexual activity has changed since you were last screened and you are at an increased risk for chlamydia or gonorrhea. Ask your health care provider if you are at risk.  If you do not have HIV, but are at risk, it may be recommended that you take a prescription medicine daily to prevent HIV infection. This is called pre-exposure prophylaxis (PrEP). You are considered at risk if: ? You are sexually active and do not regularly use condoms or know the HIV status of your partner(s). ? You take drugs by injection. ? You are sexually active with a partner  who has HIV. Talk with your health care provider about whether you are at high risk of being infected with HIV. If you choose to begin PrEP, you should first be tested for HIV. You should then be tested every 3 months for as long as you are taking PrEP. Pregnancy  If you are premenopausal and you may become pregnant, ask your health care provider about preconception counseling.  If you may become pregnant, take 400 to 800 micrograms (mcg) of folic acid every  day.  If you want to prevent pregnancy, talk to your health care provider about birth control (contraception). Osteoporosis and menopause  Osteoporosis is a disease in which the bones lose minerals and strength with aging. This can result in serious bone fractures. Your risk for osteoporosis can be identified using a bone density scan.  If you are 8 years of age or older, or if you are at risk for osteoporosis and fractures, ask your health care provider if you should be screened.  Ask your health care provider whether you should take a calcium or vitamin D supplement to lower your risk for osteoporosis.  Menopause may have certain physical symptoms and risks.  Hormone replacement therapy may reduce some of these symptoms and risks. Talk to your health care provider about whether hormone replacement therapy is right for you. Follow these instructions at home:  Schedule regular health, dental, and eye exams.  Stay current with your immunizations.  Do not use any tobacco products including cigarettes, chewing tobacco, or electronic cigarettes.  If you are pregnant, do not drink alcohol.  If you are breastfeeding, limit how much and how often you drink alcohol.  Limit alcohol intake to no more than 1 drink per day for nonpregnant women. One drink equals 12 ounces of beer, 5 ounces of wine, or 1 ounces of hard liquor.  Do not use street drugs.  Do not share needles.  Ask your health care provider for help if you need support  or information about quitting drugs.  Tell your health care provider if you often feel depressed.  Tell your health care provider if you have ever been abused or do not feel safe at home. This information is not intended to replace advice given to you by your health care provider. Make sure you discuss any questions you have with your health care provider. Document Released: 03/08/2011 Document Revised: 01/29/2016 Document Reviewed: 05/27/2015 Elsevier Interactive Patient Education  2019 [place] refers to food and lifestyle choices that are based on the traditions of countries located on the [employer]. This way of eating has been shown to help prevent certain conditions and improve outcomes for people who have chronic diseases, like kidney disease and heart disease. What are tips for following this plan? Lifestyle  Cook and eat meals together with your family, when possible.  Drink enough fluid to keep your urine clear or pale yellow.  Be physically active every day. This includes: ? Aerobic exercise like running or swimming. ? Leisure activities like gardening, walking, or housework.  Get 7-8 hours of sleep each night.  If recommended by your health care provider, drink red wine in moderation. This means 1 glass a day for nonpregnant women and 2 glasses a day for men. A glass of wine equals 5 oz (150 mL). Reading food labels   Check the serving size of packaged foods. For foods such as rice and pasta, the serving size refers to the amount of cooked product, not dry.  Check the total fat in packaged foods. Avoid foods that have saturated fat or trans fats.  Check the ingredients list for added sugars, such as corn syrup. Shopping  At the grocery store, buy most of your food from the areas near the walls of the store. This includes: ? Fresh fruits and vegetables (produce). ? Grains, beans, nuts, and seeds. Some of these may be  available in unpackaged forms or large amounts (in bulk). ? Fresh seafood. ?  Poultry and eggs. ? Low-fat dairy products.  Buy whole ingredients instead of prepackaged foods.  Buy fresh fruits and vegetables in-season from local farmers markets.  Buy frozen fruits and vegetables in resealable bags.  If you do not have access to quality fresh seafood, buy precooked frozen shrimp or canned fish, such as tuna, salmon, or sardines.  Buy small amounts of raw or cooked vegetables, salads, or olives from the deli or salad bar at your store.  Stock your pantry so you always have certain foods on hand, such as olive oil, canned tuna, canned tomatoes, rice, pasta, and beans. Cooking  Cook foods with extra-virgin olive oil instead of using butter or other vegetable oils.  Have meat as a side dish, and have vegetables or grains as your main dish. This means having meat in small portions or adding small amounts of meat to foods like pasta or stew.  Use beans or vegetables instead of meat in common dishes like chili or lasagna.  Experiment with different cooking methods. Try roasting or broiling vegetables instead of steaming or sauteing them.  Add frozen vegetables to soups, stews, pasta, or rice.  Add nuts or seeds for added healthy fat at each meal. You can add these to yogurt, salads, or vegetable dishes.  Marinate fish or vegetables using olive oil, lemon juice, garlic, and fresh herbs. Meal planning   Plan to eat 1 vegetarian meal one day each week. Try to work up to 2 vegetarian meals, if possible.  Eat seafood 2 or more times a week.  Have healthy snacks readily available, such as: ? Vegetable sticks with hummus. ? [place] yogurt. ? Fruit and nut trail mix.  Eat balanced meals throughout the week. This includes: ? Fruit: 2-3 servings a day ? Vegetables: 4-5 servings a day ? Low-fat dairy: 2 servings a day ? Fish, poultry, or lean meat: 1 serving a day ? Beans and legumes: 2 or  more servings a week ? Nuts and seeds: 1-2 servings a day ? Whole grains: 6-8 servings a day ? Extra-virgin olive oil: 3-4 servings a day  Limit red meat and sweets to only a few servings a month What are my food choices?  Mediterranean diet ? Recommended ? Grains: Whole-grain pasta. Brown rice. Bulgar wheat. Polenta. Couscous. Whole-wheat bread. [place]. ? Vegetables: Artichokes. Beets. Broccoli. Cabbage. Carrots. Eggplant. Green beans. Chard. Kale. Spinach. Onions. Leeks. Peas. Squash. Tomatoes. Peppers. Radishes. ? Fruits: Apples. Apricots. Avocado. Berries. Bananas. Cherries. Dates. Figs. Grapes. Lemons. Melon. Oranges. Peaches. Plums. Pomegranate. ? Meats and other protein foods: Beans. Almonds. Sunflower seeds. Pine nuts. Peanuts. [place]. Salmon. Scallops. Shrimp. [place]. Tilapia. Clams. Oysters. Eggs. ? Dairy: Low-fat milk. Cheese. Greek yogurt. ? Beverages: Water. Red wine. Herbal tea. ? Fats and oils: Extra virgin olive oil. Avocado oil. Grape seed oil. ? Sweets and desserts: [place] yogurt with honey. Baked apples. Poached pears. Trail mix. ? Seasoning and other foods: Basil. Cilantro. Coriander. Cumin. Mint. Parsley. Sage. Rosemary. Tarragon. Garlic. Oregano. Thyme. Pepper. Balsalmic vinegar. Tahini. Hummus. Tomato sauce. Olives. Mushrooms. ? Limit these ? Grains: Prepackaged pasta or rice dishes. Prepackaged cereal with added sugar. ? Vegetables: Deep fried potatoes (french fries). ? Fruits: Fruit canned in syrup. ? Meats and other protein foods: Beef. Pork. Lamb. Poultry with skin. Hot dogs. [place] Salines. ? Dairy: Ice cream. Sour cream. Whole milk. ? Beverages: Juice. Sugar-sweetened soft drinks. Beer. Liquor and spirits. ? Fats and oils: Butter. Canola oil. Vegetable oil. Beef fat (tallow). Lard. ? Sweets and  desserts: Cookies. Cakes. Pies. Candy. ? Seasoning and other foods: Mayonnaise. Premade sauces and marinades. ? The items listed may not be a complete list. Talk with your  dietitian about what dietary choices are right for you. Summary  The Mediterranean diet includes both food and lifestyle choices.  Eat a variety of fresh fruits and vegetables, beans, nuts, seeds, and whole grains.  Limit the amount of red meat and sweets that you eat.  Talk with your health care provider about whether it is safe for you to drink red wine in moderation. This means 1 glass a day for nonpregnant women and 2 glasses a day for men. A glass of wine equals 5 oz (150 mL). This information is not intended to replace advice given to you by your health care provider. Make sure you discuss any questions you have with your health care provider. Document Released: 04/15/2016 Document Revised: 05/18/2016 Document Reviewed: 04/15/2016 Elsevier Interactive Patient Education  2019 [employer].

## 2019-01-26 ENCOUNTER — Encounter: Payer: Self-pay | Admitting: Family

## 2019-01-26 ENCOUNTER — Ambulatory Visit (INDEPENDENT_AMBULATORY_CARE_PROVIDER_SITE_OTHER): Payer: Managed Care, Other (non HMO) | Admitting: Family

## 2019-01-26 DIAGNOSIS — R600 Localized edema: Secondary | ICD-10-CM | POA: Diagnosis not present

## 2019-01-26 DIAGNOSIS — M2550 Pain in unspecified joint: Secondary | ICD-10-CM | POA: Diagnosis not present

## 2019-01-26 LAB — ANA: Anti Nuclear Antibody (ANA): NEGATIVE

## 2019-01-26 LAB — PROLACTIN: Prolactin: 15.4 ng/mL (ref 4.8–23.3)

## 2019-01-26 LAB — RHEUMATOID FACTOR: Rheumatoid fact SerPl-aCnc: <10 IU/mL (ref 0.0–13.9)

## 2019-01-26 LAB — C-REACTIVE PROTEIN: CRP: 12 mg/L — ABNORMAL HIGH (ref 0–10)

## 2019-01-26 MED ORDER — BUPROPION HCL ER (XL) 150 MG PO TB24: ORAL_TABLET | ORAL | 3 refills | Status: DC

## 2019-01-26 NOTE — Assessment & Plan Note (Addendum)
Chronic. Suspect venous insufficiency. In setting of prior DVT, advised [place] BL, she Declines [place] of legs since unchanged from prior. Advised lifestyle therapy including compression stockings.

## 2019-01-26 NOTE — Patient Instructions (Addendum)
Advise back up contraception if dont want pregancy as wellbutrin IS NOT SAFE in pregnancy. Data is inconclusive in regard to congential heart defects.   Please see GYN for other options in birth control.   For back pain, may use OTC:   BioFreeze [place]  Today we discussed referrals, orders. Orthopedics.    I have placed these orders in the system for you.  Please be sure to give [place] a call if you have not heard from our office regarding this. We should hear from [place] within ONE week with information regarding your appointment. If not, please let me know immediately.   Trial of wellbutrin.   Call and schedule follow up in 2 weeks.

## 2019-01-28 LAB — TSH: TSH: 1.34 u[IU]/mL (ref 0.450–4.500)

## 2019-01-28 LAB — COMPREHENSIVE METABOLIC PANEL
ALT: 17 IU/L (ref 0–32)
AST: 16 IU/L (ref 0–40)
Albumin/Globulin Ratio: 1.4 (ref 1.2–2.2)
Albumin: 4.4 g/dL (ref 3.8–4.8)
Alkaline Phosphatase: 96 IU/L (ref 39–117)
BUN/Creatinine Ratio: 25 — ABNORMAL HIGH (ref 9–23)
BUN: 15 mg/dL (ref 6–24)
Bilirubin Total: <0.2 mg/dL (ref 0.0–1.2)
CO2: 19 mmol/L — ABNORMAL LOW (ref 20–29)
Calcium: 9.5 mg/dL (ref 8.7–10.2)
Chloride: 100 mmol/L (ref 96–106)
Creatinine, Ser: 0.59 mg/dL (ref 0.57–1.00)
GFR calc Af Amer: 128 mL/min/{1.73_m2} (ref >59)
GFR calc non Af Amer: 111 mL/min/{1.73_m2} (ref >59)
Globulin, Total: 3.2 g/dL (ref 1.5–4.5)
Glucose: 94 mg/dL (ref 65–99)
Potassium: 4.7 mmol/L (ref 3.5–5.2)
Sodium: 139 mmol/L (ref 134–144)
Total Protein: 7.6 g/dL (ref 6.0–8.5)

## 2019-01-28 LAB — LIPID PANEL WITH LDL/HDL RATIO
Cholesterol, Total: 220 mg/dL — ABNORMAL HIGH (ref 100–199)
HDL: 61 mg/dL (ref >39)
LDL Calculated: 142 mg/dL — ABNORMAL HIGH (ref 0–99)
LDl/HDL Ratio: 2.3 ratio (ref 0.0–3.2)
Triglycerides: 83 mg/dL (ref 0–149)
VLDL Cholesterol Cal: 17 mg/dL (ref 5–40)

## 2019-01-28 LAB — CBC WITH DIFFERENTIAL/PLATELET
Basophils Absolute: 0 10*3/uL (ref 0.0–0.2)
Basos: 0 %
EOS (ABSOLUTE): 0 10*3/uL (ref 0.0–0.4)
Eos: 0 %
Hematocrit: 44.7 % (ref 34.0–46.6)
Hemoglobin: 14.3 g/dL (ref 11.1–15.9)
Immature Grans (Abs): 0 10*3/uL (ref 0.0–0.1)
Immature Granulocytes: 0 %
Lymphocytes Absolute: 1.9 10*3/uL (ref 0.7–3.1)
Lymphs: 14 %
MCH: 28.3 pg (ref 26.6–33.0)
MCHC: 32 g/dL (ref 31.5–35.7)
MCV: 89 fL (ref 79–97)
Monocytes Absolute: 0.4 10*3/uL (ref 0.1–0.9)
Monocytes: 3 %
Neutrophils Absolute: 11.5 10*3/uL — ABNORMAL HIGH (ref 1.4–7.0)
Neutrophils: 83 %
Platelets: 400 10*3/uL (ref 150–450)
RBC: 5.05 x10E6/uL (ref 3.77–5.28)
RDW: 12.8 % (ref 11.7–15.4)
WBC: 13.9 10*3/uL — ABNORMAL HIGH (ref 3.4–10.8)

## 2019-01-28 LAB — VITAMIN D 25 HYDROXY (VIT D DEFICIENCY, FRACTURES): Vit D, 25-Hydroxy: 22.5 ng/mL — ABNORMAL LOW (ref 30.0–100.0)

## 2019-01-28 LAB — FOLLICLE STIMULATING HORMONE: FSH: 14.3 m[IU]/mL

## 2019-01-28 LAB — HGB A1C W/O EAG: Hgb A1c MFr Bld: 5.6 % (ref 4.8–5.6)

## 2019-01-30 NOTE — Progress Notes (Signed)
Perfect thank you!

## 2019-01-31 ENCOUNTER — Encounter: Payer: Self-pay | Admitting: Family

## 2019-01-31 DIAGNOSIS — M2550 Pain in unspecified joint: Secondary | ICD-10-CM | POA: Insufficient documentation

## 2019-01-31 NOTE — Assessment & Plan Note (Addendum)
Trial of Wellbutrin.  She is aware that Wellbutrin is not a safe medication for childbearing.  She is aware of teratogenicity effects. she politely declines referral to [facility] at this time.  Close follow up.

## 2019-01-31 NOTE — Assessment & Plan Note (Addendum)
Suspect obesity, lack of exercise contributory. Labs not overly revealing for autoimmune etiology particularly as weight bearing joints appear more bothersome, particularly abnormalities seen on XR of cervical and lumbar spine. Will start with orthopedic consult and discuss rheumatology at follow up.  Will follow.

## 2019-02-05 ENCOUNTER — Other Ambulatory Visit: Payer: Self-pay | Admitting: Family

## 2019-02-05 DIAGNOSIS — R899 Unspecified abnormal finding in specimens from other organs, systems and tissues: Secondary | ICD-10-CM

## 2019-02-09 ENCOUNTER — Ambulatory Visit: Payer: Managed Care, Other (non HMO) | Admitting: Family

## 2019-02-09 ENCOUNTER — Ambulatory Visit: Payer: Managed Care, Other (non HMO) | Admitting: Adult Health

## 2019-02-15 DIAGNOSIS — K219 Gastro-esophageal reflux disease without esophagitis: Secondary | ICD-10-CM | POA: Insufficient documentation

## 2019-02-15 DIAGNOSIS — R010 Benign and innocent cardiac murmurs: Secondary | ICD-10-CM | POA: Insufficient documentation

## 2019-02-19 ENCOUNTER — Other Ambulatory Visit: Payer: Managed Care, Other (non HMO)

## 2019-02-19 ENCOUNTER — Telehealth: Payer: Self-pay | Admitting: *Deleted

## 2019-02-19 DIAGNOSIS — Z20822 Contact with and (suspected) exposure to covid-19: Secondary | ICD-10-CM

## 2019-02-19 NOTE — Telephone Encounter (Signed)
Call placed to patient.  Scheduled for COVID 19 testing at 3:45 PM at the [facility].  Advised to wear a mask, drive up to site, and remain in car for the test.  Verb. Understanding.

## 2019-02-19 NOTE — Telephone Encounter (Signed)
[place] [facility]- request COVID testing  Exposure to + COVID

## 2019-02-20 LAB — NOVEL CORONAVIRUS, NAA: SARS-CoV-2, NAA: NOT DETECTED

## 2019-04-04 ENCOUNTER — Other Ambulatory Visit: Payer: Self-pay

## 2019-04-04 ENCOUNTER — Encounter: Payer: Self-pay | Admitting: Emergency Medicine

## 2019-04-04 ENCOUNTER — Emergency Department
Admission: EM | Admit: 2019-04-04 | Discharge: 2019-04-04 | Disposition: A | Discharge status: Home / Self Care | Payer: Managed Care, Other (non HMO) | Attending: Emergency Medicine | Admitting: Emergency Medicine

## 2019-04-04 DIAGNOSIS — L509 Urticaria, unspecified: Secondary | ICD-10-CM | POA: Diagnosis not present

## 2019-04-04 DIAGNOSIS — T7840XA Allergy, unspecified, initial encounter: Secondary | ICD-10-CM

## 2019-04-04 DIAGNOSIS — Z79899 Other long term (current) drug therapy: Secondary | ICD-10-CM | POA: Insufficient documentation

## 2019-04-04 MED ORDER — SODIUM CHLORIDE 0.9 % IV BOLUS
1000.0000 mL | Freq: Once | INTRAVENOUS | Status: AC
  Administered 2019-04-04: 1000 mL via INTRAVENOUS

## 2019-04-04 MED ORDER — METHYLPREDNISOLONE SODIUM SUCC 125 MG IJ SOLR
125.0000 mg | Freq: Once | INTRAMUSCULAR | Status: AC
Start: 2019-04-04 — End: 2019-04-04
  Administered 2019-04-04: 125 mg via INTRAVENOUS
  Filled 2019-04-04: qty 2

## 2019-04-04 MED ORDER — FAMOTIDINE IN NACL 20-0.9 MG/50ML-% IV SOLN
20.0000 mg | Freq: Once | INTRAVENOUS | Status: AC
  Administered 2019-04-04: 10:00:00 20 mg via INTRAVENOUS
  Filled 2019-04-04: qty 50

## 2019-04-04 MED ORDER — DIPHENHYDRAMINE HCL 50 MG/ML IJ SOLN
50.0000 mg | Freq: Once | INTRAMUSCULAR | Status: AC
  Administered 2019-04-04: 10:00:00 50 mg via INTRAVENOUS
  Filled 2019-04-04: qty 1
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
## 2019-04-04 NOTE — ED Notes (Signed)
Pt states bit by ants yesterday, c/o pain to neck and great toe from ant bites with swelling. Pt able to speak in complete sentences at this time. NAD noted. Pt however c/o feeling throat tightness at this time. Pt able to maintain secretions without difficulty, speak in complete sentences without difficulty, respirations even and unlabored at this time.

## 2019-04-04 NOTE — ED Triage Notes (Signed)
Pt states ant bite to second digit on left foot and right neck that are painful, pt states neck feels swollen, able to talk in complete sentences, no known allergy to ant bites, took one benadryl last night and one this am with no relief. NAD.

## 2019-05-01 ENCOUNTER — Other Ambulatory Visit: Payer: Self-pay

## 2019-05-01 DIAGNOSIS — Z20822 Contact with and (suspected) exposure to covid-19: Secondary | ICD-10-CM

## 2019-05-01 NOTE — Progress Notes (Unsigned)
ab 

## 2019-05-02 LAB — NOVEL CORONAVIRUS, NAA: SARS-CoV-2, NAA: NOT DETECTED

## 2019-05-03 ENCOUNTER — Telehealth: Payer: Self-pay | Admitting: *Deleted

## 2019-05-03 NOTE — Telephone Encounter (Signed)
Scheduled appointment for 11 :30 tomorrow. FYI virtual.

## 2019-05-03 NOTE — Telephone Encounter (Signed)
Copied from [place] [id number]. Topic: General - Other >> May 03, 2019  9:56 AM Antonieta Iba C wrote: Reason for CRM: pt called in to be advised. Pt says that she had covid testing. She tested negative. Pt says that her employer is requiring that she speak with provider to be released back to work   Please assist   CB:[phone]

## 2019-05-04 ENCOUNTER — Encounter: Payer: Self-pay | Admitting: Family

## 2019-05-04 ENCOUNTER — Ambulatory Visit (INDEPENDENT_AMBULATORY_CARE_PROVIDER_SITE_OTHER): Payer: Managed Care, Other (non HMO) | Admitting: Family

## 2019-05-04 ENCOUNTER — Other Ambulatory Visit: Payer: Self-pay

## 2019-05-04 VITALS — Ht 61.0 in | Wt 297.0 lb

## 2019-05-04 DIAGNOSIS — J329 Chronic sinusitis, unspecified: Secondary | ICD-10-CM | POA: Diagnosis not present

## 2019-05-04 DIAGNOSIS — J0100 Acute maxillary sinusitis, unspecified: Secondary | ICD-10-CM | POA: Diagnosis not present

## 2019-05-04 MED ORDER — DOXYCYCLINE HYCLATE 100 MG PO TABS: 100.0000 mg | ORAL_TABLET | Freq: Two times a day (BID) | ORAL | 0 refills | Status: DC

## 2019-05-04 NOTE — Assessment & Plan Note (Signed)
No improvement on Wellbutrin however advised her she may want to try this medication for longer which she agreed.  We discussed Saxenda as a potential option if Wellbutrin does not work

## 2019-05-04 NOTE — Assessment & Plan Note (Signed)
Some improvement.  Patient is febrile.  She has a complex history of an orbital wall fracture and chronic sinusitis.  We will go ahead and start doxycycline for suspected bacterial sinusitis.  She was COVID negative however I advised her this test can be false negative.  Advised her to must stay home for a full 7 days since symptom onset and not return to work until 3 days of complete symptom resolution.  Work note provided.  She agrees and will adhere to plan and NOT return to work until complete symptom resolution.  She will also let me know how she is doing
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
## 2019-05-04 NOTE — Patient Instructions (Addendum)
May consider Saxenda.   Suspect bacterial sinus infection; start doxycycline. Avoid the sun on this medication as it can make it easier to sunburn.   Ensure to take probiotics while on antibiotics and also for 2 weeks after completion. It is important to re-colonize the gut with good bacteria and also to prevent any diarrheal infections associated with antibiotic use.    Today we discussed referrals, orders. ENT    I have placed these orders in the system for you.  Please be sure to give [place] a call if you have not heard from our office regarding this. We should hear from [place] within ONE week with information regarding your appointment. If not, please let me know immediately.     You may not return to work until at least 7 days since symptom onset and AT LEAST 3 days of NO symptoms/complete resolution prior to returning to work.  See work note on Doctor, hospital, under communications.   Stay safe.     Person Under Monitoring Name: Danielle Browning  Location:  [place] [address]   Infection Prevention Recommendations for Individuals Confirmed to have, or Being Evaluated for, 2019 Novel Coronavirus (COVID-19) Infection Who Receive Care at Home  Individuals who are confirmed to have, or are being evaluated for, COVID-19 should follow the prevention steps below until a healthcare provider or local or state health department says they can return to normal activities.  Stay home except to get medical care You should restrict activities outside your home, except for getting medical care. Do not go to work, school, or public areas, and do not use public transportation or taxis.  Call ahead before visiting your doctor Before your medical appointment, call the healthcare provider and tell them that you have, or are being evaluated for, COVID-19 infection. This will help the healthcare provider's office take steps to keep other people from getting infected. Ask your  healthcare provider to call the local or state health department.  Monitor your symptoms Seek prompt medical attention if your illness is worsening (e.g., difficulty breathing). Before going to your medical appointment, call the healthcare provider and tell them that you have, or are being evaluated for, COVID-19 infection. Ask your healthcare provider to call the local or state health department.  Wear a facemask You should wear a facemask that covers your nose and mouth when you are in the same room with other people and when you visit a healthcare provider. People who live with or visit you should also wear a facemask while they are in the same room with you.  Separate yourself from other people in your home As much as possible, you should stay in a different room from other people in your home. Also, you should use a separate bathroom, if available.  Avoid sharing household items You should not share dishes, drinking glasses, cups, eating utensils, towels, bedding, or other items with other people in your home. After using these items, you should wash them thoroughly with soap and water.  Cover your coughs and sneezes Cover your mouth and nose with a tissue when you cough or sneeze, or you can cough or sneeze into your sleeve. Throw used tissues in a lined trash can, and immediately wash your hands with soap and water for at least 20 seconds or use an alcohol-based hand rub.  Wash your [facility] your hands often and thoroughly with soap and water for at least 20 seconds. You can use an  alcohol-based hand sanitizer if soap and water are not available and if your hands are not visibly dirty. Avoid touching your eyes, nose, and mouth with unwashed hands.   Prevention Steps for Caregivers and Household Members of Individuals Confirmed to have, or Being Evaluated for, COVID-19 Infection Being Cared for in the Home  If you live with, or provide care at home for, a person confirmed to  have, or being evaluated for, COVID-19 infection please follow these guidelines to prevent infection:  Follow healthcare provider's instructions Make sure that you understand and can help the patient follow any healthcare provider instructions for all care.  Provide for the patient's basic needs You should help the patient with basic needs in the home and provide support for getting groceries, prescriptions, and other personal needs.  Monitor the patient's symptoms If they are getting sicker, call his or her medical provider and tell them that the patient has, or is being evaluated for, COVID-19 infection. This will help the healthcare provider's office take steps to keep other people from getting infected. Ask the healthcare provider to call the local or state health department.  Limit the number of people who have contact with the patient  If possible, have only one caregiver for the patient.  Other household members should stay in another home or place of residence. If this is not possible, they should stay  in another room, or be separated from the patient as much as possible. Use a separate bathroom, if available.  Restrict visitors who do not have an essential need to be in the home.  Keep older adults, very young children, and other sick people away from the patient Keep older adults, very young children, and those who have compromised immune systems or chronic health conditions away from the patient. This includes people with chronic heart, lung, or kidney conditions, diabetes, and cancer.  Ensure good ventilation Make sure that shared spaces in the home have good air flow, such as from an air conditioner or an opened window, weather permitting.  Wash your hands often  Wash your hands often and thoroughly with soap and water for at least 20 seconds. You can use an alcohol based hand sanitizer if soap and water are not available and if your hands are not visibly dirty.   Avoid touching your eyes, nose, and mouth with unwashed hands.  Use disposable paper towels to dry your hands. If not available, use dedicated cloth towels and replace them when they become wet.  Wear a facemask and gloves  Wear a disposable facemask at all times in the room and gloves when you touch or have contact with the patient's blood, body fluids, and/or secretions or excretions, such as sweat, saliva, sputum, nasal mucus, vomit, urine, or feces.  Ensure the mask fits over your nose and mouth tightly, and do not touch it during use.  Throw out disposable facemasks and gloves after using them. Do not reuse.  Wash your hands immediately after removing your facemask and gloves.  If your personal clothing becomes contaminated, carefully remove clothing and launder. Wash your hands after handling contaminated clothing.  Place all used disposable facemasks, gloves, and other waste in a lined container before disposing them with other household waste.  Remove gloves and wash your hands immediately after handling these items.  Do not share dishes, glasses, or other household items with the patient  Avoid sharing household items. You should not share dishes, drinking glasses, cups, eating utensils, towels, bedding, or other  items with a patient who is confirmed to have, or being evaluated for, COVID-19 infection.  After the person uses these items, you should wash them thoroughly with soap and water.  Wash laundry thoroughly  Immediately remove and wash clothes or bedding that have blood, body fluids, and/or secretions or excretions, such as sweat, saliva, sputum, nasal mucus, vomit, urine, or feces, on them.  Wear gloves when handling laundry from the patient.  Read and follow directions on labels of laundry or clothing items and detergent. In general, wash and dry with the warmest temperatures recommended on the label.  Clean all areas the individual has used often  Clean all  touchable surfaces, such as counters, tabletops, doorknobs, bathroom fixtures, toilets, phones, keyboards, tablets, and bedside tables, every day. Also, clean any surfaces that may have blood, body fluids, and/or secretions or excretions on them.  Wear gloves when cleaning surfaces the patient has come in contact with.  Use a diluted bleach solution (e.g., dilute bleach with 1 part bleach and 10 parts water) or a household disinfectant with a label that says EPA-registered for coronaviruses. To make a bleach solution at home, add 1 tablespoon of bleach to 1 quart (4 cups) of water. For a larger supply, add  cup of bleach to 1 gallon (16 cups) of water.  Read labels of cleaning products and follow recommendations provided on product labels. Labels contain instructions for safe and effective use of the cleaning product including precautions you should take when applying the product, such as wearing gloves or eye protection and making sure you have good ventilation during use of the product.  Remove gloves and wash hands immediately after cleaning.  Monitor yourself for signs and symptoms of illness Caregivers and household members are considered close contacts, should monitor their health, and will be asked to limit movement outside of the home to the extent possible. Follow the monitoring steps for close contacts listed on the symptom monitoring form.   ? If you have additional questions, contact your local health department or call the epidemiologist on call at [phone] (available 24/7). ? This guidance is subject to change. For the most up-to-date guidance from [facility], please refer to their website: [employer]

## 2019-05-07 ENCOUNTER — Ambulatory Visit: Payer: Managed Care, Other (non HMO) | Admitting: Family

## 2019-07-04 DIAGNOSIS — Z6841 Body Mass Index (BMI) 40.0 and over, adult: Secondary | ICD-10-CM | POA: Insufficient documentation

## 2019-08-29 ENCOUNTER — Encounter: Payer: Self-pay | Admitting: Emergency Medicine

## 2019-08-29 ENCOUNTER — Ambulatory Visit
Admission: EM | Admit: 2019-08-29 | Discharge: 2019-08-29 | Disposition: A | Discharge status: Home / Self Care | Payer: Managed Care, Other (non HMO) | Attending: Family Medicine | Admitting: Family Medicine

## 2019-08-29 ENCOUNTER — Other Ambulatory Visit: Payer: Self-pay

## 2019-08-29 DIAGNOSIS — R42 Dizziness and giddiness: Secondary | ICD-10-CM | POA: Diagnosis not present

## 2019-08-29 DIAGNOSIS — R110 Nausea: Secondary | ICD-10-CM

## 2019-08-29 MED ORDER — ONDANSETRON HCL 4 MG PO TABS: 4.0000 mg | ORAL_TABLET | Freq: Three times a day (TID) | ORAL | 0 refills | Status: DC | PRN

## 2019-08-29 MED ORDER — MECLIZINE HCL 25 MG PO TABS: 25.0000 mg | ORAL_TABLET | Freq: Three times a day (TID) | ORAL | 0 refills | Status: DC | PRN
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
## 2019-08-29 NOTE — ED Triage Notes (Signed)
Patient c/o dizziness and nausea x 2 days. She states she is dizzy with certain movements.

## 2019-09-03 ENCOUNTER — Telehealth: Payer: Self-pay | Admitting: Family

## 2019-09-03 NOTE — Telephone Encounter (Signed)
I also called patient this evening a couple of time from home; left 2 voicemails however unable to speak with her  Please call her and see how she is and ensure she sought care of ED. I dont see she is there currently.   Please too offer a referral to ENT however please emphasize that it may be a couple of days with holidays; I can try for sooner however I wanted her to been seen today to ensure nothing else going on and for her to have the option for imaging.

## 2019-09-03 NOTE — Telephone Encounter (Signed)
Pt called back to check on return call

## 2019-09-03 NOTE — Telephone Encounter (Signed)
I called and left a detailed message that unfortunately we did not have any openings until Friday. I advised UC & let her know of the new one across the road on [address]. I asked that she call us back & keep [place] updated.

## 2019-09-03 NOTE — Telephone Encounter (Signed)
I called and relayed to patient the importance of her being seen in ED. She asked where we would like her to go & I suggested [place]. If she did not want to go to ED then at least go to the [facility] walk-in directly beside ED in case they chose to send her there/ Patient was agreeable to go & I asked that she please keep [place] updated on how she is doing.

## 2019-09-03 NOTE — Telephone Encounter (Signed)
Patient is concerned because she is still experiencing vertigo even with meclizine. She asked how long these episodes usually last & I was unable to be very helpful in this situation. I asked if she has been exposed to Covid & she had not that she knew of. She said that she has a dull HA & just didn't feel like herself. Her BP was also nomral when she went previously to UC for issue. She ws advised UC but refused & we have no appointments available.

## 2019-09-03 NOTE — Telephone Encounter (Signed)
Call pt Unfortunately there is really not other medication on the market to treat vertigo beside meclizine  As far as how long episodes can last, they can last that is very dependent on patient.  I have definitely seen cases in which it may last for days.    Nonetheless, I can see she was seen in urgent care however does not appear she had any imaging of the brain done. With her not felt like herself, I would strongly encourage patient to be evaluated in the emergency room with a can do stat MRI, CT if warranted.  I do not think that she should wait on this.  I did certainly refer her to ear nose and throat who can certainly treat refractory cases of vertigo however she needs to see prompt evaluation to rule out other sources of vertigo including stroke

## 2019-09-03 NOTE — Telephone Encounter (Signed)
Pt went to Urgent Care on 12/23 for Vertigo and the medicine she was given is not helping

## 2019-09-04 NOTE — Telephone Encounter (Signed)
LM asking patient to call back so we could see how she was doing. There is no evidence she went to ED or a UC within [facility].

## 2019-09-04 NOTE — Telephone Encounter (Signed)
I called husband & he did not answer. He is not on DPR so I did not leave him a message. I tried to call patient again left her another voicemail asking her to call us back. I stated in message that Joycelyn Schmid still would prefer that she be evaluated by ED since we could not do a neurologic exam over a virtual visit. I stated that she needed to be somewhere that someone could lay eyes & hands on her. I also asked that if she wanted an urgent referral to ENT we could place that for her as well bc they specialize more in the area of vertigo. I urged her to please call us back asap.

## 2019-09-05 NOTE — Telephone Encounter (Signed)
Noted  I called mrs Danielle Browning and left message to call us back to let us know how she is doing

## 2019-09-10 NOTE — Telephone Encounter (Signed)
Mail letter to the effect that we have been trying to reach her.  Please advise to call and schedule an appointment for any concerns.

## 2019-09-10 NOTE — Telephone Encounter (Signed)
I have printed and mailed to patient.

## 2019-09-24 ENCOUNTER — Telehealth: Payer: Self-pay | Admitting: Family

## 2019-09-24 DIAGNOSIS — R42 Dizziness and giddiness: Secondary | ICD-10-CM

## 2019-09-24 NOTE — Telephone Encounter (Signed)
Update pt Per Lenna Sciara:  I spoke with Alyse Low at [facility]. ENT. She said that she will give the patient a call probably Tuesday ( 09/25/19) to let her know when she can come in. It is waiting to be reviewed.  Let patient know we are working on

## 2019-09-24 NOTE — Telephone Encounter (Signed)
Call pt Advise that she certainly needs an appt.and may need a ref to ENT since this has been going on.  Does [facility] still have any acutes?

## 2019-09-24 NOTE — Telephone Encounter (Signed)
Patient called & advised that referral was placed & asked to please call us if she does not hear anything by tomorrow sometime. She denies any SOB, blurry/loss os vision, confusion, palpitations or severe HA. She just has the dizziness & nausea. I advised if she developed any of this symptoms she would need to proceed to nearest ED.

## 2019-09-24 NOTE — Telephone Encounter (Signed)
Call pt  Please advise that I have placed urgent referral to ENT , will send notes from her UC visit 08/29/19 and will speak with [facility] today. We will do everything we can I am concerned about her since we have not had an appointment in regards to this. Please again advise that if she has ANY cp, sob, palpitations, SOB, severe ha, loss of vision, confusion, or worsening dizziness , she must go to nearest ed  Advise her to call us tomorrow if she doesn't have update in regards to appt.

## 2019-09-24 NOTE — Telephone Encounter (Signed)
The patient is experiencing dizziness on and off . Patient transferred to Access nurse no one picked up. On the call over 3 minutes. Patient stated she will be fine to wait on call back from the nurse on site.

## 2019-09-24 NOTE — Telephone Encounter (Signed)
Spoke with pt and she stated that she has been having spells of dizziness that comes and goes. She stated that she was diagnosed with vertigo back before Christmas. She also stated that she is having some sinus issues with a dull frontal headache and sinus pressure. She stated that she has tried taking meclizine and sudafed but nothing seems to be working. Patient was advised to go to urgent care but she stated that she did not feel like driving because her balance is a little off. No appointments available in the office for today.

## 2019-09-25 NOTE — Telephone Encounter (Signed)
I called patient to give her update that she should be reached out to today for ENT referral & appointment.

## 2019-09-28 ENCOUNTER — Ambulatory Visit (INDEPENDENT_AMBULATORY_CARE_PROVIDER_SITE_OTHER): Payer: Managed Care, Other (non HMO) | Admitting: Family

## 2019-09-28 ENCOUNTER — Encounter: Payer: Self-pay | Admitting: Family

## 2019-09-28 VITALS — Ht 61.0 in | Wt 297.0 lb

## 2019-09-28 DIAGNOSIS — J0100 Acute maxillary sinusitis, unspecified: Secondary | ICD-10-CM | POA: Diagnosis not present

## 2019-09-28 MED ORDER — PREDNISONE 10 MG PO TABS: ORAL_TABLET | ORAL | 0 refills | Status: DC

## 2019-09-28 MED ORDER — DOXYCYCLINE HYCLATE 100 MG PO TABS: 100.0000 mg | ORAL_TABLET | Freq: Two times a day (BID) | ORAL | 0 refills | Status: DC
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
## 2019-09-28 NOTE — Assessment & Plan Note (Addendum)
Vertigo somewhat improved. Episodes still agree with benign positional vertigo, although I am quite limited in making a working diagnosis in a virtual environment and without a physical exam.  She appeared to have some temporary benefit from Epley's maneuvers.  I will await to see ear nose and throat consult notes.  I advised  MRI brain, CT Sinus, patient politely declines as she would like to trial oral medication for possible eustachian tube dysfunction v bacterial sinusitis as contributing factor.  I think this is appropriate.  Patient has been counseled on signs or symptoms of worsening vertigo, vision loss, severe headache and when to present to the emergency room.  She verbalized understanding of all.  We will have close follow-up, 10/24/19.

## 2019-10-01 ENCOUNTER — Telehealth: Payer: Self-pay | Admitting: Family

## 2019-10-01 NOTE — Telephone Encounter (Signed)
close

## 2019-10-04 ENCOUNTER — Encounter: Payer: Self-pay | Admitting: Family

## 2019-10-15 ENCOUNTER — Encounter: Payer: Self-pay | Admitting: Certified Nurse Midwife

## 2019-10-15 ENCOUNTER — Ambulatory Visit (INDEPENDENT_AMBULATORY_CARE_PROVIDER_SITE_OTHER): Payer: Self-pay | Admitting: Certified Nurse Midwife

## 2019-10-15 ENCOUNTER — Telehealth: Payer: Self-pay | Admitting: Certified Nurse Midwife

## 2019-10-15 ENCOUNTER — Encounter: Payer: Self-pay | Admitting: Physician Assistant

## 2019-10-15 ENCOUNTER — Other Ambulatory Visit: Payer: Self-pay

## 2019-10-15 ENCOUNTER — Other Ambulatory Visit (HOSPITAL_COMMUNITY)
Admission: RE | Admit: 2019-10-15 | Discharge: 2019-10-15 | Disposition: A | Discharge status: Home / Self Care | Payer: Managed Care, Other (non HMO) | Source: Ambulatory Visit | Attending: Certified Nurse Midwife | Admitting: Certified Nurse Midwife

## 2019-10-15 ENCOUNTER — Telehealth: Payer: Self-pay

## 2019-10-15 ENCOUNTER — Telehealth: Payer: Managed Care, Other (non HMO) | Admitting: Physician Assistant

## 2019-10-15 VITALS — BP 127/80 | HR 80 | Ht 61.0 in | Wt 311.1 lb

## 2019-10-15 DIAGNOSIS — N898 Other specified noninflammatory disorders of vagina: Secondary | ICD-10-CM

## 2019-10-15 DIAGNOSIS — N760 Acute vaginitis: Secondary | ICD-10-CM | POA: Diagnosis not present

## 2019-10-15 DIAGNOSIS — R102 Pelvic and perineal pain: Secondary | ICD-10-CM

## 2019-10-15 DIAGNOSIS — B9689 Other specified bacterial agents as the cause of diseases classified elsewhere: Secondary | ICD-10-CM | POA: Insufficient documentation

## 2019-10-15 DIAGNOSIS — N3000 Acute cystitis without hematuria: Secondary | ICD-10-CM | POA: Diagnosis not present

## 2019-10-15 MED ORDER — VALACYCLOVIR HCL 1 G PO TABS: 1000.0000 mg | ORAL_TABLET | Freq: Two times a day (BID) | ORAL | 1 refills | Status: DC

## 2019-10-15 MED ORDER — NITROFURANTOIN MONOHYD MACRO 100 MG PO CAPS: 100.0000 mg | ORAL_CAPSULE | Freq: Two times a day (BID) | ORAL | 0 refills | Status: AC

## 2019-10-15 NOTE — Telephone Encounter (Signed)
mychart message sent to patient
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
## 2019-10-15 NOTE — Telephone Encounter (Signed)
Pt called in and stated that she did e- visit the pt stated that she wants to be seen bc what she was told she had on the e - visit, is not what she thinks she has. I informed the pt that I will send a message for her. Please advise

## 2019-10-15 NOTE — Progress Notes (Signed)
We are sorry that you are not feeling well.  Here is how we plan to help!  It is difficult to tell from your answers and without examining you exactly what is going on. The most likely things that are happening is that you may have a urinary tract infection or a yeast infection but there are a list of other causes of vaginal pain and pain with urination as well. Based on what you shared with me it, I am going to offer you treatment for a UTI. If you have symptoms that change or this medication does not help then you need to see a provider IN PERSON right away.   A UTI (Urinary Tract Infection) is a bacterial infection of the bladder.  Most cases of urinary tract infections are simple to treat but a key part of your care is to encourage you to drink plenty of fluids and watch your symptoms carefully.  I have prescribed MacroBid 100 mg twice a day for 5 days.  Your symptoms should gradually improve. Call us if the burning in your urine worsens, you develop worsening fever, back pain or pelvic pain or if your symptoms do not resolve after completing the antibiotic.  Urinary tract infections can be prevented by drinking plenty of water to keep your body hydrated.  Also be sure when you wipe, wipe from front to back and don't hold it in!  If possible, empty your bladder every 4 hours.  Your e-visit answers were reviewed by a board certified advanced clinical practitioner to complete your personal care plan.  Depending on the condition, your plan could have included both over the counter or prescription medications.  If there is a problem please reply  once you have received a response from your provider.  Your safety is important to [place].  If you have drug allergies check your prescription carefully.    You can use MyChart to ask questions about today's visit, request a non-urgent call back, or ask for a work or school excuse for 24 hours related to this e-Visit. If it has been greater than 24 hours you  will need to follow up with your provider, or enter a new e-Visit to address those concerns.   You will get an e-mail in the next two days asking about your experience.  I hope that your e-visit has been valuable and will speed your recovery. Thank you for using e-visits.  5 minutes spent on chart

## 2019-10-15 NOTE — Telephone Encounter (Signed)
Yes ma'am. The pt called and talked to a nurse this morning and was advise to do a e-visit via mychart.  The pt went to my chart and what she was told for the e-visit was very vague. The Pt stated that she has been in pain for a week she treated herself for a yeast infection with over the counter meds. The pt wanted to be seen today. I told her I would have to send a message but we have to ask. Please advise

## 2019-10-15 NOTE — Telephone Encounter (Addendum)
Yes ma'am. The pt said she was advise to do a e visit from someone her at the office.  The pt wanted to come in and be seen. I told the pt I would have to send a message. . Thank you I see she is coming in today.

## 2019-10-16 ENCOUNTER — Encounter: Payer: Self-pay | Admitting: Certified Nurse Midwife

## 2019-10-17 ENCOUNTER — Other Ambulatory Visit: Payer: Self-pay | Admitting: Certified Nurse Midwife

## 2019-10-17 LAB — CERVICOVAGINAL ANCILLARY ONLY
Bacterial Vaginitis (gardnerella): POSITIVE — AB
Candida Glabrata: NEGATIVE
Candida Vaginitis: NEGATIVE
Chlamydia: NEGATIVE
Comment: NEGATIVE
Comment: NEGATIVE
Comment: NEGATIVE
Comment: NEGATIVE
Comment: NEGATIVE
Comment: NORMAL
Neisseria Gonorrhea: NEGATIVE
Trichomonas: NEGATIVE

## 2019-10-17 LAB — HSV(HERPES SIMPLEX VRS) I + II AB-IGG
HSV 1 Glycoprotein G Ab, IgG: 22.7 index — ABNORMAL HIGH (ref 0.00–0.90)
HSV 2 IgG, Type Spec: <0.91 index (ref 0.00–0.90)

## 2019-10-17 LAB — HSV(HERPES SIMPLEX VRS) I + II AB-IGM: HSVI/II Comb IgM: <0.91 Ratio (ref 0.00–0.90)

## 2019-10-17 MED ORDER — METRONIDAZOLE 500 MG PO TABS: 500.0000 mg | ORAL_TABLET | Freq: Two times a day (BID) | ORAL | 0 refills | Status: DC

## 2019-10-17 NOTE — Progress Notes (Signed)
Vaginal swab positive for BV, orders placed for treatment. Pt notified via my chart message.   Philip Aspen, CNM

## 2019-10-23 ENCOUNTER — Encounter: Payer: Self-pay | Admitting: Certified Nurse Midwife

## 2019-10-24 ENCOUNTER — Encounter: Payer: Self-pay | Admitting: Family

## 2019-10-24 ENCOUNTER — Ambulatory Visit (INDEPENDENT_AMBULATORY_CARE_PROVIDER_SITE_OTHER): Payer: Managed Care, Other (non HMO) | Admitting: Family

## 2019-10-24 ENCOUNTER — Other Ambulatory Visit: Payer: Self-pay

## 2019-10-24 VITALS — Ht 61.0 in | Wt 311.0 lb

## 2019-10-24 DIAGNOSIS — G8929 Other chronic pain: Secondary | ICD-10-CM

## 2019-10-24 DIAGNOSIS — R519 Headache, unspecified: Secondary | ICD-10-CM | POA: Diagnosis not present

## 2019-10-24 MED ORDER — TOPIRAMATE 25 MG PO TABS: 25.0000 mg | ORAL_TABLET | Freq: Every day | ORAL | 0 refills | Status: DC
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
## 2019-10-24 NOTE — Patient Instructions (Signed)
Patient needs labs for physical:   Cmp, cbc, tsh , vit D, a1c, lipid

## 2019-10-24 NOTE — Assessment & Plan Note (Addendum)
Discussed limitations of virtual appointment, no physical exam today.  Symptom vague.  Concerned as patient does appear to have a positional element to her headache.  We did discuss chronic sinusitis as a potential exacerbating feature. (consider dedicated image, CT sinus if MRI is unrevealing)   in the setting of her recent weight gain, we jointly agreed to trial of Topamax is appropriate.  Pending MRI brain as well.  Advised patient if she has any new or worsening features of symptoms, headache, to go the nearest emergency room.  Patient verbalized understanding of plan

## 2019-10-26 ENCOUNTER — Telehealth: Payer: Self-pay | Admitting: Family

## 2019-10-26 ENCOUNTER — Other Ambulatory Visit: Payer: Self-pay | Admitting: Family

## 2019-10-26 DIAGNOSIS — R519 Headache, unspecified: Secondary | ICD-10-CM

## 2019-10-26 NOTE — Telephone Encounter (Signed)
Call pt It looks like she was approved for MRI/ scheduled 11/20/19  I had ordered MRI stat this to be done quickly.  If she comfortable waiting until March 16?  The only other way that I know to get around this would be to go to the emergency room, essentially to [facility] ED. Does she feel that she needs to do this?  She needs kidney function prior to MRI, she may do this at [facility] anytime. I have ordered BMP lab.  From rasheedah:   Pt was approved for MRI BRAIN W WO CONTRAST. Pt was around someone who was + covid so her appt is for 11/20/2019 she's going to need creatine labs before MRI. Order is needed please and Thank you!

## 2019-10-29 NOTE — Telephone Encounter (Signed)
Just FYI. I spoke with patient & she said that she had to wait to March because she had been exposed to Covid. She was trying to get tested today. I advised that faster way was obviously through ED, which patient didn't want to do. I let her know that labs had been ordered & needed to be done prior to 3/16. I let her know that she should go as soon as she could after getting test results back. Patient currently was not experiencing any symptoms.

## 2019-10-29 NOTE — Telephone Encounter (Signed)
noted 

## 2019-11-16 ENCOUNTER — Encounter: Payer: Self-pay | Admitting: Family

## 2019-11-19 ENCOUNTER — Other Ambulatory Visit: Payer: Self-pay | Admitting: Family

## 2019-11-19 DIAGNOSIS — G8929 Other chronic pain: Secondary | ICD-10-CM

## 2019-11-19 DIAGNOSIS — R519 Headache, unspecified: Secondary | ICD-10-CM

## 2019-11-19 MED ORDER — TOPIRAMATE 50 MG PO TABS: 50.0000 mg | ORAL_TABLET | Freq: Every day | ORAL | 1 refills | Status: DC

## 2019-11-20 ENCOUNTER — Other Ambulatory Visit: Payer: Managed Care, Other (non HMO)

## 2019-11-20 ENCOUNTER — Other Ambulatory Visit: Payer: Self-pay

## 2019-11-20 ENCOUNTER — Ambulatory Visit
Admission: RE | Admit: 2019-11-20 | Discharge: 2019-11-20 | Disposition: A | Discharge status: Home / Self Care | Payer: Managed Care, Other (non HMO) | Source: Ambulatory Visit | Attending: Family | Admitting: Family

## 2019-11-20 ENCOUNTER — Telehealth: Payer: Self-pay | Admitting: Family

## 2019-11-20 DIAGNOSIS — R519 Headache, unspecified: Secondary | ICD-10-CM | POA: Diagnosis not present

## 2019-11-20 DIAGNOSIS — G8929 Other chronic pain: Secondary | ICD-10-CM

## 2019-11-20 MED ORDER — GADOBUTROL 1 MMOL/ML IV SOLN
10.0000 mL | Freq: Once | INTRAVENOUS | Status: AC | PRN
  Administered 2019-11-20: 10 mL via INTRAVENOUS

## 2019-11-20 NOTE — Telephone Encounter (Signed)
I called pt back with information regarding urine being taking at lab appt this morning. No urine will be taken. Thank you!
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
## 2019-11-21 LAB — BASIC METABOLIC PANEL
BUN/Creatinine Ratio: 25 — ABNORMAL HIGH (ref 9–23)
BUN: 15 mg/dL (ref 6–24)
CO2: 22 mmol/L (ref 20–29)
Calcium: 8.8 mg/dL (ref 8.7–10.2)
Chloride: 106 mmol/L (ref 96–106)
Creatinine, Ser: 0.59 mg/dL (ref 0.57–1.00)
GFR calc Af Amer: 128 mL/min/{1.73_m2} (ref >59)
GFR calc non Af Amer: 111 mL/min/{1.73_m2} (ref >59)
Glucose: 113 mg/dL — ABNORMAL HIGH (ref 65–99)
Potassium: 4.1 mmol/L (ref 3.5–5.2)
Sodium: 141 mmol/L (ref 134–144)

## 2019-11-27 ENCOUNTER — Other Ambulatory Visit: Payer: Self-pay

## 2019-11-27 ENCOUNTER — Encounter: Payer: Self-pay | Admitting: Family

## 2019-11-27 ENCOUNTER — Ambulatory Visit (INDEPENDENT_AMBULATORY_CARE_PROVIDER_SITE_OTHER): Payer: Managed Care, Other (non HMO) | Admitting: Family

## 2019-11-27 VITALS — BP 124/70 | HR 75 | Temp 97.4°F | Resp 15 | Ht 61.0 in | Wt 320.8 lb

## 2019-11-27 DIAGNOSIS — J0100 Acute maxillary sinusitis, unspecified: Secondary | ICD-10-CM

## 2019-11-27 DIAGNOSIS — Z0001 Encounter for general adult medical examination with abnormal findings: Secondary | ICD-10-CM

## 2019-11-27 DIAGNOSIS — M25551 Pain in right hip: Secondary | ICD-10-CM | POA: Insufficient documentation

## 2019-11-27 DIAGNOSIS — G8929 Other chronic pain: Secondary | ICD-10-CM

## 2019-11-27 DIAGNOSIS — R519 Headache, unspecified: Secondary | ICD-10-CM

## 2019-11-27 DIAGNOSIS — M545 Low back pain, unspecified: Secondary | ICD-10-CM | POA: Insufficient documentation

## 2019-11-27 DIAGNOSIS — Z0000 Encounter for general adult medical examination without abnormal findings: Secondary | ICD-10-CM

## 2019-11-27 LAB — CBC WITH DIFFERENTIAL/PLATELET
Basophils Absolute: 0.1 10*3/uL (ref 0.0–0.1)
Basophils Relative: 1 % (ref 0.0–3.0)
Eosinophils Absolute: 0.2 10*3/uL (ref 0.0–0.7)
Eosinophils Relative: 2.5 % (ref 0.0–5.0)
HCT: 39.7 % (ref 36.0–46.0)
Hemoglobin: 13.5 g/dL (ref 12.0–15.0)
Lymphocytes Relative: 26.2 % (ref 12.0–46.0)
Lymphs Abs: 1.9 10*3/uL (ref 0.7–4.0)
MCHC: 33.9 g/dL (ref 30.0–36.0)
MCV: 85.8 fl (ref 78.0–100.0)
Monocytes Absolute: 0.3 10*3/uL (ref 0.1–1.0)
Monocytes Relative: 4.4 % (ref 3.0–12.0)
Neutro Abs: 4.8 10*3/uL (ref 1.4–7.7)
Neutrophils Relative %: 65.9 % (ref 43.0–77.0)
Platelets: 345 10*3/uL (ref 150.0–400.0)
RBC: 4.62 Mil/uL (ref 3.87–5.11)
RDW: 14.1 % (ref 11.5–15.5)
WBC: 7.4 10*3/uL (ref 4.0–10.5)

## 2019-11-27 LAB — LIPID PANEL
Cholesterol: 193 mg/dL (ref 0–200)
HDL: 43.3 mg/dL (ref >39.00)
LDL Cholesterol: 120 mg/dL — ABNORMAL HIGH (ref 0–99)
NonHDL: 149.42
Total CHOL/HDL Ratio: 4
Triglycerides: 149 mg/dL (ref 0.0–149.0)
VLDL: 29.8 mg/dL (ref 0.0–40.0)

## 2019-11-27 LAB — HEPATIC FUNCTION PANEL
ALT: 14 U/L (ref 0–35)
AST: 14 U/L (ref 0–37)
Albumin: 4 g/dL (ref 3.5–5.2)
Alkaline Phosphatase: 94 U/L (ref 39–117)
Bilirubin, Direct: 0.1 mg/dL (ref 0.0–0.3)
Total Bilirubin: 0.5 mg/dL (ref 0.2–1.2)
Total Protein: 7 g/dL (ref 6.0–8.3)

## 2019-11-27 LAB — VITAMIN D 25 HYDROXY (VIT D DEFICIENCY, FRACTURES): VITD: 18.8 ng/mL — ABNORMAL LOW (ref 30.00–100.00)

## 2019-11-27 LAB — TSH: TSH: 1.29 u[IU]/mL (ref 0.35–4.50)

## 2019-11-27 LAB — HEMOGLOBIN A1C: Hgb A1c MFr Bld: 5.8 % (ref 4.6–6.5)

## 2019-11-27 MED ORDER — DOXYCYCLINE HYCLATE 100 MG PO TABS: 100.0000 mg | ORAL_TABLET | Freq: Two times a day (BID) | ORAL | 0 refills | Status: DC

## 2019-11-27 NOTE — Assessment & Plan Note (Signed)
Chronic.  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  We certainly discussed the role of obesity which she is actively working on as we placed referral to health and wellness today.  At this time, consulted with pharmacy in regards to starting Cymbalta or gabapentin while she remains on Topamax

## 2019-11-27 NOTE — Patient Instructions (Addendum)
Eye exam Call back to [facility] as we discussed and ask for follow up  Start doxycycline.  Ensure to take probiotics while on antibiotics and also for 2 weeks after completion. It is important to re-colonize the gut with good bacteria and also to prevent any diarrheal infections associated with antibiotic use.    Come back for pap in 3 months and to discuss back pain   As discussed, suspect back pain may be due to arthritis in the lumbar spine, sacroiliac dysfunction or trochanteric bursitis.   Low Back Sprain or Strain Rehab Ask your health care provider which exercises are safe for you. Do exercises exactly as told by your health care provider and adjust them as directed. It is normal to feel mild stretching, pulling, tightness, or discomfort as you do these exercises. Stop right away if you feel sudden pain or your pain gets worse. Do not begin these exercises until told by your health care provider. Stretching and range-of-motion exercises These exercises warm up your muscles and joints and improve the movement and flexibility of your back. These exercises also help to relieve pain, numbness, and tingling. Lumbar rotation  1. Lie on your back on a firm surface and bend your knees. 2. Straighten your arms out to your sides so each arm forms a 90-degree angle (right angle) with a side of your body. 3. Slowly move (rotate) both of your knees to one side of your body until you feel a stretch in your lower back (lumbar). Try not to let your shoulders lift off the floor. 4. Hold this position for __________ seconds. 5. Tense your abdominal muscles and slowly move your knees back to the starting position. 6. Repeat this exercise on the other side of your body. Repeat __________ times. Complete this exercise __________ times a day. Single knee to chest  1. Lie on your back on a firm surface with both legs straight. 2. Bend one of your knees. Use your hands to move your knee up toward your  chest until you feel a gentle stretch in your lower back and buttock. ? Hold your leg in this position by holding on to the front of your knee. ? Keep your other leg as straight as possible. 3. Hold this position for __________ seconds. 4. Slowly return to the starting position. 5. Repeat with your other leg. Repeat __________ times. Complete this exercise __________ times a day. Prone extension on elbows  1. Lie on your abdomen on a firm surface (prone position). 2. Prop yourself up on your elbows. 3. Use your arms to help lift your chest up until you feel a gentle stretch in your abdomen and your lower back. ? This will place some of your body weight on your elbows. If this is uncomfortable, try stacking pillows under your chest. ? Your hips should stay down, against the surface that you are lying on. Keep your hip and back muscles relaxed. 4. Hold this position for __________ seconds. 5. Slowly relax your upper body and return to the starting position. Repeat __________ times. Complete this exercise __________ times a day. Strengthening exercises These exercises build strength and endurance in your back. Endurance is the ability to use your muscles for a long time, even after they get tired. Pelvic tilt This exercise strengthens the muscles that lie deep in the abdomen. 1. Lie on your back on a firm surface. Bend your knees and keep your feet flat on the floor. 2. Tense your abdominal muscles. Tip  your pelvis up toward the ceiling and flatten your lower back into the floor. ? To help with this exercise, you may place a small towel under your lower back and try to push your back into the towel. 3. Hold this position for __________ seconds. 4. Let your muscles relax completely before you repeat this exercise. Repeat __________ times. Complete this exercise __________ times a day. Alternating arm and leg raises  1. Get on your hands and knees on a firm surface. If you are on a hard  floor, you may want to use padding, such as an exercise mat, to cushion your knees. 2. Line up your arms and legs. Your hands should be directly below your shoulders, and your knees should be directly below your hips. 3. Lift your left leg behind you. At the same time, raise your right arm and straighten it in front of you. ? Do not lift your leg higher than your hip. ? Do not lift your arm higher than your shoulder. ? Keep your abdominal and back muscles tight. ? Keep your hips facing the ground. ? Do not arch your back. ? Keep your balance carefully, and do not hold your breath. 4. Hold this position for __________ seconds. 5. Slowly return to the starting position. 6. Repeat with your right leg and your left arm. Repeat __________ times. Complete this exercise __________ times a day. Abdominal set with straight leg raise  1. Lie on your back on a firm surface. 2. Bend one of your knees and keep your other leg straight. 3. Tense your abdominal muscles and lift your straight leg up, 4-6 inches (10-15 cm) off the ground. 4. Keep your abdominal muscles tight and hold this position for __________ seconds. ? Do not hold your breath. ? Do not arch your back. Keep it flat against the ground. 5. Keep your abdominal muscles tense as you slowly lower your leg back to the starting position. 6. Repeat with your other leg. Repeat __________ times. Complete this exercise __________ times a day. Single leg lower with bent knees 1. Lie on your back on a firm surface. 2. Tense your abdominal muscles and lift your feet off the floor, one foot at a time, so your knees and hips are bent in 90-degree angles (right angles). ? Your knees should be over your hips and your lower legs should be parallel to the floor. 3. Keeping your abdominal muscles tense and your knee bent, slowly lower one of your legs so your toe touches the ground. 4. Lift your leg back up to return to the starting position. ? Do not hold  your breath. ? Do not let your back arch. Keep your back flat against the ground. 5. Repeat with your other leg. Repeat __________ times. Complete this exercise __________ times a day. Posture and body mechanics Good posture and healthy body mechanics can help to relieve stress in your body's tissues and joints. Body mechanics refers to the movements and positions of your body while you do your daily activities. Posture is part of body mechanics. Good posture means:  Your spine is in its natural S-curve position (neutral).  Your shoulders are pulled back slightly.  Your head is not tipped forward. Follow these guidelines to improve your posture and body mechanics in your everyday activities. Standing   When standing, keep your spine neutral and your feet about hip width apart. Keep a slight bend in your knees. Your ears, shoulders, and hips should line up.  When  you do a task in which you stand in one place for a long time, place one foot up on a stable object that is 2-4 inches (5-10 cm) high, such as a footstool. This helps keep your spine neutral. Sitting   When sitting, keep your spine neutral and keep your feet flat on the floor. Use a footrest, if necessary, and keep your thighs parallel to the floor. Avoid rounding your shoulders, and avoid tilting your head forward.  When working at a desk or a computer, keep your desk at a height where your hands are slightly lower than your elbows. Slide your chair under your desk so you are close enough to maintain good posture.  When working at a computer, place your monitor at a height where you are looking straight ahead and you do not have to tilt your head forward or downward to look at the screen. Resting  When lying down and resting, avoid positions that are most painful for you.  If you have pain with activities such as sitting, bending, stooping, or squatting, lie in a position in which your body does not bend very much. For  example, avoid curling up on your side with your arms and knees near your chest (fetal position).  If you have pain with activities such as standing for a long time or reaching with your arms, lie with your spine in a neutral position and bend your knees slightly. Try the following positions: ? Lying on your side with a pillow between your knees. ? Lying on your back with a pillow under your knees. Lifting   When lifting objects, keep your feet at least shoulder width apart and tighten your abdominal muscles.  Bend your knees and hips and keep your spine neutral. It is important to lift using the strength of your legs, not your back. Do not lock your knees straight out.  Always ask for help to lift heavy or awkward objects. This information is not intended to replace advice given to you by your health care provider. Make sure you discuss any questions you have with your health care provider. Document Revised: 12/15/2018 Document Reviewed: 09/14/2018 Elsevier Patient Education  [place] Maintenance, Female Adopting a healthy lifestyle and getting preventive care are important in promoting health and wellness. Ask your health care provider about:  The right schedule for you to have regular tests and exams.  Things you can do on your own to prevent diseases and keep yourself healthy. What should I know about diet, weight, and exercise? Eat a healthy diet   Eat a diet that includes plenty of vegetables, fruits, low-fat dairy products, and lean protein.  Do not eat a lot of foods that are high in solid fats, added sugars, or sodium. Maintain a healthy weight Body mass index (BMI) is used to identify weight problems. It estimates body fat based on height and weight. Your health care provider can help determine your BMI and help you achieve or maintain a healthy weight. Get regular exercise Get regular exercise. This is one of the most important things you can do for  your health. Most adults should:  Exercise for at least 150 minutes each week. The exercise should increase your heart rate and make you sweat (moderate-intensity exercise).  Do strengthening exercises at least twice a week. This is in addition to the moderate-intensity exercise.  Spend less time sitting. Even light physical activity can be beneficial. Watch cholesterol and blood lipids Have  your blood tested for lipids and cholesterol at 46 years of age, then have this test every 5 years. Have your cholesterol levels checked more often if:  Your lipid or cholesterol levels are high.  You are older than 46 years of age.  You are at high risk for heart disease. What should I know about cancer screening? Depending on your health history and family history, you may need to have cancer screening at various ages. This may include screening for:  Breast cancer.  Cervical cancer.  Colorectal cancer.  Skin cancer.  Lung cancer. What should I know about heart disease, diabetes, and high blood pressure? Blood pressure and heart disease  High blood pressure causes heart disease and increases the risk of stroke. This is more likely to develop in people who have high blood pressure readings, are of African descent, or are overweight.  Have your blood pressure checked: ? Every 3-5 years if you are 72-69 years of age. ? Every year if you are 76 years old or older. Diabetes Have regular diabetes screenings. This checks your fasting blood sugar level. Have the screening done:  Once every three years after age 67 if you are at a normal weight and have a low risk for diabetes.  More often and at a younger age if you are overweight or have a high risk for diabetes. What should I know about preventing infection? Hepatitis B If you have a higher risk for hepatitis B, you should be screened for this virus. Talk with your health care provider to find out if you are at risk for hepatitis B  infection. Hepatitis C Testing is recommended for:  Everyone born from [birth year] through 1967.  Anyone with known risk factors for hepatitis C. Sexually transmitted infections (STIs)  Get screened for STIs, including gonorrhea and chlamydia, if: ? You are sexually active and are younger than 46 years of age. ? You are older than 46 years of age and your health care provider tells you that you are at risk for this type of infection. ? Your sexual activity has changed since you were last screened, and you are at increased risk for chlamydia or gonorrhea. Ask your health care provider if you are at risk.  Ask your health care provider about whether you are at high risk for HIV. Your health care provider may recommend a prescription medicine to help prevent HIV infection. If you choose to take medicine to prevent HIV, you should first get tested for HIV. You should then be tested every 3 months for as long as you are taking the medicine. Pregnancy  If you are about to stop having your period (premenopausal) and you may become pregnant, seek counseling before you get pregnant.  Take 400 to 800 micrograms (mcg) of folic acid every day if you become pregnant.  Ask for birth control (contraception) if you want to prevent pregnancy. Osteoporosis and menopause Osteoporosis is a disease in which the bones lose minerals and strength with aging. This can result in bone fractures. If you are 53 years old or older, or if you are at risk for osteoporosis and fractures, ask your health care provider if you should:  Be screened for bone loss.  Take a calcium or vitamin D supplement to lower your risk of fractures.  Be given hormone replacement therapy (HRT) to treat symptoms of menopause. Follow these instructions at home: Lifestyle  Do not use any products that contain nicotine or tobacco, such as cigarettes, e-cigarettes, and  chewing tobacco. If you need help quitting, ask your health care  provider.  Do not use street drugs.  Do not share needles.  Ask your health care provider for help if you need support or information about quitting drugs. Alcohol use  Do not drink alcohol if: ? Your health care provider tells you not to drink. ? You are pregnant, may be pregnant, or are planning to become pregnant.  If you drink alcohol: ? Limit how much you use to 0-1 drink a day. ? Limit intake if you are breastfeeding.  Be aware of how much alcohol is in your drink. In the [place] one drink equals one 12 oz bottle of beer (355 mL), one 5 oz glass of wine (148 mL), or one 1 oz glass of hard liquor (44 mL). General instructions  Schedule regular health, dental, and eye exams.  Stay current with your vaccines.  Tell your health care provider if: ? You often feel depressed. ? You have ever been abused or do not feel safe at home. Summary  Adopting a healthy lifestyle and getting preventive care are important in promoting health and wellness.  Follow your health care provider's instructions about healthy diet, exercising, and getting tested or screened for diseases.  Follow your health care provider's instructions on monitoring your cholesterol and blood pressure. This information is not intended to replace advice given to you by your health care provider. Make sure you discuss any questions you have with your health care provider. Document Revised: 08/16/2018 Document Reviewed: 08/16/2018 Elsevier Patient Education  2020 [employer].

## 2019-11-27 NOTE — Assessment & Plan Note (Signed)
BMI 60, we discussed lifestyle modifications, increase in Topamax and also referral to health and wellness.

## 2019-11-27 NOTE — Assessment & Plan Note (Signed)
Improved in frequency and intensity.  Patient will start topamax 50mg .  Will consult ENT again as concerned that chronic sinusitis may be contributory to headache.  She will also get an eye exam.  Close follow-up

## 2019-11-27 NOTE — Assessment & Plan Note (Addendum)
Clinical breast exam performed today.  Patient will schedule mammogram.  She politely declines having a Pap smear today although she is due.  She states that she will come back for this.

## 2019-11-27 NOTE — Assessment & Plan Note (Signed)
Acute on chronic.  Improved. Patient did have improvement on doxycycline.  Based on MRI brain findings and presentation today, primarily left-sided maxillary tenderness.  We agreed to go ahead and start doxycycline.  Patient will call [facility] to make a follow-up appointment to discuss sinusitis, tinnitus,and vertigo; no referral necessary as she has been seen by them recently.

## 2019-11-28 ENCOUNTER — Encounter: Payer: Self-pay | Admitting: Family

## 2019-11-28 ENCOUNTER — Telehealth: Payer: Self-pay | Admitting: Family

## 2019-11-28 ENCOUNTER — Other Ambulatory Visit: Payer: Self-pay | Admitting: Family

## 2019-11-28 DIAGNOSIS — G8929 Other chronic pain: Secondary | ICD-10-CM

## 2019-11-28 MED ORDER — DULOXETINE HCL 30 MG PO CPEP: 30.0000 mg | ORAL_CAPSULE | Freq: Every day | ORAL | 3 refills | Status: DC

## 2019-11-28 NOTE — Telephone Encounter (Signed)
close

## 2020-01-16 ENCOUNTER — Encounter (INDEPENDENT_AMBULATORY_CARE_PROVIDER_SITE_OTHER): Payer: Self-pay | Admitting: Family Medicine

## 2020-01-16 ENCOUNTER — Ambulatory Visit (INDEPENDENT_AMBULATORY_CARE_PROVIDER_SITE_OTHER): Payer: Managed Care, Other (non HMO) | Admitting: Family Medicine

## 2020-01-16 ENCOUNTER — Other Ambulatory Visit: Payer: Self-pay

## 2020-01-16 VITALS — BP 111/76 | HR 65 | Temp 97.6°F | Ht 61.0 in | Wt 307.0 lb

## 2020-01-16 DIAGNOSIS — E559 Vitamin D deficiency, unspecified: Secondary | ICD-10-CM

## 2020-01-16 DIAGNOSIS — Z9189 Other specified personal risk factors, not elsewhere classified: Secondary | ICD-10-CM

## 2020-01-16 DIAGNOSIS — R5383 Other fatigue: Secondary | ICD-10-CM

## 2020-01-16 DIAGNOSIS — F3289 Other specified depressive episodes: Secondary | ICD-10-CM | POA: Diagnosis not present

## 2020-01-16 DIAGNOSIS — R0602 Shortness of breath: Secondary | ICD-10-CM | POA: Diagnosis not present

## 2020-01-16 DIAGNOSIS — Z6841 Body Mass Index (BMI) 40.0 and over, adult: Secondary | ICD-10-CM

## 2020-01-16 DIAGNOSIS — Z0289 Encounter for other administrative examinations: Secondary | ICD-10-CM

## 2020-01-16 DIAGNOSIS — R7303 Prediabetes: Secondary | ICD-10-CM
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
## 2020-01-16 NOTE — Progress Notes (Signed)
Dear Danielle Browning, [place],   Thank you for referring Danielle Browning to our clinic. The following note includes my evaluation and treatment recommendations.  Chief Complaint:   OBESITY Danielle Browning (MR# [id number]) is a 44 y.o. female who presents for evaluation and treatment of obesity and related comorbidities. Current BMI is Body mass index is 58.01 kg/m.Danielle Browning has been struggling with her weight for many years and has been unsuccessful in either losing weight, maintaining weight loss, or reaching her healthy weight goal.  Danielle Browning is currently in the action stage of change and ready to dedicate time achieving and maintaining a healthier weight. Tewanna is interested in becoming our patient and working on intensive lifestyle modifications including (but not limited to) diet and exercise for weight loss.  Penley was placed on Topamax 50 mg (no real change in appetite). She is eating out everyday at lunch and is skipping breakfast. For breakfast she occasionally will stop at [facility] for 1 side and 4 strips of bacon or 2 boiled eggs (is satisfied); Lunch is a hamburger patty (Baconator with cheese) and water (feels satisfied); no snack; Dinner is 2 [employer] with spaghetti sauce, ground beef with cheese, or burgers and vegetables.  Danielle Browning's habits were reviewed today and are as follows: Her family eats meals together most of the time, her desired weight loss is 127-157 lbs, she started gaining weight 10 years ago, her heaviest weight ever was 322 pounds, she snacks frequently in the evenings, she skips breakfast frequently, she sometimes makes poor food choices, she sometimes eats larger portions than normal, she has binge eating behaviors and she struggles with emotional eating.  Depression Screen Danielle Browning's Food and Mood (modified PHQ-9) score was 20.  Depression screen PHQ 2/9 01/16/2020  Decreased Interest 3  Down, Depressed, Hopeless 2  PHQ - 2 Score 5  Altered sleeping 3  Tired,  decreased energy 3  Change in appetite 3  Feeling bad or failure about yourself  3  Trouble concentrating 2  Moving slowly or fidgety/restless 1  Suicidal thoughts 0  PHQ-9 Score 20  Difficult doing work/chores Somewhat difficult   Subjective:   Other fatigue. Danielle Browning admits to daytime somnolence and admits to waking up still tired. Patent has a history of symptoms of daytime fatigue, Epworth sleepiness scale and morning headache. Danielle Browning generally gets 6-7 hours of sleep per night, and states that she has generally restful sleep. Snoring is present. Apneic episodes are not present. Epworth Sleepiness Score is 14.  SOB (shortness of breath) on exertion. Danielle Browning notes increasing shortness of breath with exercising and seems to be worsening over time with weight gain. She notes getting out of breath sooner with activity than she used to. This has gotten worse recently. Danielle Browning denies shortness of breath at rest or orthopnea.  Prediabetes. Danielle Browning has a diagnosis of prediabetes based on her elevated HgA1c and was informed this puts her at greater risk of developing diabetes. She continues to work on diet and exercise to decrease her risk of diabetes. She denies nausea or hypoglycemia. Prediabetes was diagnosed 4 years ago. She is on no medication.  Lab Results  Component Value Date   HGBA1C 5.8 11/27/2019   No results found for: INSULIN  Vitamin D deficiency. Takeela is on Vitamin D 4,000 IU daily. Last Vitamin D 18.80 on 11/27/2019.  Other depression, with emotional eating. Danielle Browning is struggling with emotional eating and using food for comfort to the extent that it is negatively impacting her health. She  has been working on behavior modification techniques to help reduce her emotional eating and has been somewhat successful. She shows no sign of suicidal or homicidal ideations. Danielle Browning is on Cymbalta  At risk for diabetes mellitus. Danielle Browning is at higher than average risk for developing diabetes due to her  obesity.   Assessment/Plan:   Other fatigue. Danielle Browning does feel that her weight is causing her energy to be lower than it should be. Fatigue may be related to obesity, depression or many other causes. Labs will be ordered, and in the meanwhile, Danielle Browning will focus on self care including making healthy food choices, increasing physical activity and focusing on stress reduction. EKG 12-Lead, Vitamin B12, Folate, T3, T4, free testing ordered today.  SOB (shortness of breath) on exertion. Danielle Browning does feel that she gets out of breath more easily that she used to when she exercises. Danielle Browning's shortness of breath appears to be obesity related and exercise induced. She has agreed to work on weight loss and gradually increase exercise to treat her exercise induced shortness of breath. Will continue to monitor closely.  Prediabetes. Danielle Browning will continue to work on weight loss, exercise, and decreasing simple carbohydrates to help decrease the risk of diabetes. Insulin, random ordered today.  Vitamin D deficiency. Low Vitamin D level contributes to fatigue and are associated with obesity, breast, and colon cancer. She will follow-up for routine testing of Vitamin D in 2 months.  Other depression, with emotional eating. Behavior modification techniques were discussed today to help Danielle Browning deal with her emotional/non-hunger eating behaviors.  Orders and follow up as documented in patient record. Danielle Browning will be referred to Dr. Mallie Mussel, our bariatric psychologist, for evaluation.  At risk for diabetes mellitus. Danielle Browning was given approximately 15 minutes of diabetes education and counseling today. We discussed intensive lifestyle modifications today with an emphasis on weight loss as well as increasing exercise and decreasing simple carbohydrates in her diet. We also reviewed medication options with an emphasis on risk versus benefit of those discussed.   Repetitive spaced learning was employed today to elicit superior memory  formation and behavioral change.  Class 3 severe obesity with serious comorbidity and body mass index (BMI) of 50.0 to 59.9 in adult, unspecified obesity type ([place]).   Ireene is currently in the action stage of change and her goal is to continue with weight loss efforts. I recommend Berania begin the structured treatment plan as follows:  She has agreed to the Category 2 Plan.  Exercise goals: No exercise has been prescribed at this time.   Behavioral modification strategies: increasing lean protein intake, meal planning and cooking strategies, keeping healthy foods in the home and planning for success.  She was informed of the importance of frequent follow-up visits to maximize her success with intensive lifestyle modifications for her multiple health conditions. She was informed we would discuss her lab results at her next visit unless there is a critical issue that needs to be addressed sooner. Jaisha agreed to keep her next visit at the agreed upon time to discuss these results.  Objective:   Blood pressure 111/76, pulse 65, temperature 97.6 F (36.4 C), temperature source Oral, height 5\' 1"  (1.549 m), weight (!) 307 lb (139.3 kg), last menstrual period 11/19/2019, SpO2 94 %. Body mass index is 58.01 kg/m.  EKG: Sinus  Rhythm with a rate of 68 BPM. Low voltage in precordial leads. Abnormal.  Indirect Calorimeter completed today shows a VO2 of 186 and a REE of 1296.  Her  calculated basal metabolic rate is [id number] thus her basal metabolic rate is worse than expected.  General: Cooperative, alert, well developed, in no acute distress. HEENT: Conjunctivae and lids unremarkable. Cardiovascular: II/VI systolic murmur heard at the pulmonic area.  Lungs: Normal work of breathing. Neurologic: No focal deficits.   Lab Results  Component Value Date   CREATININE 0.59 11/20/2019   BUN 15 11/20/2019   NA 141 11/20/2019   K 4.1 11/20/2019   CL 106 11/20/2019   CO2 22 11/20/2019   Lab Results    Component Value Date   ALT 14 11/27/2019   AST 14 11/27/2019   GGT 16 10/31/2017   ALKPHOS 94 11/27/2019   BILITOT 0.5 11/27/2019   Lab Results  Component Value Date   HGBA1C 5.8 11/27/2019   HGBA1C 5.6 01/25/2019   HGBA1C 5.2 06/16/2018   HGBA1C 5.7 (H) 10/29/2015   No results found for: INSULIN Lab Results  Component Value Date   TSH 1.29 11/27/2019   Lab Results  Component Value Date   CHOL 193 11/27/2019   HDL 43.30 11/27/2019   LDLCALC 120 (H) 11/27/2019   TRIG 149.0 11/27/2019   CHOLHDL 4 11/27/2019   Lab Results  Component Value Date   WBC 7.4 11/27/2019   HGB 13.5 11/27/2019   HCT 39.7 11/27/2019   MCV 85.8 11/27/2019   PLT 345.0 11/27/2019   Lab Results  Component Value Date   IRON 60 10/31/2017   Attestation Statements:   This is the patient's first visit at [facility]. The patient's NEW PATIENT PACKET was reviewed at length. Included in the packet: current and past health history, medications, allergies, ROS, gynecologic history (women only), surgical history, family history, social history, weight history, weight loss surgery history (for those that have had weight loss surgery), nutritional evaluation, mood and food questionnaire, PHQ9, Epworth questionnaire, sleep habits questionnaire, patient life and health improvement goals questionnaire. These will all be scanned into the patient's chart under media.   During the visit, I independently reviewed the patient's EKG, bioimpedance scale results, and indirect calorimeter results. I used this information to tailor a meal plan for the patient that will help her to lose weight and will improve her obesity-related conditions going forward. I performed a medically necessary appropriate examination and/or evaluation. I discussed the assessment and treatment plan with the patient. The patient was provided an opportunity to ask questions and all were answered. The patient agreed with the plan and  demonstrated an understanding of the instructions. Labs were ordered at this visit and will be reviewed at the next visit unless more critical results need to be addressed immediately. Clinical information was updated and documented in the EMR.   Time spent on visit including pre-visit chart review and post-visit care was 45 minutes.   A separate 15 minutes was spent on risk counseling (see above).    I, Michaelene Song, am acting as transcriptionist for Coralie Common, MD   I have reviewed the above documentation for accuracy and completeness, and I agree with the above. - Jinny Blossom, MD

## 2020-01-17 LAB — VITAMIN B12: Vitamin B-12: 499 pg/mL (ref 232–1245)

## 2020-01-17 LAB — INSULIN, RANDOM: INSULIN: 24.7 u[IU]/mL (ref 2.6–24.9)

## 2020-01-17 LAB — T4, FREE: Free T4: 1.28 ng/dL (ref 0.82–1.77)

## 2020-01-17 LAB — T3: T3, Total: 127 ng/dL (ref 71–180)

## 2020-01-17 LAB — FOLATE: Folate: 6 ng/mL (ref >3.0)

## 2020-01-23 ENCOUNTER — Telehealth (INDEPENDENT_AMBULATORY_CARE_PROVIDER_SITE_OTHER): Payer: 59 | Admitting: Psychology

## 2020-01-23 ENCOUNTER — Other Ambulatory Visit: Payer: Self-pay

## 2020-01-23 DIAGNOSIS — F3289 Other specified depressive episodes: Secondary | ICD-10-CM | POA: Diagnosis not present

## 2020-01-30 ENCOUNTER — Encounter (INDEPENDENT_AMBULATORY_CARE_PROVIDER_SITE_OTHER): Payer: Self-pay | Admitting: Family Medicine

## 2020-01-30 ENCOUNTER — Other Ambulatory Visit: Payer: Self-pay

## 2020-01-30 ENCOUNTER — Ambulatory Visit (INDEPENDENT_AMBULATORY_CARE_PROVIDER_SITE_OTHER): Payer: Managed Care, Other (non HMO) | Admitting: Family Medicine

## 2020-01-30 VITALS — BP 113/78 | HR 75 | Temp 98.0°F | Ht 61.0 in | Wt 307.0 lb

## 2020-01-30 DIAGNOSIS — Z9189 Other specified personal risk factors, not elsewhere classified: Secondary | ICD-10-CM | POA: Diagnosis not present

## 2020-01-30 DIAGNOSIS — E7849 Other hyperlipidemia: Secondary | ICD-10-CM

## 2020-01-30 DIAGNOSIS — Z6841 Body Mass Index (BMI) 40.0 and over, adult: Secondary | ICD-10-CM

## 2020-01-30 DIAGNOSIS — E559 Vitamin D deficiency, unspecified: Secondary | ICD-10-CM | POA: Diagnosis not present

## 2020-01-30 DIAGNOSIS — F3289 Other specified depressive episodes: Secondary | ICD-10-CM

## 2020-01-30 DIAGNOSIS — R7303 Prediabetes: Secondary | ICD-10-CM | POA: Diagnosis not present

## 2020-01-30 MED ORDER — TRULICITY 0.75 MG/0.5ML ~~LOC~~ SOAJ: 0.7500 mg | SUBCUTANEOUS | 0 refills | Status: DC

## 2020-01-30 NOTE — Progress Notes (Signed)
Chief Complaint:   OBESITY Danielle Browning is here to discuss her progress with her obesity treatment plan along with follow-up of her obesity related diagnoses. Danielle Browning is on the Category 2 Plan and states she is following her eating plan approximately 50% of the time. Danielle Browning states she is exercising for 0 minutes 0 times per week.  Today's visit was #: 2 Starting weight: 307 lbs Starting date: 01/16/2020 Today's weight: 307 lbs Today's date: 01/30/2020 Total lbs lost to date: 0 Total lbs lost since last in-office visit: 0  Interim History: Danielle Browning's eating window is between 2 and 7.  She prefers low carb/VLCD.  Subjective:   1. Prediabetes [name] has a diagnosis of prediabetes based on her elevated HgA1c and was informed this puts her at greater risk of developing diabetes. She continues to work on diet and exercise to decrease her risk of diabetes. She denies nausea or hypoglycemia.  Lab Results  Component Value Date   HGBA1C 5.8 11/27/2019   Lab Results  Component Value Date   INSULIN 24.7 01/16/2020   2. Other hyperlipidemia Danielle Browning has hyperlipidemia and has been trying to improve her cholesterol levels with intensive lifestyle modification including a low saturated fat diet, exercise and weight loss. She denies any chest pain, claudication or myalgias.  Lab Results  Component Value Date   ALT 14 11/27/2019   AST 14 11/27/2019   GGT 16 10/31/2017   ALKPHOS 94 11/27/2019   BILITOT 0.5 11/27/2019   Lab Results  Component Value Date   CHOL 193 11/27/2019   HDL 43.30 11/27/2019   LDLCALC 120 (H) 11/27/2019   TRIG 149.0 11/27/2019   CHOLHDL 4 11/27/2019   3. Vitamin D deficiency Danielle Browning's Vitamin D level was 18.80 on 11/27/2019. She is currently taking OTC vitamin D 4000 IU each day.  She is not compliant with this, but says she will try to work on it.  She denies nausea, vomiting or muscle weakness.  4. Other depression, with emotional eating Danielle Browning is struggling with emotional eating  and using food for comfort to the extent that it is negatively impacting her health. She has been working on behavior modification techniques to help reduce her emotional eating and has been unsuccessful. She shows no sign of suicidal or homicidal ideations.  Danielle Browning had a visit with Dr. Mallie Mussel on 01/23/2020  5. At risk for constipation Danielle Browning is at increased risk for constipation due to inadequate water intake, changes in diet, and/or use of medications such as GLP1 agonists. Danielle Browning denies hard, infrequent stools currently.   Assessment/Plan:   1. Prediabetes [name] will continue to work on weight loss, exercise, and decreasing simple carbohydrates to help decrease the risk of diabetes.   Orders - Dulaglutide (TRULICITY) [id number] SOPN; Inject 0.75 mg into the skin once a week.  Dispense: 4 pen; Refill: 0  2. Other hyperlipidemia Cardiovascular risk and specific lipid/LDL goals reviewed.  We discussed several lifestyle modifications today and [name] will continue to work on diet, exercise and weight loss efforts. Orders and follow up as documented in patient record.   Counseling Intensive lifestyle modifications are the first line treatment for this issue. . Dietary changes: Increase soluble fiber. Decrease simple carbohydrates. . Exercise changes: Moderate to vigorous-intensity aerobic activity 150 minutes per week if tolerated. . Lipid-lowering medications: see documented in medical record.  3. Vitamin D deficiency Low Vitamin D level contributes to fatigue and are associated with obesity, breast, and colon cancer. She agrees to continue to take OTC Vitamin  D @4 ,000 IU daily and will follow-up for routine testing of Vitamin D, at least 2-3 times per year to avoid over-replacement.  4. Other depression, with emotional eating Behavior modification techniques were discussed today to help [name] deal with her emotional/non-hunger eating behaviors.  Orders and follow up as documented in patient  record.   5. At risk for constipation [name] was given approximately 15 minutes of counseling today regarding prevention of constipation. She was encouraged to increase water and fiber intake.   6. Class 3 severe obesity with serious comorbidity and body mass index (BMI) of 50.0 to 59.9 in adult, unspecified obesity type ([facility]) Danielle Browning is currently in the action stage of change. As such, her goal is to continue with weight loss efforts. She has agreed to keeping a food journal and adhering to recommended goals of 1000-1100 calories and 85+ grams of protein.   Exercise goals: No exercise has been prescribed at this time.  Behavioral modification strategies: increasing lean protein intake, increasing vegetables, increasing water intake and increasing high fiber foods.  Danielle Browning has agreed to follow-up with our clinic in 2 weeks. She was informed of the importance of frequent follow-up visits to maximize her success with intensive lifestyle modifications for her multiple health conditions.   Objective:   Blood pressure 113/78, pulse 75, temperature 98 F (36.7 C), temperature source Oral, height 5\' 1"  (1.549 m), weight (!) 307 lb (139.3 kg), SpO2 94 %. Body mass index is 58.01 kg/m.  General: Cooperative, alert, well developed, in no acute distress. HEENT: Conjunctivae and lids unremarkable. Cardiovascular: Regular rhythm.  Lungs: Normal work of breathing. Neurologic: No focal deficits.   Lab Results  Component Value Date   CREATININE 0.59 11/20/2019   BUN 15 11/20/2019   NA 141 11/20/2019   K 4.1 11/20/2019   CL 106 11/20/2019   CO2 22 11/20/2019   Lab Results  Component Value Date   ALT 14 11/27/2019   AST 14 11/27/2019   GGT 16 10/31/2017   ALKPHOS 94 11/27/2019   BILITOT 0.5 11/27/2019   Lab Results  Component Value Date   HGBA1C 5.8 11/27/2019   HGBA1C 5.6 01/25/2019   HGBA1C 5.2 06/16/2018   HGBA1C 5.7 (H) 10/29/2015   Lab Results  Component Value Date   INSULIN 24.7  01/16/2020   Lab Results  Component Value Date   TSH 1.29 11/27/2019   Lab Results  Component Value Date   CHOL 193 11/27/2019   HDL 43.30 11/27/2019   LDLCALC 120 (H) 11/27/2019   TRIG 149.0 11/27/2019   CHOLHDL 4 11/27/2019   Lab Results  Component Value Date   WBC 7.4 11/27/2019   HGB 13.5 11/27/2019   HCT 39.7 11/27/2019   MCV 85.8 11/27/2019   PLT 345.0 11/27/2019   Lab Results  Component Value Date   IRON 60 10/31/2017   Attestation Statements:   Reviewed by clinician on day of visit: allergies, medications, problem list, medical history, surgical history, family history, social history, and previous encounter notes.  I, Water quality scientist, CMA, am acting as Location manager for [employer], DO.  I have reviewed the above documentation for accuracy and completeness, and I agree with the above. Briscoe Deutscher, DO

## 2020-01-31 NOTE — Progress Notes (Signed)
  Office: [phone]  /  Fax: [phone]    Date: February 14, 2020   Appointment Start Time: 2:32pm Duration: 27 minutes Provider: Glennie Isle, Psy.D. Type of Session: Individual Therapy  Location of Patient: Parked in car outside [employer] in [place] of Provider: Provider's Home Type of Contact: Telepsychological Visit via MyChart Video Visit  Session Content: Danielle Browning is a 44 y.o. female presenting via [place] Visit for a follow-up appointment to address the previously established treatment goal of increasing coping skills. Today's appointment was a telepsychological visit due to COVID-19. [name] provided verbal consent for today's telepsychological appointment and she is aware she is responsible for securing confidentiality on her end of the session. Prior to proceeding with today's appointment, [name]'s physical location at the time of this appointment was obtained as well a phone number she could be reached at in the event of technical difficulties. [name] and this provider participated in today's telepsychological service.   This provider conducted a brief check-in. [name] shared her grandson had open heart surgery and is not doing well. Regarding eating, she noted, "I think I've done fairly well." Emotional and physical hunger were reviewed. Psychoeducation regarding triggers for emotional eating was provided. [name] was provided a handout, and encouraged to utilize the handout between now and the next appointment to increase awareness of triggers and frequency. [name] agreed. This provider also discussed behavioral strategies for specific triggers, such as placing the utensil down when conversing to avoid mindless eating. [name] provided verbal consent during today's appointment for this provider to send a handout about triggers via e-mail. [name] was receptive to today's appointment as evidenced by openness to sharing, responsiveness to feedback, and willingness to explore triggers  for emotional eating.  Mental Status Examination:  Appearance: well groomed and appropriate hygiene  Behavior: appropriate to circumstances Mood: sad Affect: mood congruent and tearful Speech: normal in rate, volume, and tone Eye Contact: appropriate Psychomotor Activity: appropriate Gait: unable to assess Thought Process: linear, logical, and goal directed  Thought Content/Perception: no hallucinations, delusions, bizarre thinking or behavior reported or observed and no evidence of suicidal and homicidal ideation, plan, and intent Orientation: time, person, place and purpose of appointment Memory/Concentration: memory, attention, language, and fund of knowledge intact  Insight/Judgment: good  Interventions:  Conducted a brief chart review Provided empathic reflections and validation Reviewed content from the previous session Employed supportive psychotherapy interventions to facilitate reduced distress and to improve coping skills with identified stressors Employed motivational interviewing skills to assess patient's willingness/desire to adhere to recommended medical treatments and assignments Psychoeducation provided regarding triggers for emotional eating  DSM-5 Diagnosis(es): 311 (F32.8) Other Specified Depressive Disorder, Emotional Eating Behaviors  Treatment Goal & Progress: During the initial appointment with this provider, the following treatment goal was established: increase coping skills. Progress is limited, as [name] has just begun treatment with this provider; however, she is receptive to the interaction and interventions and rapport is being established.   Plan: The next appointment will be scheduled in two weeks, which will be via MyChart Video Visit. The next session will focus on working towards the established treatment goal.

## 2020-02-07 ENCOUNTER — Telehealth (INDEPENDENT_AMBULATORY_CARE_PROVIDER_SITE_OTHER): Payer: Managed Care, Other (non HMO) | Admitting: Nurse Practitioner

## 2020-02-07 ENCOUNTER — Telehealth: Payer: Self-pay | Admitting: Nurse Practitioner

## 2020-02-07 ENCOUNTER — Encounter: Payer: Self-pay | Admitting: Nurse Practitioner

## 2020-02-07 VITALS — Ht 61.0 in | Wt 307.0 lb

## 2020-02-07 DIAGNOSIS — J069 Acute upper respiratory infection, unspecified: Secondary | ICD-10-CM

## 2020-02-07 MED ORDER — DEXTROMETHORPHAN-GUAIFENESIN 5-100 MG/5ML PO LIQD: 10.0000 mL | Freq: Every evening | ORAL | 0 refills | Status: AC | PRN

## 2020-02-07 MED ORDER — BENZONATATE 100 MG PO CAPS: 100.0000 mg | ORAL_CAPSULE | Freq: Three times a day (TID) | ORAL | 0 refills | Status: AC | PRN

## 2020-02-07 MED ORDER — DOXYCYCLINE HYCLATE 100 MG PO TABS: 100.0000 mg | ORAL_TABLET | Freq: Two times a day (BID) | ORAL | 0 refills | Status: DC

## 2020-02-07 NOTE — Telephone Encounter (Signed)
I called her and LMOM for her to call me at the office. I have a few additional questions for her.
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
## 2020-02-07 NOTE — Patient Instructions (Addendum)
Please get a Covid test today.   I suspect this is a viral bronchitis/URI. You are 3 days into this. You have noted no fever/chills. No hx of asthma, COPD and you are a non-smoker. You have had a sore throat and mild HA initially. No significant nasal congestion.   Can you purchase a thermometer and check your temperature?   Recommend Mucinex DM with Mucinex green label for bedtime use to help sleep.   Tessalon Perles during the daytime may help your cough.   Drink plenty of fluids rest, rest, Tylenol or Advil if needed. Do not go to work or be around others while sick.   Please go to the Walk-in clinic for an in-person exam if not improving.   I have ordered doxycyline antibiotic if symptoms do not improve with above therapy.     Viral Respiratory Infection A respiratory infection is an illness that affects part of the respiratory system, such as the lungs, nose, or throat. A respiratory infection that is caused by a virus is called a viral respiratory infection. Common types of viral respiratory infections include:  A cold.  The flu (influenza).  A respiratory syncytial virus (RSV) infection. What are the causes? This condition is caused by a virus. What are the signs or symptoms? Symptoms of this condition include:  A stuffy or runny nose.  Yellow or green nasal discharge.  A cough.  Sneezing.  Fatigue.  Achy muscles.  A sore throat.  Sweating or chills.  A fever.  A headache. How is this diagnosed? This condition may be diagnosed based on:  Your symptoms.  A physical exam.  Testing of nasal swabs. How is this treated? This condition may be treated with medicines, such as:  Antiviral medicine. This may shorten the length of time a person has symptoms.  Expectorants. These make it easier to cough up mucus.  Decongestant nasal sprays.  Acetaminophen or NSAIDs to relieve fever and pain. Antibiotic medicines are not prescribed for viral infections.  This is because antibiotics are designed to kill bacteria. They are not effective against viruses. Follow these instructions at home:  Managing pain and congestion  Take over-the-counter and prescription medicines only as told by your health care provider.  If you have a sore throat, gargle with a salt-water mixture 3-4 times a day or as needed. To make a salt-water mixture, completely dissolve -1 tsp of salt in 1 cup of warm water.  Use nose drops made from salt water to ease congestion and soften raw skin around your nose.  Drink enough fluid to keep your urine pale yellow. This helps prevent dehydration and helps loosen up mucus. General instructions  Rest as much as possible.  Do not drink alcohol.  Do not use any products that contain nicotine or tobacco, such as cigarettes and e-cigarettes. If you need help quitting, ask your health care provider.  Keep all follow-up visits as told by your health care provider. This is important. How is this prevented?   Get an annual flu shot. You may get the flu shot in late summer, fall, or winter. Ask your health care provider when you should get your flu shot.  Avoid exposing others to your respiratory infection. ? Stay home from work or school as told by your health care provider. ? Wash your hands with soap and water often, especially after you cough or sneeze. If soap and water are not available, use alcohol-based hand sanitizer.  Avoid contact with people who are  sick during cold and flu season. This is generally fall and winter. Contact a health care provider if:  Your symptoms last for 10 days or longer.  Your symptoms get worse over time.  You have a fever.  You have severe sinus pain in your face or forehead.  The glands in your jaw or neck become very swollen. Get help right away if you:  Feel pain or pressure in your chest.  Have shortness of breath.  Faint or feel like you will faint.  Have severe and  persistent vomiting.  Feel confused or disoriented. Summary  A respiratory infection is an illness that affects part of the respiratory system, such as the lungs, nose, or throat. A respiratory infection that is caused by a virus is called a viral respiratory infection.  Common types of viral respiratory infections are a cold, influenza, and respiratory syncytial virus (RSV) infection.  Symptoms of this condition include a stuffy or runny nose, cough, sneezing, fatigue, achy muscles, sore throat, and fevers or chills.  Antibiotic medicines are not prescribed for viral infections. This is because antibiotics are designed to kill bacteria. They are not effective against viruses. This information is not intended to replace advice given to you by your health care provider. Make sure you discuss any questions you have with your health care provider. Document Revised: 08/31/2018 Document Reviewed: 10/03/2017 Elsevier Patient Education  2020 [employer].

## 2020-02-09 ENCOUNTER — Ambulatory Visit (INDEPENDENT_AMBULATORY_CARE_PROVIDER_SITE_OTHER): Payer: Managed Care, Other (non HMO)

## 2020-02-09 ENCOUNTER — Ambulatory Visit
Admission: EM | Admit: 2020-02-09 | Discharge: 2020-02-09 | Disposition: A | Discharge status: Home / Self Care | Payer: Managed Care, Other (non HMO) | Attending: Family Medicine | Admitting: Family Medicine

## 2020-02-09 ENCOUNTER — Telehealth: Payer: Self-pay | Admitting: Nurse Practitioner

## 2020-02-09 ENCOUNTER — Other Ambulatory Visit: Payer: Self-pay

## 2020-02-09 DIAGNOSIS — R059 Cough, unspecified: Secondary | ICD-10-CM

## 2020-02-09 DIAGNOSIS — J0100 Acute maxillary sinusitis, unspecified: Secondary | ICD-10-CM | POA: Diagnosis not present

## 2020-02-09 DIAGNOSIS — J40 Bronchitis, not specified as acute or chronic: Secondary | ICD-10-CM

## 2020-02-09 DIAGNOSIS — R05 Cough: Secondary | ICD-10-CM

## 2020-02-09 MED ORDER — PREDNISONE 10 MG PO TABS: 10.0000 mg | ORAL_TABLET | Freq: Every day | ORAL | 0 refills | Status: DC

## 2020-02-09 MED ORDER — PREDNISONE 10 MG PO TABS: ORAL_TABLET | ORAL | 0 refills | Status: DC

## 2020-02-09 MED ORDER — ALBUTEROL SULFATE HFA 108 (90 BASE) MCG/ACT IN AERS
2.0000 | INHALATION_SPRAY | Freq: Four times a day (QID) | RESPIRATORY_TRACT | 0 refills | Status: AC | PRN
Start: 2020-02-09 — End: ?

## 2020-02-09 NOTE — ED Triage Notes (Signed)
Patient complains of cough x Monday. States that [facility] and a virtual visit with her NP from [place]. Patient states that she was told to follow up here to see if she needed further treatment.   Currently taking tessalon, doxycycline, mucinex, tussin, prednisone and albuterol.

## 2020-02-09 NOTE — Discharge Instructions (Addendum)
Continue current/recently prescribed medications Over the counter steroid nose spray
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
## 2020-02-09 NOTE — Telephone Encounter (Signed)
I called Danielle Browning for an update. She went to the acute care on Thursday for a Covid test and reports her lung ausculation sounds were clear.  She woke up yesterday with her neck gland swollen and a low-grade fever.  She continues to cough today, and feels tightness and wheezing. She has nasal congestion. She can't breathe through her nose, but does not have SOB. She is taking the doxycycline and cough medicine.   Plan:  I ordered Prednisone 10 mg #21 day taper and  albuterol inhaler every 6 hours to begin today. But, I advised that she go to a  [facility] to get in- person exam and may need albuterol nebulizer treatment and a different antibiotic treatment for her neck gland swelling and possible sinusitis.

## 2020-02-14 ENCOUNTER — Encounter (INDEPENDENT_AMBULATORY_CARE_PROVIDER_SITE_OTHER): Payer: Self-pay | Admitting: Family Medicine

## 2020-02-14 ENCOUNTER — Telehealth (INDEPENDENT_AMBULATORY_CARE_PROVIDER_SITE_OTHER): Payer: 59 | Admitting: Psychology

## 2020-02-14 ENCOUNTER — Ambulatory Visit (INDEPENDENT_AMBULATORY_CARE_PROVIDER_SITE_OTHER): Payer: Managed Care, Other (non HMO) | Admitting: Family Medicine

## 2020-02-14 ENCOUNTER — Other Ambulatory Visit: Payer: Self-pay

## 2020-02-14 VITALS — BP 124/76 | HR 72 | Temp 98.2°F | Ht 61.0 in | Wt 295.0 lb

## 2020-02-14 DIAGNOSIS — R7303 Prediabetes: Secondary | ICD-10-CM

## 2020-02-14 DIAGNOSIS — Z6841 Body Mass Index (BMI) 40.0 and over, adult: Secondary | ICD-10-CM | POA: Diagnosis not present

## 2020-02-14 DIAGNOSIS — F3289 Other specified depressive episodes: Secondary | ICD-10-CM

## 2020-02-14 DIAGNOSIS — E559 Vitamin D deficiency, unspecified: Secondary | ICD-10-CM

## 2020-02-18 NOTE — Progress Notes (Signed)
Chief Complaint:   OBESITY Danielle Browning is here to discuss her progress with her obesity treatment plan along with follow-up of her obesity related diagnoses. Danielle Browning is keeping a food journal and adhering to recommended goals of 1000-1100 calories and 85+ grams of protein and states she is following her eating plan approximately 85% of the time. Danielle Browning states she is exercising 0 minutes 0 times per week.  Today's visit was #: 3 Starting weight: 307 lbs Starting date: 01/16/2020 Today's weight: 295 lbs Today's date: 02/14/2020 Total lbs lost to date: 12 Total lbs lost since last in-office visit: 12  Interim History: Danielle Browning has had significant familial stress with her grandson having open heart surgery and is now on a ventilator secondary to a viral infection. She is very emotional. She states she is not very consistent with putting food into [employer].  Subjective:   Vitamin D deficiency. Danielle Browning is on OTC Vitamin D 4,000 IU daily. No nausea, vomiting, or muscle weakness, but she endorses fatigue. Last Vitamin D was 18.80 on 11/27/2019.  Prediabetes. Danielle Browning has a diagnosis of prediabetes based on her elevated HgA1c and was informed this puts her at greater risk of developing diabetes. She continues to work on diet and exercise to decrease her risk of diabetes. She denies nausea or hypoglycemia. Danielle Browning is on Trulicity (took 1st shot 4 days ago). She is following the low carb meal plan.  Lab Results  Component Value Date   HGBA1C 5.8 11/27/2019   Lab Results  Component Value Date   INSULIN 24.7 01/16/2020   Assessment/Plan:   Vitamin D deficiency. Low Vitamin D level contributes to fatigue and are associated with obesity, breast, and colon cancer. She agrees to continue to take OTC Vitamin D and will follow-up for routine testing of Vitamin D, at least 2-3 times per year to avoid over-replacement.  Prediabetes. Danielle Browning will continue to work on weight loss, exercise, and decreasing  simple carbohydrates to help decrease the risk of diabetes. She will have follow-up labs in 3 months. Will follow-up on side effects, if any, at her next appointment.  Class 3 severe obesity with serious comorbidity and body mass index (BMI) of 50.0 to 59.9 in adult, unspecified obesity type ([place]).  Danielle Browning is currently in the action stage of change. As such, her goal is to continue with weight loss efforts. She has agreed to keeping a food journal and adhering to recommended goals of 1000-1100 calories and 85+ grams of protein daily.   Exercise goals: No exercise has been prescribed at this time.  Behavioral modification strategies: increasing lean protein intake, meal planning and cooking strategies and keeping healthy foods in the home.  Danielle Browning has agreed to follow-up with our clinic in 2-3 weeks. She was informed of the importance of frequent follow-up visits to maximize her success with intensive lifestyle modifications for her multiple health conditions.   Objective:   Blood pressure 124/76, pulse 72, temperature 98.2 F (36.8 C), temperature source Oral, height 5\' 1"  (1.549 m), weight 295 lb (133.8 kg), last menstrual period 01/16/2020, SpO2 96 %. Body mass index is 55.74 kg/m.  General: Cooperative, alert, well developed, in no acute distress. HEENT: Conjunctivae and lids unremarkable. Cardiovascular: Regular rhythm.  Lungs: Normal work of breathing. Neurologic: No focal deficits.   Lab Results  Component Value Date   CREATININE 0.59 11/20/2019   BUN 15 11/20/2019   NA 141 11/20/2019   K 4.1 11/20/2019   CL 106 11/20/2019  CO2 22 11/20/2019   Lab Results  Component Value Date   ALT 14 11/27/2019   AST 14 11/27/2019   GGT 16 10/31/2017   ALKPHOS 94 11/27/2019   BILITOT 0.5 11/27/2019   Lab Results  Component Value Date   HGBA1C 5.8 11/27/2019   HGBA1C 5.6 01/25/2019   HGBA1C 5.2 06/16/2018   HGBA1C 5.7 (H) 10/29/2015   Lab Results  Component Value Date    INSULIN 24.7 01/16/2020   Lab Results  Component Value Date   TSH 1.29 11/27/2019   Lab Results  Component Value Date   CHOL 193 11/27/2019   HDL 43.30 11/27/2019   LDLCALC 120 (H) 11/27/2019   TRIG 149.0 11/27/2019   CHOLHDL 4 11/27/2019   Lab Results  Component Value Date   WBC 7.4 11/27/2019   HGB 13.5 11/27/2019   HCT 39.7 11/27/2019   MCV 85.8 11/27/2019   PLT 345.0 11/27/2019   Lab Results  Component Value Date   IRON 60 10/31/2017   Attestation Statements:   Reviewed by clinician on day of visit: allergies, medications, problem list, medical history, surgical history, family history, social history, and previous encounter notes.  Time spent on visit including pre-visit chart review and post-visit charting and care was 17 minutes.   I, Michaelene Song, am acting as transcriptionist for Coralie Common, MD   I have reviewed the above documentation for accuracy and completeness, and I agree with the above. - Jinny Blossom, MD

## 2020-02-26 ENCOUNTER — Ambulatory Visit: Payer: Managed Care, Other (non HMO) | Admitting: Family

## 2020-02-28 ENCOUNTER — Telehealth (INDEPENDENT_AMBULATORY_CARE_PROVIDER_SITE_OTHER): Payer: 59 | Admitting: Psychology

## 2020-02-28 ENCOUNTER — Other Ambulatory Visit: Payer: Self-pay

## 2020-03-03 ENCOUNTER — Telehealth (HOSPITAL_COMMUNITY): Payer: Self-pay | Admitting: Family Medicine

## 2020-03-03 NOTE — Telephone Encounter (Signed)
Patient called and cancelled her echocardiogram that you ordered on 03/05/20 and does not wish to schedule. Order will be removed from the WQ.

## 2020-03-05 ENCOUNTER — Other Ambulatory Visit (HOSPITAL_COMMUNITY): Payer: Self-pay

## 2020-03-06 ENCOUNTER — Ambulatory Visit (INDEPENDENT_AMBULATORY_CARE_PROVIDER_SITE_OTHER): Payer: Managed Care, Other (non HMO) | Admitting: Family Medicine

## 2020-03-13 ENCOUNTER — Telehealth (INDEPENDENT_AMBULATORY_CARE_PROVIDER_SITE_OTHER): Payer: Self-pay | Admitting: Psychology

## 2020-03-13 NOTE — Telephone Encounter (Signed)
  Office: [phone]  /  Fax: [phone]  Date of Call: March 13, 2020  Time of Call: 9:16am Provider: Glennie Isle, PsyD  CONTENT: This provider called Delcie Roch to check-in and schedule a follow-up appointment. A HIPAA compliant voicemail was left requesting a call back.   PLAN: This provider will wait for Caryssa to call back. No further follow-up planned by this provider.

## 2020-07-02 ENCOUNTER — Ambulatory Visit: Payer: Managed Care, Other (non HMO) | Admitting: Physician Assistant

## 2020-07-02 ENCOUNTER — Encounter: Payer: Managed Care, Other (non HMO) | Admitting: Physician Assistant

## 2020-07-02 ENCOUNTER — Other Ambulatory Visit: Payer: Self-pay

## 2020-07-02 ENCOUNTER — Other Ambulatory Visit: Payer: Managed Care, Other (non HMO)

## 2020-07-02 VITALS — BP 132/82 | HR 76 | Temp 97.7°F | Resp 16 | Ht 61.0 in | Wt 320.0 lb

## 2020-07-02 DIAGNOSIS — Z008 Encounter for other general examination: Secondary | ICD-10-CM | POA: Diagnosis not present

## 2020-07-02 DIAGNOSIS — R21 Rash and other nonspecific skin eruption: Secondary | ICD-10-CM | POA: Diagnosis not present
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
## 2020-09-02 ENCOUNTER — Ambulatory Visit
Admission: EM | Admit: 2020-09-02 | Discharge: 2020-09-02 | Disposition: A | Discharge status: Home / Self Care | Payer: Managed Care, Other (non HMO) | Attending: Physician Assistant | Admitting: Physician Assistant

## 2020-09-02 ENCOUNTER — Other Ambulatory Visit: Payer: Self-pay

## 2020-09-02 DIAGNOSIS — N390 Urinary tract infection, site not specified: Secondary | ICD-10-CM | POA: Diagnosis present

## 2020-09-02 LAB — URINALYSIS, COMPLETE (UACMP) WITH MICROSCOPIC
Bilirubin Urine: NEGATIVE
Glucose, UA: NEGATIVE mg/dL
Ketones, ur: NEGATIVE mg/dL
Nitrite: NEGATIVE
Protein, ur: 100 mg/dL — AB
Specific Gravity, Urine: 1.02 (ref 1.005–1.030)
WBC, UA: >50 WBC/hpf (ref 0–5)
pH: 6 (ref 5.0–8.0)

## 2020-09-02 MED ORDER — NITROFURANTOIN MONOHYD MACRO 100 MG PO CAPS: 100.0000 mg | ORAL_CAPSULE | Freq: Two times a day (BID) | ORAL | 0 refills | Status: DC

## 2020-09-02 MED ORDER — PHENAZOPYRIDINE HCL 200 MG PO TABS
200.0000 mg | ORAL_TABLET | Freq: Three times a day (TID) | ORAL | 0 refills | Status: DC
Start: 2020-09-02 — End: 2021-04-07

## 2020-09-02 NOTE — ED Triage Notes (Signed)
Pt c/o urinary frequency, urgency and hematuria onset Saturday. Also reports nausea and lower abdominal pain.  Denies fever, v/d, flank/back pain. Has been pushing fluids and took AZO on Sunday.
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
## 2020-09-02 NOTE — Discharge Instructions (Addendum)
Continue to increase your oral fluid intake so that you increase your urine production and flush your urinary system.  Take the Macrobid twice daily for 5 days for the urinary tract infection.  Use the Pyridium every 8 hours as needed for urinary discomfort.  We will send urine for culture and adjust antibiotics if we need to.  If you develop new, or worsening symptoms, follow-up with your primary care provider.

## 2020-09-05 LAB — URINE CULTURE
Culture: >=100000 — AB
Special Requests: NORMAL

## 2020-09-17 ENCOUNTER — Telehealth: Payer: Self-pay

## 2020-09-17 MED ORDER — METRONIDAZOLE 500 MG PO TABS
500.0000 mg | ORAL_TABLET | Freq: Two times a day (BID) | ORAL | 0 refills | Status: DC
Start: 2020-09-17 — End: 2021-04-07

## 2020-09-17 NOTE — Telephone Encounter (Signed)
mychart message sent to patient- script sent  Needs appointment for yearly exam

## 2020-09-17 NOTE — Telephone Encounter (Signed)
Patient called in stating that she is having some vaginal itchiness as well as a strong odor. Patient believes she has BV. Patient states she is unable to come into the office to be seen and would like an Rx called into her pharmacy which is the [facility] in [place] on [address]. Could you please advise?

## 2020-10-08 ENCOUNTER — Encounter: Payer: Managed Care, Other (non HMO) | Admitting: Certified Nurse Midwife

## 2021-01-29 IMAGING — CR DG CHEST 2V
2 series · 2 of 2 positions shown · non-contrast
Comparison: None.

CLINICAL DATA: Cough

EXAM:
CHEST - 2 VIEW

[chest pa]
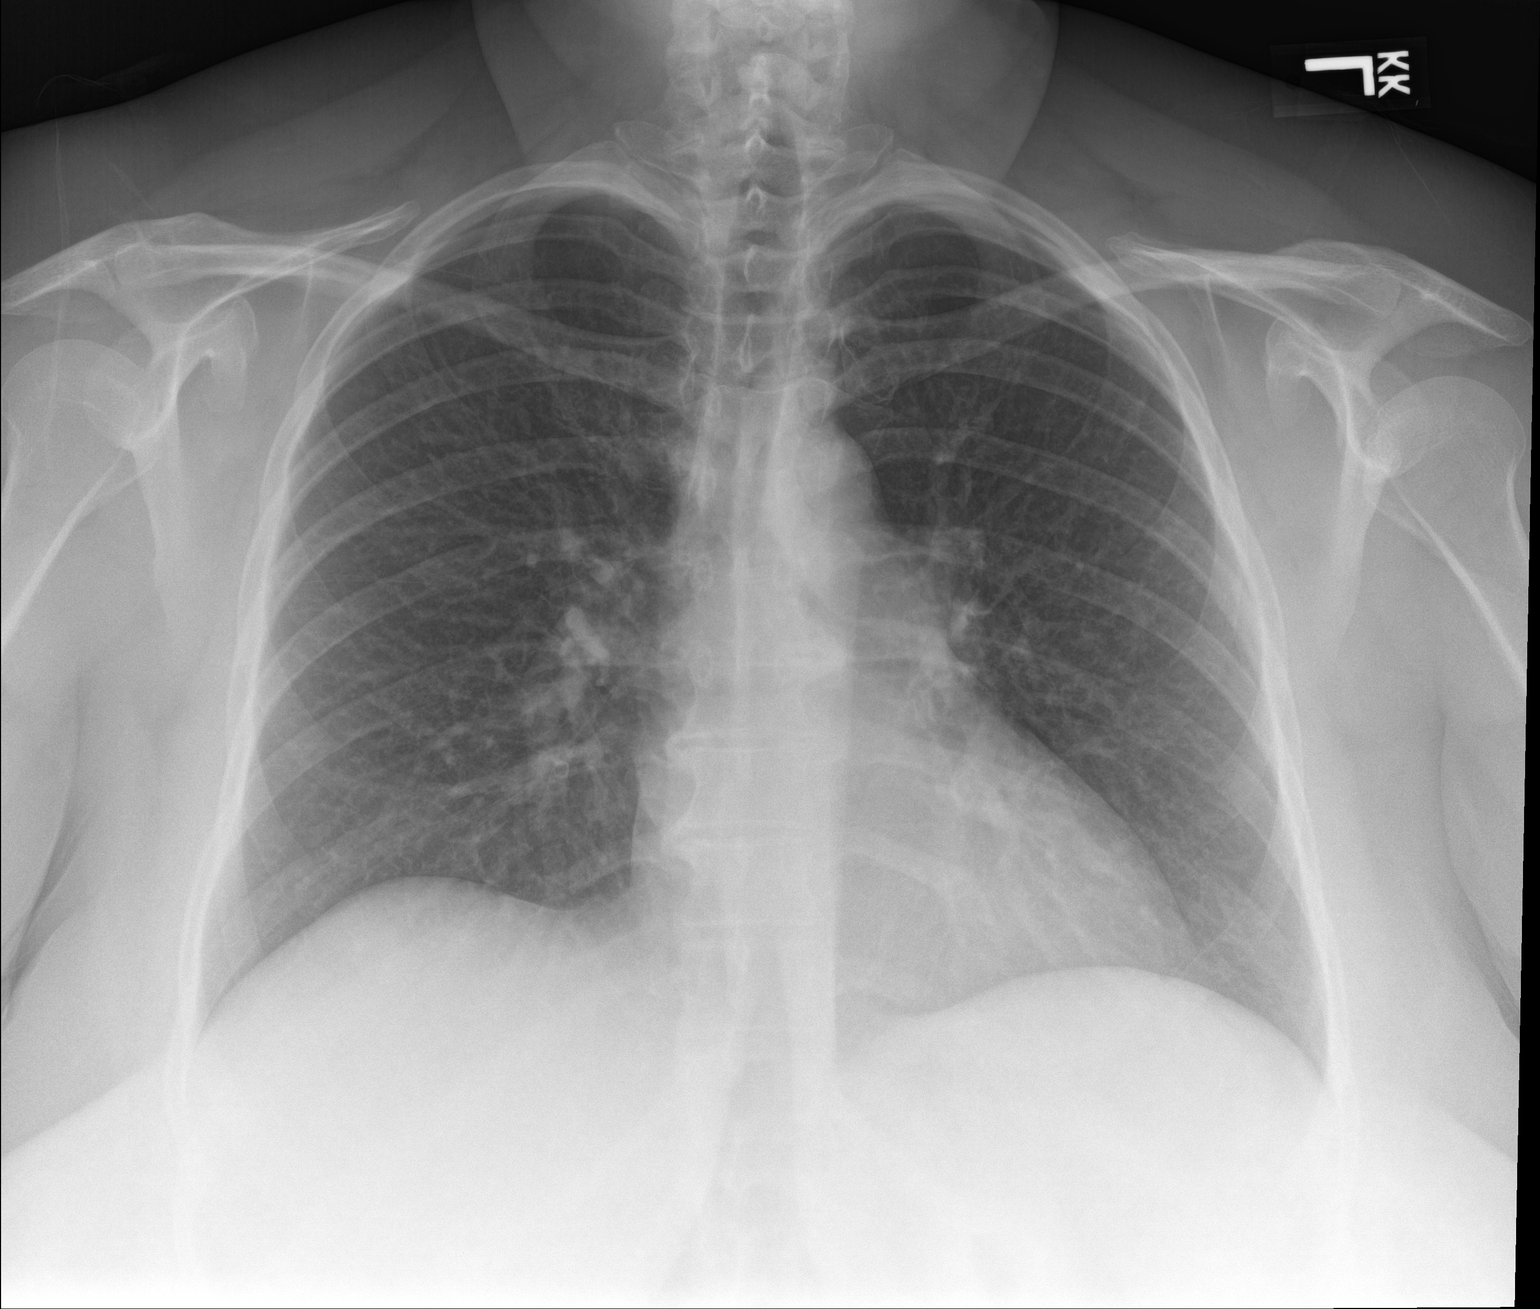

[chest lat]
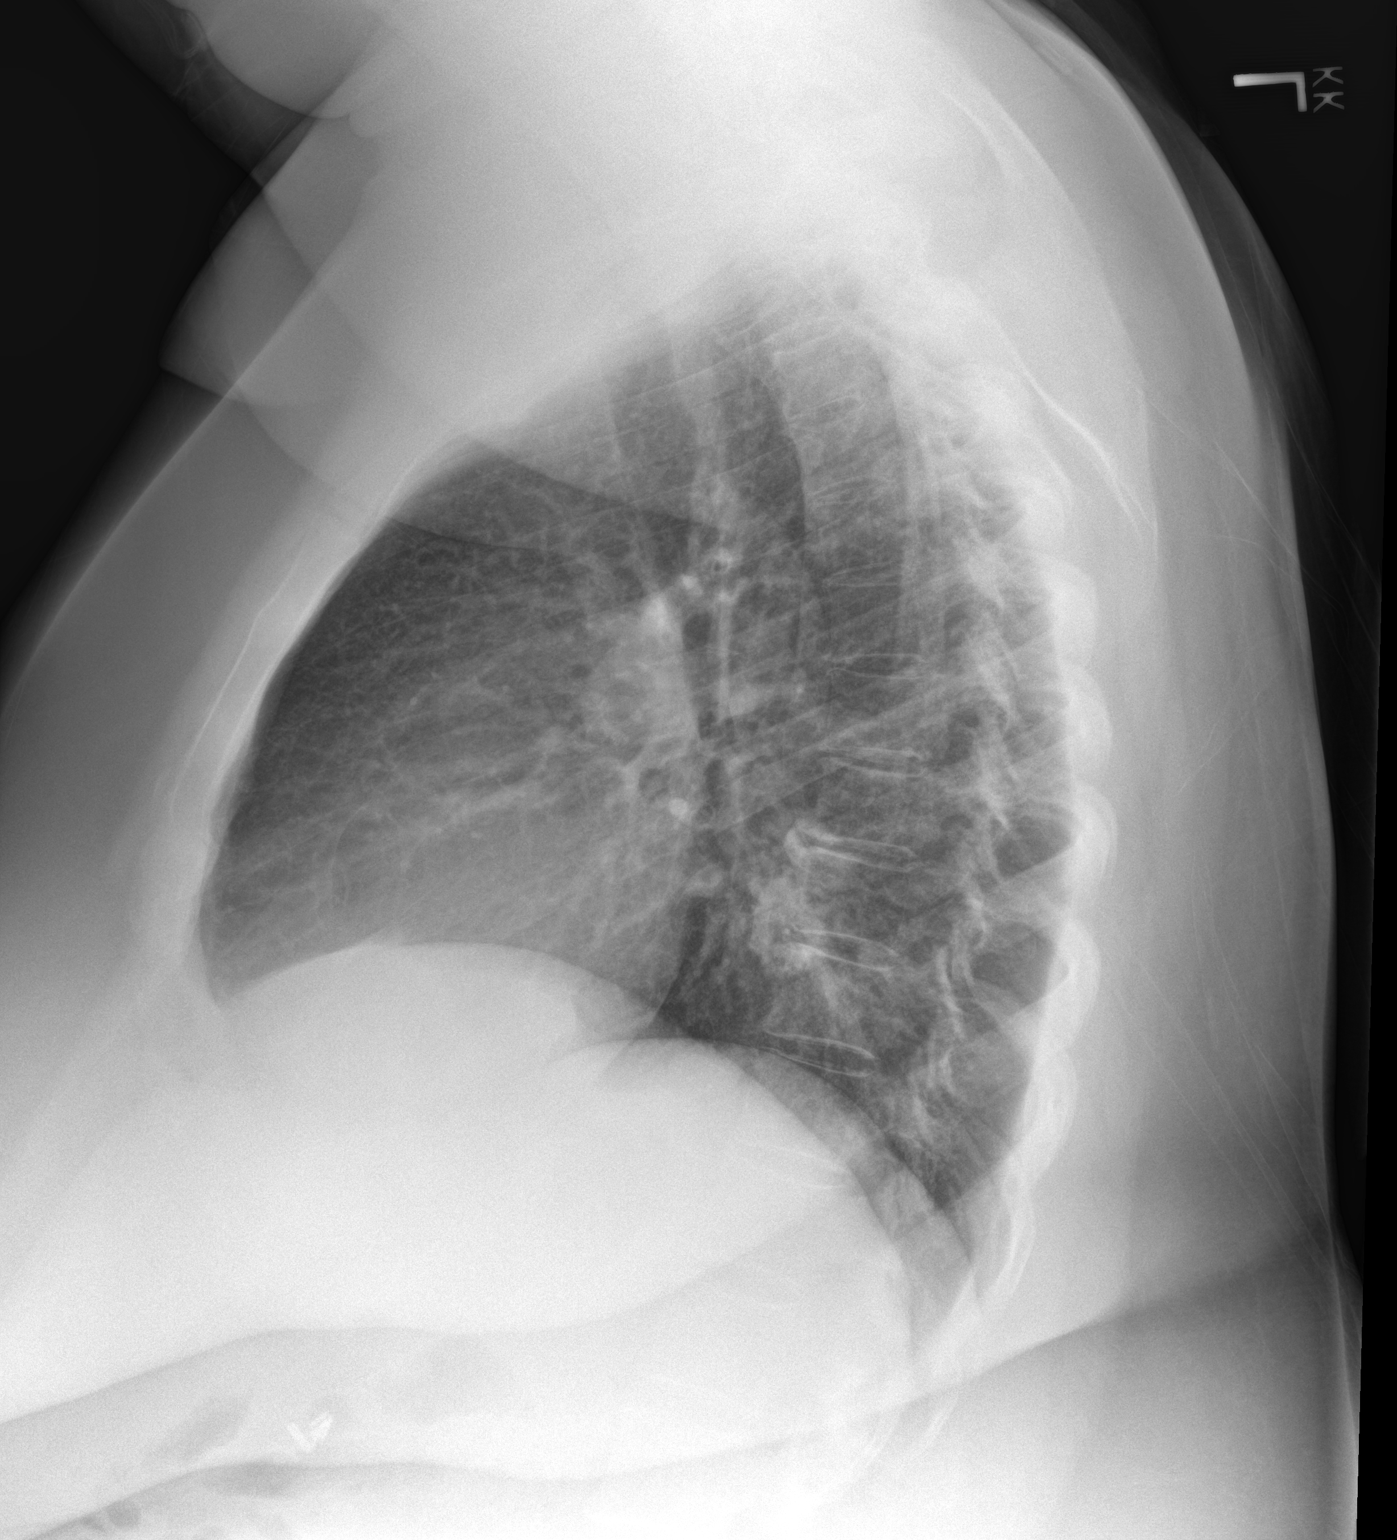

[2 of 2 positions shown; findings below may reference images not displayed]

FINDINGS: The heart size and mediastinal contours are within normal limits.
Both lungs are clear. The visualized skeletal structures are
unremarkable.
IMPRESSION: No active cardiopulmonary disease.

## 2021-03-03 ENCOUNTER — Ambulatory Visit: Payer: Managed Care, Other (non HMO) | Admitting: Nurse Practitioner

## 2021-03-03 VITALS — BP 129/68 | HR 71 | Temp 97.3°F | Resp 20 | Ht 61.0 in | Wt 320.0 lb

## 2021-03-03 DIAGNOSIS — B353 Tinea pedis: Secondary | ICD-10-CM

## 2021-03-03 DIAGNOSIS — R21 Rash and other nonspecific skin eruption: Secondary | ICD-10-CM

## 2021-03-03 MED ORDER — DIPHENHYDRAMINE-ZINC ACETATE 2-0.1 % EX LIQD: CUTANEOUS | 0 refills | Status: DC | PRN

## 2021-03-03 MED ORDER — CETIRIZINE HCL 10 MG PO TABS: 10.0000 mg | ORAL_TABLET | Freq: Every day | ORAL | 2 refills | Status: DC

## 2021-03-03 MED ORDER — LAMISIL AT EX POWD: CUTANEOUS | 0 refills | Status: DC

## 2021-03-03 MED ORDER — FAMOTIDINE 20 MG PO TABS: 20.0000 mg | ORAL_TABLET | Freq: Two times a day (BID) | ORAL | 0 refills | Status: DC

## 2021-03-03 MED ORDER — PREDNISONE 20 MG PO TABS: 20.0000 mg | ORAL_TABLET | Freq: Two times a day (BID) | ORAL | 0 refills | Status: DC
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
## 2021-04-07 ENCOUNTER — Ambulatory Visit (INDEPENDENT_AMBULATORY_CARE_PROVIDER_SITE_OTHER): Payer: Managed Care, Other (non HMO) | Admitting: Family

## 2021-04-07 ENCOUNTER — Ambulatory Visit
Admission: RE | Admit: 2021-04-07 | Discharge: 2021-04-07 | Disposition: A | Discharge status: Home / Self Care | Payer: Managed Care, Other (non HMO) | Source: Ambulatory Visit | Attending: Family | Admitting: Family

## 2021-04-07 ENCOUNTER — Encounter: Payer: Self-pay | Admitting: Family

## 2021-04-07 ENCOUNTER — Other Ambulatory Visit: Payer: Self-pay

## 2021-04-07 VITALS — BP 116/68 | HR 81 | Temp 98.1°F | Ht 61.0 in | Wt 329.2 lb

## 2021-04-07 DIAGNOSIS — G8929 Other chronic pain: Secondary | ICD-10-CM

## 2021-04-07 DIAGNOSIS — M7989 Other specified soft tissue disorders: Secondary | ICD-10-CM

## 2021-04-07 DIAGNOSIS — M545 Low back pain, unspecified: Secondary | ICD-10-CM | POA: Diagnosis not present

## 2021-04-07 DIAGNOSIS — L209 Atopic dermatitis, unspecified: Secondary | ICD-10-CM | POA: Diagnosis not present

## 2021-04-07 DIAGNOSIS — J0100 Acute maxillary sinusitis, unspecified: Secondary | ICD-10-CM

## 2021-04-07 DIAGNOSIS — B353 Tinea pedis: Secondary | ICD-10-CM | POA: Diagnosis not present

## 2021-04-07 MED ORDER — DOXYCYCLINE HYCLATE 100 MG PO TABS: 100.0000 mg | ORAL_TABLET | Freq: Two times a day (BID) | ORAL | 0 refills | Status: DC

## 2021-04-07 MED ORDER — CLOTRIMAZOLE 1 % EX OINT: 1.0000 "application " | TOPICAL_OINTMENT | Freq: Two times a day (BID) | CUTANEOUS | 1 refills | Status: AC

## 2021-04-07 MED ORDER — DULOXETINE HCL 30 MG PO CPEP: 30.0000 mg | ORAL_CAPSULE | Freq: Every day | ORAL | 3 refills | Status: DC

## 2021-04-07 MED ORDER — TRIAMCINOLONE ACETONIDE 0.025 % EX CREA: 1.0000 "application " | TOPICAL_CREAM | Freq: Two times a day (BID) | CUTANEOUS | 2 refills | Status: AC

## 2021-04-07 MED ORDER — MUPIROCIN 2 % EX OINT: 1.0000 "application " | TOPICAL_OINTMENT | Freq: Two times a day (BID) | CUTANEOUS | 0 refills | Status: AC

## 2021-04-07 NOTE — Assessment & Plan Note (Signed)
Symptoms consistent with acute sinusitis.  Will start doxycycline.  In the setting of longstanding vertigo, tinnitus, again recommended to reestablish with ENT for further evaluation for sinus disease, etiology for vertigo (?  BPPV), and review review of abnormal sinus findings on MRI brain. Close follow-up.  May consider meclizine prn once we have addressed underlying acute bacterial infection

## 2021-04-07 NOTE — Assessment & Plan Note (Signed)
Chronic, stable.  We will discuss in more detail at follow-up.  I have advised patient to resume Cymbalta 30 mg.  Refilled
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
## 2021-04-07 NOTE — Assessment & Plan Note (Signed)
Symptoms consistent with atopic dermatitis, likely exacerbated by frequent handwashing and/or washing dishes.  Provided topical corticosteroid and advised her to use gloves when washing dishes.  Close follow-up

## 2021-04-07 NOTE — Patient Instructions (Addendum)
Left foot: Suspect athlete's foot.  I provided a topical antifungal.   Rash on hands:   Topical corticosteroid cream, Kenalog.  He may also mix in a little bit Bactroban which is antibiotic.  Please wear gloves when washing dishes and use gentle soap for handwashing   leg swelling:  Compression stockings  Elevate legs above level of nose   Left-sided sinus pressure, vertigo: Start doxycycline for sinusitis. I have  also referred you back to [facility].  Low back pain, anxiety and depression: Restart cymbalta '30mg'$ 

## 2021-04-07 NOTE — Assessment & Plan Note (Signed)
Symptoms most consistent with tinea pedis.  Trial of topical clotrimazole.   Close follow-up

## 2021-04-07 NOTE — Assessment & Plan Note (Signed)
Symptoms consistent with venous insufficiency.  Advised conservative treatment including compression stockings, low-sodium diet and elevation.  Particularly the setting of dizziness, vertigo hesitant to start hydrochlorothiazide at this time however we may do this at follow-up.  Pending stat ultrasound to ensure no underlying DVT.

## 2021-04-22 ENCOUNTER — Other Ambulatory Visit: Payer: Self-pay

## 2021-04-22 ENCOUNTER — Encounter: Payer: Self-pay | Admitting: Family

## 2021-04-22 ENCOUNTER — Ambulatory Visit: Payer: Managed Care, Other (non HMO) | Admitting: Family

## 2021-04-22 VITALS — BP 118/80 | HR 73 | Temp 98.0°F | Ht 60.98 in | Wt 323.8 lb

## 2021-04-22 DIAGNOSIS — L209 Atopic dermatitis, unspecified: Secondary | ICD-10-CM | POA: Diagnosis not present

## 2021-04-22 DIAGNOSIS — J0100 Acute maxillary sinusitis, unspecified: Secondary | ICD-10-CM | POA: Diagnosis not present

## 2021-04-22 DIAGNOSIS — M545 Low back pain, unspecified: Secondary | ICD-10-CM | POA: Diagnosis not present

## 2021-04-22 DIAGNOSIS — G8929 Other chronic pain: Secondary | ICD-10-CM

## 2021-04-22 MED ORDER — MELOXICAM 7.5 MG PO TABS: 7.5000 mg | ORAL_TABLET | Freq: Every day | ORAL | 1 refills | Status: DC | PRN

## 2021-04-22 MED ORDER — DOXYCYCLINE HYCLATE 100 MG PO TABS: 100.0000 mg | ORAL_TABLET | Freq: Two times a day (BID) | ORAL | 0 refills | Status: AC

## 2021-04-22 MED ORDER — TRULICITY 0.75 MG/0.5ML ~~LOC~~ SOAJ: 0.7500 mg | SUBCUTANEOUS | 1 refills | Status: DC

## 2021-04-22 MED ORDER — DULOXETINE HCL 30 MG PO CPEP: 60.0000 mg | ORAL_CAPSULE | Freq: Every day | ORAL | 3 refills | Status: DC

## 2021-04-22 NOTE — Patient Instructions (Addendum)
Start another week's course of doxycycline.    Ensure to take probiotics while on antibiotics and also for 2 weeks after completion. This can either be by eating yogurt daily or taking a probiotic supplement over the counter such as Culturelle.It is important to re-colonize the gut with good bacteria and also to prevent any diarrheal infections associated with antibiotic use.   Increase Cymbalta to 60 mg taken once per day.  You may take TWO '30mg'$  tablets accomplish this.  Trial of mobic 7.'5mg'$  for back pain as needed. Take with food  A couple of points in regards to meloxicam ( Mobic) -  This medication is not intended for daily , long term use. It is a potent anti inflammatory ( NSAID), and my intention is for you take as needed for moderate to severe pain. If you find yourself using daily, please let me know.   Please takes Mobic ( meloxicam) with FOOD since it is an anti-inflammatory as it can cause a GI bleed or ulcer. If you have a history of GI bleed or ulcer, please do NOT take.  Do no take over the counter aleve, motrin, advil, goody's powder for pain as they are also NSAIDs, and they are  in the same class as Mobic  Lastly, we will need to monitor kidney function while on Mobic, and if we were to see any decline in kidney function in the future, we would have to discontinue this medication.   Start Trulicity. This is a ONCE per week injectable which increases insulin secretion and delays gastric emptying (helping you feel full longer which is a good thing).  You do NOT have to take with food.   Common reaction are GI upset ( nausea) however this tends to get better with time.  As discussed, this Medicare patient carries a black box warning may not take this medication with a personal or family history of medullary thyroid cancer, multiple endocrine  neoplasia ( MEN).   In 4 weeks time we will increase Trulicity to 1.5 mg once a week.  Dulaglutide Injection What is this  medication? DULAGLUTIDE (DOO la GLOO tide) controls blood sugar in people with type 2 diabetes. It is used with lifestyle changes like diet and exercise. It may lower the risk of problems that need treatment in the hospital. These problemsinclude heart attack or stroke. This medicine may be used for other purposes; ask your health care provider orpharmacist if you have questions. COMMON BRAND NAME(S): Trulicity What should I tell my care team before I take this medication? They need to know if you have any of these conditions: endocrine tumors (MEN 2) or if someone in your family had these tumors eye disease, vision problems history of pancreatitis kidney disease liver disease stomach or intestine problems thyroid cancer or if someone in your family had thyroid cancer an unusual or allergic reaction to dulaglutide, other medicines, foods, dyes, or preservatives pregnant or trying to get pregnant breast-feeding How should I use this medication? This medicine is injected under the skin. You will be taught how to prepare and give it. Take it as directed on the prescription label on the same day of each week. Do NOT prime the pen. Keep taking it unless your health care providertells you to stop. If you use this medicine with insulin, you should inject this medicine and the insulin separately. Do not mix them together. Do not give the injections rightnext to each other. Change (rotate) injection sites with each injection. This  drug comes with INSTRUCTIONS FOR USE. Ask your pharmacist for directions on how to use this medicine. Read the information carefully. Talk to yourpharmacist or health care provider if you have questions. It is important that you put your used needles and syringes in a special sharps container. Do not put them in a trash can. If you do not have a sharpscontainer, call your pharmacist or health care provider to get one. A special MedGuide will be given to you by the pharmacist  with eachprescription and refill. Be sure to read this information carefully each time. Talk to your health care provider about the use of this medicine in children.Special care may be needed. Overdosage: If you think you have taken too much of this medicine contact apoison control center or emergency room at once. NOTE: This medicine is only for you. Do not share this medicine with others. What if I miss a dose? If you miss a dose, take it as soon as you can unless it is more than 3 days late. If it is more than 3 days late, skip the missed dose. Take the next doseat the normal time. What may interact with this medication? other medicines for diabetes Many medications may cause changes in blood sugar, these include: alcohol containing beverages antiviral medicines for HIV or AIDS aspirin and aspirin-like drugs certain medicines for blood pressure, heart disease, irregular heart beat chromium diuretics female hormones, such as estrogens or progestins, birth control pills fenofibrate gemfibrozil isoniazid lanreotide female hormones or anabolic steroids MAOIs like Carbex, Eldepryl, Marplan, Nardil, and Parnate medicines for allergies, asthma, cold, or cough medicines for depression, anxiety, or psychotic disturbances medicines for weight loss niacin nicotine NSAIDs, medicines for pain and inflammation, like ibuprofen or naproxen octreotide pasireotide pentamidine phenytoin probenecid quinolone antibiotics such as ciprofloxacin, levofloxacin, ofloxacin some herbal dietary supplements steroid medicines such as prednisone or cortisone sulfamethoxazole; trimethoprim thyroid hormones Some medications can hide the warning symptoms of low blood sugar (hypoglycemia). You may need to monitor your blood sugar more closely if youare taking one of these medications. These include: beta-blockers, often used for high blood pressure or heart problems (examples include atenolol, metoprolol,  propranolol) clonidine guanethidine reserpine This list may not describe all possible interactions. Give your health care provider a list of all the medicines, herbs, non-prescription drugs, or dietary supplements you use. Also tell them if you smoke, drink alcohol, or use illegaldrugs. Some items may interact with your medicine. What should I watch for while using this medication? Visit your health care provider for regular checks on your progress. Check with your health care provider if you have severe diarrhea, nausea, and vomiting, or if you sweat a lot. The loss of too much body fluid may make itdangerous for you to take this medicine. A test called the HbA1C (A1C) will be monitored. This is a simple blood test. It measures your blood sugar control over the last 2 to 3 months. You willreceive this test every 3 to 6 months. Learn how to check your blood sugar. Learn the symptoms of low and high bloodsugar and how to manage them. Always carry a quick-source of sugar with you in case you have symptoms of low blood sugar. Examples include hard sugar candy or glucose tablets. Make sure others know that you can choke if you eat or drink when you develop serious symptoms of low blood sugar, such as seizures or unconsciousness. Get medicalhelp at once. Tell your health care provider if you have high blood sugar.  You might need to change the dose of your medicine. If you are sick or exercising more thanusual, you may need to change the dose of your medicine. Do not skip meals. Ask your health care provider if you should avoid alcohol. Many nonprescription cough and cold products contain sugar or alcohol. Thesecan affect blood sugar. Pens should never be shared. Even if the needle is changed, sharing may resultin passing of viruses like hepatitis or HIV. Wear a medical ID bracelet or chain. Carry a card that describes yourcondition. List the medicines and doses you take on the card. What side effects may I  notice from receiving this medication? Side effects that you should report to your doctor or health care professionalas soon as possible: allergic reactions (skin rash, itching or hives; swelling of the face, lips, or tongue) changes in vision diarrhea that continues or is severe infection (fever, chills, cough, sore throat, pain or trouble passing urine) kidney injury (trouble passing urine or change in the amount of urine) low blood sugar (feeling anxious; confusion; dizziness; increased hunger; unusually weak or tired; increased sweating; shakiness; cold, clammy skin; irritable; headache; blurred vision; fast heartbeat; loss of consciousness) lump or swelling on the neck trouble breathing trouble swallowing unusual stomach upset or pain vomiting Side effects that usually do not require medical attention (report to yourdoctor or health care professional if they continue or are bothersome): lack or loss of appetite nausea pain, redness, or irritation at site where injected This list may not describe all possible side effects. Call your doctor for medical advice about side effects. You may report side effects to FDA at1-800-FDA-1088. Where should I keep my medication? Keep out of the reach of children and pets. Refrigeration (preferred): Store unopened pens in a refrigerator between 2 and 8 degrees C (36 and 46 degrees F). Keep it in the original carton until you are ready to take it. Do not freeze or use if the medicine has been frozen. Protect from light. Get rid of any unused medicine after the expiration date on thelabel. Room Temperature: The pen may be stored at room temperature below 30 degrees C (86 degrees F) for up to a total of 14 days if needed. Protect from light. Avoid exposure to extreme heat. If it is stored at room temperature, throw awayany unused medicine after 14 days or after it expires, whichever is first. To get rid of medicines that are no longer needed or have  expired: Take the medicine to a medicine take-back program. Check with your pharmacy or law enforcement to find a location. If you cannot return the medicine, ask your pharmacist or health care provider how to get rid of this medicine safely. NOTE: This sheet is a summary. It may not cover all possible information. If you have questions about this medicine, talk to your doctor, pharmacist, orhealth care provider.  2022 Elsevier/Gold Standard (2020-06-23 07:35:51)
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
## 2021-04-22 NOTE — Assessment & Plan Note (Addendum)
Long discussion and we agreed obesity complicates treatment of low back pain.  She been on Trulicity in the past and would like to resume this.  Discussed at great length her daughter's history of MEN type I from her father's side of the family.  I advise an ultrasound of her thyroid to be very conservative.  She politely declines this as she states she has been tested specifically for MEN and she is negative for this.  She wants to move forward with starting Trulicity.  I counseled her on black box warning as it relates to medullary thyroid cancer and MEN and how this is contraindicated personal or family history of either.  Explained she still may have a risk of developing MEN.   she verbalized understanding of this would like to continue with starting Trulicity.

## 2021-04-22 NOTE — Assessment & Plan Note (Signed)
Very mild presentation on exam.  Advised to continue for abstain from hand santitizer, frequent dishwashing if she can.  We have placed referral dermatology for further evaluation, treatment

## 2021-04-22 NOTE — Assessment & Plan Note (Signed)
Continue sinus pressure after 5 days of doxycycline.  I have represcribed doxycycline for another 7-day course.  If no improvement at that point, I recommend a CT sinus.   currently working on ENT consult.  Advised probiotics

## 2021-04-22 NOTE — Assessment & Plan Note (Addendum)
Uncontrolled.  Increase Cymbalta to 60 mg.  Advised consult with orthopedics however she politely declines at this time.  Advised against using ibuprofen 800 mg once or twice per day.  I given her meloxicam to trial as needed.  Counseled her on taking this medication with food, risk of PUD, CKD.  She would like to work on weight loss, increase Cymbalta.  Close follow-up

## 2021-05-01 ENCOUNTER — Encounter: Payer: Self-pay | Admitting: Family

## 2021-05-04 ENCOUNTER — Other Ambulatory Visit: Payer: Self-pay | Admitting: Family

## 2021-05-04 DIAGNOSIS — M545 Low back pain, unspecified: Secondary | ICD-10-CM

## 2021-05-04 DIAGNOSIS — G8929 Other chronic pain: Secondary | ICD-10-CM

## 2021-05-04 MED ORDER — DULOXETINE HCL 60 MG PO CPEP: 60.0000 mg | ORAL_CAPSULE | Freq: Every day | ORAL | 1 refills | Status: DC

## 2021-05-05 ENCOUNTER — Ambulatory Visit: Payer: Managed Care, Other (non HMO) | Admitting: Family

## 2021-05-17 ENCOUNTER — Ambulatory Visit
Admission: EM | Admit: 2021-05-17 | Discharge: 2021-05-17 | Disposition: A | Discharge status: Home / Self Care | Payer: Managed Care, Other (non HMO) | Attending: Family Medicine | Admitting: Family Medicine

## 2021-05-17 ENCOUNTER — Ambulatory Visit (INDEPENDENT_AMBULATORY_CARE_PROVIDER_SITE_OTHER): Payer: Managed Care, Other (non HMO)

## 2021-05-17 ENCOUNTER — Other Ambulatory Visit: Payer: Self-pay

## 2021-05-17 DIAGNOSIS — H9201 Otalgia, right ear: Secondary | ICD-10-CM | POA: Insufficient documentation

## 2021-05-17 DIAGNOSIS — Z20822 Contact with and (suspected) exposure to covid-19: Secondary | ICD-10-CM | POA: Diagnosis not present

## 2021-05-17 DIAGNOSIS — J209 Acute bronchitis, unspecified: Secondary | ICD-10-CM

## 2021-05-17 DIAGNOSIS — Z79899 Other long term (current) drug therapy: Secondary | ICD-10-CM | POA: Insufficient documentation

## 2021-05-17 DIAGNOSIS — Z880 Allergy status to penicillin: Secondary | ICD-10-CM | POA: Insufficient documentation

## 2021-05-17 DIAGNOSIS — Z882 Allergy status to sulfonamides status: Secondary | ICD-10-CM | POA: Insufficient documentation

## 2021-05-17 DIAGNOSIS — R059 Cough, unspecified: Secondary | ICD-10-CM

## 2021-05-17 MED ORDER — DOXYCYCLINE HYCLATE 100 MG PO CAPS: 100.0000 mg | ORAL_CAPSULE | Freq: Two times a day (BID) | ORAL | 0 refills | Status: DC

## 2021-05-17 MED ORDER — PROMETHAZINE-DM 6.25-15 MG/5ML PO SYRP: 5.0000 mL | ORAL_SOLUTION | Freq: Four times a day (QID) | ORAL | 0 refills | Status: DC | PRN
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
## 2021-05-17 NOTE — ED Triage Notes (Addendum)
Pt reports cough that started last Tuesday, productive with green sputum, slight nasal drainage age. Right ear pain, weakness, and fatigue. Chest pressure with coughing.   Neg home COVID test (yesterday)  Have tried OTC IBU, allergy meds, and cold/flu meds w/o much relief.

## 2021-05-18 LAB — SARS CORONAVIRUS 2 (TAT 6-24 HRS): SARS Coronavirus 2: NEGATIVE

## 2021-05-25 ENCOUNTER — Encounter: Payer: Self-pay | Admitting: Family

## 2021-05-25 ENCOUNTER — Other Ambulatory Visit: Payer: Self-pay

## 2021-05-25 ENCOUNTER — Other Ambulatory Visit (HOSPITAL_COMMUNITY)
Admission: RE | Admit: 2021-05-25 | Discharge: 2021-05-25 | Disposition: A | Discharge status: Home / Self Care | Payer: Managed Care, Other (non HMO) | Source: Ambulatory Visit | Attending: Family | Admitting: Family

## 2021-05-25 ENCOUNTER — Telehealth: Payer: Self-pay | Admitting: Family

## 2021-05-25 ENCOUNTER — Ambulatory Visit (INDEPENDENT_AMBULATORY_CARE_PROVIDER_SITE_OTHER): Payer: Managed Care, Other (non HMO) | Admitting: Family

## 2021-05-25 VITALS — BP 116/64 | HR 72 | Temp 98.0°F | Ht 61.0 in | Wt 311.4 lb

## 2021-05-25 DIAGNOSIS — Z23 Encounter for immunization: Secondary | ICD-10-CM | POA: Diagnosis not present

## 2021-05-25 DIAGNOSIS — G8929 Other chronic pain: Secondary | ICD-10-CM

## 2021-05-25 DIAGNOSIS — J0100 Acute maxillary sinusitis, unspecified: Secondary | ICD-10-CM

## 2021-05-25 DIAGNOSIS — Z0001 Encounter for general adult medical examination with abnormal findings: Secondary | ICD-10-CM | POA: Diagnosis not present

## 2021-05-25 DIAGNOSIS — Z0000 Encounter for general adult medical examination without abnormal findings: Secondary | ICD-10-CM | POA: Diagnosis not present

## 2021-05-25 DIAGNOSIS — M545 Low back pain, unspecified: Secondary | ICD-10-CM | POA: Diagnosis not present

## 2021-05-25 LAB — CBC WITH DIFFERENTIAL/PLATELET
Basophils Absolute: 0.1 10*3/uL (ref 0.0–0.1)
Basophils Relative: 0.7 % (ref 0.0–3.0)
Eosinophils Absolute: 0.2 10*3/uL (ref 0.0–0.7)
Eosinophils Relative: 2.9 % (ref 0.0–5.0)
HCT: 41.9 % (ref 36.0–46.0)
Hemoglobin: 13.8 g/dL (ref 12.0–15.0)
Lymphocytes Relative: 25.5 % (ref 12.0–46.0)
Lymphs Abs: 1.8 10*3/uL (ref 0.7–4.0)
MCHC: 33 g/dL (ref 30.0–36.0)
MCV: 86.4 fl (ref 78.0–100.0)
Monocytes Absolute: 0.3 10*3/uL (ref 0.1–1.0)
Monocytes Relative: 4.3 % (ref 3.0–12.0)
Neutro Abs: 4.8 10*3/uL (ref 1.4–7.7)
Neutrophils Relative %: 66.6 % (ref 43.0–77.0)
Platelets: 347 10*3/uL (ref 150.0–400.0)
RBC: 4.85 Mil/uL (ref 3.87–5.11)
RDW: 13.8 % (ref 11.5–15.5)
WBC: 7.2 10*3/uL (ref 4.0–10.5)

## 2021-05-25 LAB — LIPID PANEL
Cholesterol: 204 mg/dL — ABNORMAL HIGH (ref 0–200)
HDL: 41.8 mg/dL (ref >39.00)
LDL Cholesterol: 131 mg/dL — ABNORMAL HIGH (ref 0–99)
NonHDL: 162.19
Total CHOL/HDL Ratio: 5
Triglycerides: 158 mg/dL — ABNORMAL HIGH (ref 0.0–149.0)
VLDL: 31.6 mg/dL (ref 0.0–40.0)

## 2021-05-25 LAB — COMPREHENSIVE METABOLIC PANEL
ALT: 16 U/L (ref 0–35)
AST: 15 U/L (ref 0–37)
Albumin: 4 g/dL (ref 3.5–5.2)
Alkaline Phosphatase: 91 U/L (ref 39–117)
BUN: 14 mg/dL (ref 6–23)
CO2: 29 mEq/L (ref 19–32)
Calcium: 9.1 mg/dL (ref 8.4–10.5)
Chloride: 99 mEq/L (ref 96–112)
Creatinine, Ser: 0.64 mg/dL (ref 0.40–1.20)
GFR: 105.2 mL/min (ref >60.00)
Glucose, Bld: 118 mg/dL — ABNORMAL HIGH (ref 70–99)
Potassium: 4.1 mEq/L (ref 3.5–5.1)
Sodium: 137 mEq/L (ref 135–145)
Total Bilirubin: 0.5 mg/dL (ref 0.2–1.2)
Total Protein: 7.4 g/dL (ref 6.0–8.3)

## 2021-05-25 LAB — TSH: TSH: 1.14 u[IU]/mL (ref 0.35–5.50)

## 2021-05-25 LAB — VITAMIN D 25 HYDROXY (VIT D DEFICIENCY, FRACTURES): VITD: 16.61 ng/mL — ABNORMAL LOW (ref 30.00–100.00)

## 2021-05-25 LAB — HEMOGLOBIN A1C: Hgb A1c MFr Bld: 5.9 % (ref 4.6–6.5)

## 2021-05-25 MED ORDER — CYCLOBENZAPRINE HCL 10 MG PO TABS: 5.0000 mg | ORAL_TABLET | Freq: Every evening | ORAL | 1 refills | Status: DC | PRN
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
## 2021-05-25 NOTE — Assessment & Plan Note (Signed)
Discussed again the recurrent nature of sinusitis.  Emphasized the importance of following with ENT for further evaluation.  She verbalized understanding and states she will call to schedule this appointment

## 2021-05-25 NOTE — Patient Instructions (Signed)
Flexeril at night as needed for muscle spasm, low back pain.  Please ensure that you do call [facility] and schedule a follow-up as we talked as I am concerned about the recurrent nature of your sinusitis.  Please call  and schedule your 3D mammogram  as discussed.   [place], [place]   Health Maintenance, Female Adopting a healthy lifestyle and getting preventive care are important in promoting health and wellness. Ask your health care provider about: The right schedule for you to have regular tests and exams. Things you can do on your own to prevent diseases and keep yourself healthy. What should I know about diet, weight, and exercise? Eat a healthy diet  Eat a diet that includes plenty of vegetables, fruits, low-fat dairy products, and lean protein. Do not eat a lot of foods that are high in solid fats, added sugars, or sodium. Maintain a healthy weight Body mass index (BMI) is used to identify weight problems. It estimates body fat based on height and weight. Your health care provider can help determine your BMI and help you achieve or maintain a healthy weight. Get regular exercise Get regular exercise. This is one of the most important things you can do for your health. Most adults should: Exercise for at least 150 minutes each week. The exercise should increase your heart rate and make you sweat (moderate-intensity exercise). Do strengthening exercises at least twice a week. This is in addition to the moderate-intensity exercise. Spend less time sitting. Even light physical activity can be beneficial. Watch cholesterol and blood lipids Have your blood tested for lipids and cholesterol at 47 years of age, then have this test every 5 years. Have your cholesterol levels checked more often if: Your lipid or cholesterol levels are high. You are older than 47 years of age. You are at high risk for heart  disease. What should I know about cancer screening? Depending on your health history and family history, you may need to have cancer screening at various ages. This may include screening for: Breast cancer. Cervical cancer. Colorectal cancer. Skin cancer. Lung cancer. What should I know about heart disease, diabetes, and high blood pressure? Blood pressure and heart disease High blood pressure causes heart disease and increases the risk of stroke. This is more likely to develop in people who have high blood pressure readings, are of African descent, or are overweight. Have your blood pressure checked: Every 3-5 years if you are 35-78 years of age. Every year if you are 29 years old or older. Diabetes Have regular diabetes screenings. This checks your fasting blood sugar level. Have the screening done: Once every three years after age 49 if you are at a normal weight and have a low risk for diabetes. More often and at a younger age if you are overweight or have a high risk for diabetes. What should I know about preventing infection? Hepatitis B If you have a higher risk for hepatitis B, you should be screened for this virus. Talk with your health care provider to find out if you are at risk for hepatitis B infection. Hepatitis C Testing is recommended for: Everyone born from [birth year] through 1967. Anyone with known risk factors for hepatitis C. Sexually transmitted infections (STIs) Get screened for STIs, including gonorrhea and chlamydia, if: You are sexually active and are younger than 47 years of age. You are older than 47 years of age and your  health care provider tells you that you are at risk for this type of infection. Your sexual activity has changed since you were last screened, and you are at increased risk for chlamydia or gonorrhea. Ask your health care provider if you are at risk. Ask your health care provider about whether you are at high risk for HIV. Your health care provider  may recommend a prescription medicine to help prevent HIV infection. If you choose to take medicine to prevent HIV, you should first get tested for HIV. You should then be tested every 3 months for as long as you are taking the medicine. Pregnancy If you are about to stop having your period (premenopausal) and you may become pregnant, seek counseling before you get pregnant. Take 400 to 800 micrograms (mcg) of folic acid every day if you become pregnant. Ask for birth control (contraception) if you want to prevent pregnancy. Osteoporosis and menopause Osteoporosis is a disease in which the bones lose minerals and strength with aging. This can result in bone fractures. If you are 65 years old or older, or if you are at risk for osteoporosis and fractures, ask your health care provider if you should: Be screened for bone loss. Take a calcium or vitamin D supplement to lower your risk of fractures. Be given hormone replacement therapy (HRT) to treat symptoms of menopause. Follow these instructions at home: Lifestyle Do not use any products that contain nicotine or tobacco, such as cigarettes, e-cigarettes, and chewing tobacco. If you need help quitting, ask your health care provider. Do not use street drugs. Do not share needles. Ask your health care provider for help if you need support or information about quitting drugs. Alcohol use Do not drink alcohol if: Your health care provider tells you not to drink. You are pregnant, may be pregnant, or are planning to become pregnant. If you drink alcohol: Limit how much you use to 0-1 drink a day. Limit intake if you are breastfeeding. Be aware of how much alcohol is in your drink. In the [place] one drink equals one 12 oz bottle of beer (355 mL), one 5 oz glass of wine (148 mL), or one 1 oz glass of hard liquor (44 mL). General instructions Schedule regular health, dental, and eye exams. Stay current with your vaccines. Tell your health care  provider if: You often feel depressed. You have ever been abused or do not feel safe at home. Summary Adopting a healthy lifestyle and getting preventive care are important in promoting health and wellness. Follow your health care provider's instructions about healthy diet, exercising, and getting tested or screened for diseases. Follow your health care provider's instructions on monitoring your cholesterol and blood pressure. This information is not intended to replace advice given to you by your health care provider. Make sure you discuss any questions you have with your health care provider. Document Revised: 10/31/2020 Document Reviewed: 08/16/2018 Elsevier Patient Education  2022 Elsevier Inc.  

## 2021-05-25 NOTE — Telephone Encounter (Signed)
Patient is calling in to see if she has to bring in a form to have a biometric screening at her appointment today.She also would like to know if she can have labs before her appointment this morning.Please advise.

## 2021-05-25 NOTE — Assessment & Plan Note (Signed)
Chronic, slight improvement. Continue cymbalta '60mg'$ . She may increase mobic to '15mg'$  prn. Bothersome at night and advised flexeril 5-'10mg'$  qhs prn. At follow up discuss imaging of low back.

## 2021-05-25 NOTE — Assessment & Plan Note (Signed)
Clinical breast exam performed.  Mammogram has been ordered.  Patient will schedule.  She states that her Tdap date in chart is  Inaccurate and she cannot only last time that she has had a she feels certain that is been greater than 10 years.  Tdap provided today.  Difficulty locating cervix on exam today.  Needed a longer speculum. If no endocervical cells are seen on Pap smear, advised patient I will refer her to GYN for Pap smear.

## 2021-05-25 NOTE — Telephone Encounter (Signed)
I spoke with patient prior to her arrival here & she did not bring Biometric screening. She will send through mychart so we cam fill out. I advise any Biometric needed for work/insurance that she can go ahead & bring to an appointment.

## 2021-05-26 LAB — CYTOLOGY - PAP
Adequacy: ABSENT
Comment: NEGATIVE
Diagnosis: NEGATIVE
High risk HPV: NEGATIVE

## 2021-06-02 ENCOUNTER — Telehealth: Payer: Self-pay | Admitting: Family

## 2021-06-02 NOTE — Telephone Encounter (Signed)
Patient is returning your call from earlier.Please call the patient at [phone].

## 2021-06-02 NOTE — Telephone Encounter (Signed)
See result note. I have spoken with patient.

## 2021-06-03 ENCOUNTER — Ambulatory Visit: Payer: Managed Care, Other (non HMO) | Admitting: Family

## 2021-06-09 ENCOUNTER — Telehealth: Payer: Self-pay | Admitting: Family

## 2021-06-09 NOTE — Telephone Encounter (Signed)
Rejection Reason - Patient did not respond" Danielle Browning said on Jun 09, 2021 8:51 AM  "noted" You said on Apr 28, 2021 1:21 PM  "received referral/04/07/2021/patient will call back to pay balance and schedule appt./sle" Danielle Browning said on Apr 10, 2021 4:04 PM  Msg from [facility]

## 2021-06-17 ENCOUNTER — Encounter: Payer: Self-pay | Admitting: Family

## 2021-07-03 ENCOUNTER — Encounter: Payer: Managed Care, Other (non HMO) | Admitting: Family

## 2021-07-07 ENCOUNTER — Telehealth: Payer: Self-pay

## 2021-07-07 NOTE — Telephone Encounter (Signed)
Danielle Browning  P [facility] (supporting Burnard Hawthorne, FNP) 4 minutes ago (5:06 PM)   it was attached on the october 18th reply.    Danielle Browning  P [facility] (supporting Burnard Hawthorne, FNP) 9 minutes ago (5:00 PM)   hey were you able to get that form to my insurance company    Pates, Rande Brunt, CMA  Ellen[place], [place] 7 days ago   I didn't because it needed your signature. I just emailed it to you. Will they take it faxed without you signing?   Judson Roch, CMA    Danielle Browning  P [facility] (supporting Burnard Hawthorne, FNP) 8 days ago   Good afternoon, were you able to send the wellness form over to my insurance?    Pates, Rande Brunt, CMA  Danielle Browning 2 weeks ago   You are so welcome. Sorry we do not ever measure wait here & so many forms we have that on them.   Best,   Judson Roch, CMA     Danielle Browning  P [facility] (supporting Burnard Hawthorne, FNP) 2 weeks ago   yes I have signed it and added my waist measurement. it is attached thank you    Pates, Rande Brunt, CMA  Danielle Browning 2 weeks ago   I have emailed it back to you. Just make sure that you have received.   Judson Roch, CMA     Danielle Browning  P [facility] (supporting Vidal Schwalbe Yvetta Coder, FNP) 2 weeks ago   sorry about that email address is [email or web address]  I guess I thought you were going to attached to [employer].    Pates, Rande Brunt, CMA  Danielle Browning 2 weeks ago   Can do. By fax, email or mail it to you? Just confirm email address if that is your preferred way of my sending the form back.    Judson Roch, CMA     Danielle Browning  P [facility] (supporting Burnard Hawthorne, FNP) 2 weeks ago   send it back to me and I will sign and send it back to you. thank you Stefan Church, Rande Brunt, CMA  Danielle Browning 2 weeks ago   Hi Danielle Browning has signed, but I see you need to sign too. Do you still want me  to fax without a signature or send it to you?   Judson Roch, CMA     Danielle Browning  P [facility] (supporting Burnard Hawthorne, FNP) 2 weeks ago   thank you.  Have a wonderful day.    Pates, Rande Brunt, CMA  Danielle Browning 2 weeks ago   I will fill out & have Danielle Browning sign. I will let you know when we fax to your insurance!   Judson Roch, CMA     Danielle Browning  P [facility] (supporting Burnard Hawthorne, FNP) 2 weeks ago

## 2021-07-08 NOTE — Telephone Encounter (Signed)
I have faxed for patient.

## 2021-07-13 ENCOUNTER — Ambulatory Visit: Payer: Managed Care, Other (non HMO) | Admitting: Family

## 2021-07-13 ENCOUNTER — Other Ambulatory Visit: Payer: Self-pay

## 2021-07-13 ENCOUNTER — Encounter: Payer: Self-pay | Admitting: Family

## 2021-07-13 VITALS — BP 110/70 | HR 65 | Temp 98.0°F | Ht 61.0 in | Wt 295.4 lb

## 2021-07-13 DIAGNOSIS — G8929 Other chronic pain: Secondary | ICD-10-CM

## 2021-07-13 DIAGNOSIS — B353 Tinea pedis: Secondary | ICD-10-CM

## 2021-07-13 DIAGNOSIS — Z124 Encounter for screening for malignant neoplasm of cervix: Secondary | ICD-10-CM | POA: Diagnosis not present

## 2021-07-13 DIAGNOSIS — M545 Low back pain, unspecified: Secondary | ICD-10-CM

## 2021-07-13 MED ORDER — TIRZEPATIDE 2.5 MG/0.5ML ~~LOC~~ SOAJ: 2.5000 mg | SUBCUTANEOUS | 2 refills | Status: DC

## 2021-07-13 MED ORDER — TERBINAFINE HCL 1 % EX CREA: 1.0000 "application " | TOPICAL_CREAM | Freq: Two times a day (BID) | CUTANEOUS | 0 refills | Status: DC

## 2021-07-13 NOTE — Patient Instructions (Signed)
If your weight loss were to stop, and only if it were to stop, then you may start the [facility]  Congratulations about your weight loss.  Excellent job.    Referral to GYN and also orthopedics. Let us know if you dont hear back within a week in regards to an appointment being scheduled.   Start Mounjaro 2.5mg  once per week injected subcutaneously ( [place])  in stomach. Please clean with alcohol swab prior to injection and be sure to rotate site. You may schedule a nurse visit if you would like to first injection.   After 4 weeks, and if tolerated and weight loss has not reached 1-2 lbs per week, please increase to 5mg  once per week [place].    Please read information on medication below and remember black box warning that you may not take if you or a family member is diagnosed with thyroid cancer (medullary thyroid cancer), or multiple endocrine neoplasia.       Tirzepatide Injection Darcel Bayley) What is this medication? TIRZEPATIDE (tir ZEP a tide) treats type 2 diabetes. It works by increasing insulin levels in your body, which decreases your blood sugar (glucose). Changes to diet and exercise are often combined with this medication. This medicine may be used for other purposes; ask your health care provider or pharmacist if you have questions. COMMON BRAND NAME(S): MOUNJARO What should I tell my care team before I take this medication? They need to know if you have any of these conditions: Endocrine tumors (MEN 2) or if someone in your family had these tumors Eye disease, vision problems Gallbladder disease History of pancreatitis Kidney disease Stomach or intestine problems Thyroid cancer or if someone in your family had thyroid cancer An unusual or allergic reaction to tirzepatide, other medications, foods, dyes, or preservatives Pregnant or trying to get pregnant Breast-feeding How should I use this medication? This medication is injected under the skin. You will be taught how to prepare  and give it. It is given once every week (every 7 days). Keep taking it unless your health care provider tells you to stop. If you use this medication with insulin, you should inject this medication and the insulin separately. Do not mix them together. Do not give the injections right next to each other. Change (rotate) injection sites with each injection. This medication comes with INSTRUCTIONS FOR USE. Ask your pharmacist for directions on how to use this medication. Read the information carefully. Talk to your pharmacist or care team if you have questions. It is important that you put your used needles and syringes in a special sharps container. Do not put them in a trash can. If you do not have a sharps container, call your pharmacist or care team to get one. A special MedGuide will be given to you by the pharmacist with each prescription and refill. Be sure to read this information carefully each time. Talk to your care team about the use of this medication in children. Special care may be needed. Overdosage: If you think you have taken too much of this medicine contact a poison control center or emergency room at once. NOTE: This medicine is only for you. Do not share this medicine with others. What if I miss a dose? If you miss a dose, take it as soon as you can unless it is more than 4 days (96 hours) late. If it is more than 4 days late, skip the missed dose. Take the next dose at the normal time. Do  not take 2 doses within 3 days of each other. What may interact with this medication? Alcohol containing beverages Antiviral medications for HIV or AIDS Aspirin and aspirin-like medications Beta-blockers like atenolol, metoprolol, propranolol Certain medications for blood pressure, heart disease, irregular heart beat Chromium Clonidine Diuretics Female hormones, such as estrogens or progestins, birth control pills Fenofibrate Gemfibrozil Guanethidine Isoniazid Lanreotide Female hormones  or anabolic steroids MAOIs like Carbex, Eldepryl, Marplan, Nardil, and Parnate Medications for weight loss Medications for allergies, asthma, cold, or cough Medications for depression, anxiety, or psychotic disturbances Niacin Nicotine NSAIDs, medications for pain and inflammation, like ibuprofen or naproxen Octreotide Other medications for diabetes, like glyburide, glipizide, or glimepiride Pasireotide Pentamidine Phenytoin Probenecid Quinolone antibiotics such as ciprofloxacin, levofloxacin, ofloxacin Reserpine Some herbal dietary supplements Steroid medications such as prednisone or cortisone Sulfamethoxazole; trimethoprim Thyroid hormones Warfarin This list may not describe all possible interactions. Give your health care provider a list of all the medicines, herbs, non-prescription drugs, or dietary supplements you use. Also tell them if you smoke, drink alcohol, or use illegal drugs. Some items may interact with your medicine. What should I watch for while using this medication? Visit your care team for regular checks on your progress. Drink plenty of fluids while taking this medication. Check with your care team if you get an attack of severe diarrhea, nausea, and vomiting. The loss of too much body fluid can make it dangerous for you to take this medication. A test called the HbA1C (A1C) will be monitored. This is a simple blood test. It measures your blood sugar control over the last 2 to 3 months. You will receive this test every 3 to 6 months. Learn how to check your blood sugar. Learn the symptoms of low and high blood sugar and how to manage them. Always carry a quick-source of sugar with you in case you have symptoms of low blood sugar. Examples include hard sugar candy or glucose tablets. Make sure others know that you can choke if you eat or drink when you develop serious symptoms of low blood sugar, such as seizures or unconsciousness. They must get medical help at  once. Tell your care team if you have high blood sugar. You might need to change the dose of your medication. If you are sick or exercising more than usual, you might need to change the dose of your medication. Do not skip meals. Ask your care team if you should avoid alcohol. Many nonprescription cough and cold products contain sugar or alcohol. These can affect blood sugar. Pens should never be shared. Even if the needle is changed, sharing may result in passing of viruses like hepatitis or HIV. Wear a medical ID bracelet or chain, and carry a card that describes your disease and details of your medication and dosage times. Birth control may not work properly while you are taking this medication. If you take birth control pills by mouth, your care team may recommend another type of birth control for 4 weeks after you start this medication and for 4 weeks after each increase in your dose of this medication. Ask your care team which birth control methods you should use. What side effects may I notice from receiving this medication? Side effects that you should report to your care team as soon as possible: Allergic reactions-skin rash, itching, hives, swelling of the face, lips, tongue, or throat Change in vision Dehydration-increased thirst, dry mouth, feeling faint or lightheaded, headache, dark yellow or brown urine Gallbladder problems-severe stomach  pain, nausea, vomiting, fever Kidney injury-decrease in the amount of urine, swelling of the ankles, hands, or feet Pancreatitis-severe stomach pain that spreads to your back or gets worse after eating or when touched, fever, nausea, vomiting Thyroid cancer-new mass or lump in the neck, pain or trouble swallowing, trouble breathing, hoarseness Side effects that usually do not require medical attention (report these to your care team if they continue or are bothersome): Constipation Diarrhea Loss of Appetite Nausea Stomach pain Upset  stomach Vomiting This list may not describe all possible side effects. Call your doctor for medical advice about side effects. You may report side effects to FDA at 1-800-FDA-1088. Where should I keep my medication? Keep out of the reach of children and pets. Refrigeration (preferred): Store unopened pens in a refrigerator between 2 and 8 degrees C (36 and 46 degrees F). Keep it in the original carton until you are ready to take it. Do not freeze or use if the medication has been frozen. Protect from light. Get rid of any unused medication after the expiration date on the label. Room Temperature: The pen may be stored at room temperature below 30 degrees C (86 degrees F) for up to a total of 21 days if needed. Protect from light. Avoid exposure to extreme heat. If it is stored at room temperature, throw away any unused medication after 21 days or after it expires, whichever is first. The pen has glass parts. Handle it carefully. If you drop the pen on a hard surface, do not use it. Use a new pen for your injection. To get rid of medications that are no longer needed or have expired: Take the medication to a medication take-back program. Check with your pharmacy or law enforcement to find a location. If you cannot return the medication, ask your pharmacist or care team how to get rid of this medication safely. NOTE: This sheet is a summary. It may not cover all possible information. If you have questions about this medicine, talk to your doctor, pharmacist, or health care provider.  2022 Elsevier/Gold Standard (2021-01-20 13:57:48)

## 2021-07-13 NOTE — Assessment & Plan Note (Signed)
Patient returns for repeat Pap smear.  Reviewed prior vaginal smear which was negative HPV, malignancy however absent transformation zone.  I was unable to visualize cervix during last exam and I was unfortunately unable to visualize cervix during today's Pap smear.  Referred patient to GYN for further consult,  evaluation and collection of Pap smear

## 2021-07-13 NOTE — Assessment & Plan Note (Signed)
Chronic, unchanged.  We jointly agreed to pursue second opinion for treatment, imaging with orthopedics.  Continue Cymbalta, as needed meloxicam, and Flexeril.  referral has been placed.

## 2021-07-13 NOTE — Assessment & Plan Note (Signed)
Counseled on risks of developing MEN as her daughter has , reportedly from her father's side. Patient verbalized understanding of increased risk of medullary thyroid cancer, MEN from Trulicity ( which she has been on in the past) and mounjaro which she prefers to start today. Start mounjaro and counseled how to take this medication.  Close follow-up

## 2021-07-13 NOTE — Assessment & Plan Note (Signed)
Waxed and waned.  Fail to resolve with clotrimazole.  Trial of terbinafine.  If no resolution, advised patient to see podiatry and/or dermatology.
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
## 2021-07-14 ENCOUNTER — Telehealth: Payer: Self-pay | Admitting: Obstetrics and Gynecology

## 2021-07-14 ENCOUNTER — Encounter: Payer: Self-pay | Admitting: Obstetrics and Gynecology

## 2021-07-14 NOTE — Telephone Encounter (Signed)
error 

## 2021-09-01 ENCOUNTER — Ambulatory Visit (INDEPENDENT_AMBULATORY_CARE_PROVIDER_SITE_OTHER): Payer: Managed Care, Other (non HMO) | Admitting: Certified Nurse Midwife

## 2021-09-01 ENCOUNTER — Other Ambulatory Visit: Payer: Self-pay

## 2021-09-01 DIAGNOSIS — R87616 Satisfactory cervical smear but lacking transformation zone: Secondary | ICD-10-CM

## 2021-09-01 NOTE — Progress Notes (Signed)
Reviewed Pap results, discussed repeat pap not needed.   NO charge visit.   Philip Aspen, CNM

## 2021-09-11 ENCOUNTER — Encounter: Payer: Managed Care, Other (non HMO) | Admitting: Obstetrics and Gynecology

## 2021-09-17 ENCOUNTER — Encounter: Payer: Managed Care, Other (non HMO) | Admitting: Obstetrics and Gynecology

## 2021-09-21 ENCOUNTER — Telehealth: Payer: Self-pay | Admitting: Family

## 2021-09-21 NOTE — Telephone Encounter (Signed)
Rejection Reason - Patient did not respond" Danielle Browning said on Sep 16, 2021 3:59 PM  "letter mailed" Danielle Browning said on Aug 05, 2021 4:31 PM  "No answer, no vm option" Danielle Browning said on Jul 28, 2021 2:30 PM  "No answer, no vm option" Danielle Browning said on Jul 14, 2021 3:24 PM  Msg from [facility] orthopedic surgery

## 2021-10-07 ENCOUNTER — Ambulatory Visit: Payer: Managed Care, Other (non HMO) | Admitting: Dermatology

## 2021-11-04 ENCOUNTER — Other Ambulatory Visit: Payer: Self-pay | Admitting: Family

## 2021-11-04 DIAGNOSIS — G8929 Other chronic pain: Secondary | ICD-10-CM

## 2021-11-04 DIAGNOSIS — M545 Low back pain, unspecified: Secondary | ICD-10-CM

## 2021-11-16 ENCOUNTER — Encounter: Payer: Self-pay | Admitting: Family Medicine

## 2021-11-16 ENCOUNTER — Ambulatory Visit: Payer: Managed Care, Other (non HMO) | Admitting: Family Medicine

## 2021-11-16 ENCOUNTER — Other Ambulatory Visit: Payer: Self-pay

## 2021-11-16 VITALS — BP 110/70 | HR 71 | Temp 98.6°F | Resp 14 | Ht 61.0 in | Wt 264.8 lb

## 2021-11-16 DIAGNOSIS — R350 Frequency of micturition: Secondary | ICD-10-CM

## 2021-11-16 DIAGNOSIS — U071 COVID-19: Secondary | ICD-10-CM | POA: Diagnosis not present

## 2021-11-16 LAB — URINALYSIS, MICROSCOPIC ONLY: RBC / HPF: NONE SEEN (ref 0–?)

## 2021-11-16 LAB — POCT URINALYSIS DIPSTICK
Bilirubin, UA: NEGATIVE
Glucose, UA: NEGATIVE
Ketones, UA: NEGATIVE
Nitrite, UA: NEGATIVE
Protein, UA: NEGATIVE
Spec Grav, UA: <=1.005 — AB (ref 1.010–1.025)
Urobilinogen, UA: 0.2 E.U./dL
pH, UA: 6 (ref 5.0–8.0)

## 2021-11-16 MED ORDER — NITROFURANTOIN MONOHYD MACRO 100 MG PO CAPS: 100.0000 mg | ORAL_CAPSULE | Freq: Two times a day (BID) | ORAL | 0 refills | Status: DC

## 2021-11-16 NOTE — Assessment & Plan Note (Signed)
Patient has improved significantly.  She is no longer on quarantine.  I encouraged her to wear her mask through today.  She will monitor for resolving symptoms.  If they do not fully resolve she will let us know. ?

## 2021-11-16 NOTE — Progress Notes (Signed)
?Tommi Rumps, MD ?Phone: [phone] ? ?Danielle Browning is a 46 y.o. female who presents today for same day visit.  ? ?UTI: onset today. ?Dysuria- mild  ?Frequency- yes   ?Urgency- yes   ?Hematuria- minimal when wiping   ?Fever- no  ?Abd pain- notes minimal suprapubic discomfort   ?Vaginal d/c- no ? ?COVID-19: Patient notes she had onset of symptoms on 12/03/2021.  She has progressively improved and notes some fatigue and minimal cough at this point. ? ? ?Social History  ? ?Tobacco Use  ?Smoking Status Never  ?Smokeless Tobacco Never  ?Tobacco Comments  ? parents smoked in house when she was a child  ? ? ?Current Outpatient Medications on File Prior to Visit  ?Medication Sig Dispense Refill  ? albuterol (VENTOLIN HFA) 108 (90 Base) MCG/ACT inhaler Inhale 2 puffs into the lungs every 6 (six) hours as needed for wheezing or shortness of breath. 6.7 g 0  ? cetirizine (ZYRTEC) 10 MG tablet Take 1 tablet (10 mg total) by mouth daily. 30 tablet 2  ? Cholecalciferol (VITAMIN D3 PO) Take 4,000 Int'l Units by mouth.    ? Clotrimazole 1 % OINT Apply 1 application topically 2 (two) times daily. 56.7 g 1  ? cyclobenzaprine (FLEXERIL) 10 MG tablet Take 0.5-1 tablets (5-10 mg total) by mouth at bedtime as needed for muscle spasms. 30 tablet 1  ? DULoxetine (CYMBALTA) 60 MG capsule TAKE 1 CAPSULE BY MOUTH EVERY DAY 90 capsule 1  ? meloxicam (MOBIC) 7.5 MG tablet Take 1 tablet (7.5 mg total) by mouth daily as needed for pain. 30 tablet 1  ? mupirocin ointment (BACTROBAN) 2 % Apply 1 application topically 2 (two) times daily. To lesions on abdomen. 22 g 0  ? promethazine-dextromethorphan (PROMETHAZINE-DM) 6.25-15 MG/5ML syrup Take 5 mLs by mouth 4 (four) times daily as needed for cough. 118 mL 0  ? Talc-Starch (LAMISIL AT) POWD Apply to affected area of foot twice daily 70 g 0  ? terbinafine (LAMISIL) 1 % cream Apply 1 application topically 2 (two) times daily. 30 g 0  ? tirzepatide (MOUNJARO) 2.5 MG/0.5ML Pen Inject 2.5 mg into  the skin once a week. 2 mL 2  ? triamcinolone (KENALOG) 0.025 % cream Apply 1 application topically 2 (two) times daily. 80 g 2  ? diphenhydrAMINE (BENADRYL) spray Apply topically every 4 (four) hours as needed for itching. (Patient not taking: Reported on 11/16/2021) 60 mL 0  ? famotidine (PEPCID) 20 MG tablet Take 1 tablet (20 mg total) by mouth 2 (two) times daily. (Patient not taking: Reported on 11/16/2021) 30 tablet 0  ? Misc Natural Products (TURMERIC CURCUMIN) CAPS Take 3,000 capsules by mouth. (Patient not taking: Reported on 11/16/2021)    ? [DISCONTINUED] fluticasone (FLONASE) 50 MCG/ACT nasal spray Place 2 sprays into both nostrils daily. 16 g 0  ? ?No current facility-administered medications on file prior to visit.  ? ? ? ?ROS see history of present illness ? ?Objective ? ?Physical Exam ?Vitals:  ? 11/16/21 1350  ?BP: 110/70  ?Pulse: 71  ?Resp: 14  ?Temp: 98.6 ?F (37 ?C)  ?SpO2: 98%  ? ? ?BP Readings from Last 3 Encounters:  ?11/16/21 110/70  ?07/13/21 110/70  ?05/25/21 116/64  ? ?Wt Readings from Last 3 Encounters:  ?11/16/21 264 lb 12.8 oz (120.1 kg)  ?07/13/21 295 lb 6.4 oz (134 kg)  ?05/25/21 (!) 311 lb 6.4 oz (141.3 kg)  ? ? ?Physical Exam ?Constitutional:   ?   General: She is not in  acute distress. ?   Appearance: She is not diaphoretic.  ?Cardiovascular:  ?   Rate and Rhythm: Normal rate and regular rhythm.  ?   Heart sounds: Normal heart sounds.  ?Pulmonary:  ?   Effort: Pulmonary effort is normal.  ?   Breath sounds: Normal breath sounds.  ?Abdominal:  ?   General: Bowel sounds are normal. There is no distension.  ?   Palpations: Abdomen is soft.  ?   Tenderness: There is abdominal tenderness (Slight suprapubic tenderness). There is no guarding.  ?Skin: ?   General: Skin is warm and dry.  ?Neurological:  ?   Mental Status: She is alert.  ? ? ? ?Assessment/Plan: Please see individual problem list. ? ?Problem List Items Addressed This Visit   ? ? COVID-19  ?  Patient has improved significantly.   She is no longer on quarantine.  I encouraged her to wear her mask through today.  She will monitor for resolving symptoms.  If they do not fully resolve she will let us know. ?  ?  ? Relevant Medications  ? nitrofurantoin, macrocrystal-monohydrate, (MACROBID) 100 MG capsule  ? Urine frequency - Primary  ?  This is concerning for UTI.  Discussed empiric treatment with Macrobid.  We will send urine for culture and microscopy as well as urinalysis.  We will contact her with the results. ?  ?  ? Relevant Medications  ? nitrofurantoin, macrocrystal-monohydrate, (MACROBID) 100 MG capsule  ? Other Relevant Orders  ? POCT Urinalysis Dipstick  ? Urine Microscopic  ? Urine Culture  ? ? ? ?Return if symptoms worsen or fail to improve. ? ?This visit occurred during the SARS-CoV-2 public health emergency.  Safety protocols were in place, including screening questions prior to the visit, additional usage of staff PPE, and extensive cleaning of exam room while observing appropriate contact time as indicated for disinfecting solutions.  ? ? ?Tommi Rumps, MD ?[place] ? ?

## 2021-11-16 NOTE — Patient Instructions (Signed)
Nice to see you. ?We will treat you with Macrobid. ?We will contact you with your urine culture results. ?Please wear a mask when you are around others through today based on when your symptoms started.  If your symptoms do not fully resolve with time please let us know. ?

## 2021-11-16 NOTE — Assessment & Plan Note (Signed)
This is concerning for UTI.  Discussed empiric treatment with Macrobid.  We will send urine for culture and microscopy as well as urinalysis.  We will contact her with the results. ?

## 2021-11-17 ENCOUNTER — Telehealth: Payer: Self-pay | Admitting: Family

## 2021-11-17 LAB — URINE CULTURE
MICRO NUMBER:: 13122396
SPECIMEN QUALITY:: ADEQUATE

## 2021-11-17 NOTE — Telephone Encounter (Signed)
I spoke with the patient by phone and she stated that she was on her way to the ER like the provider had recommended for pain and blood in her urine and the pain stopped and the clots got smaller, patient wanted to know if she still should go to the ER.  I spoke with the provider and he stated that if the pain gets severe and the clots get bigger than they are right now she needed to go to the Er, but he was waiting on her urine culture to come back to determine if she needed a further workup and she understood.  Whitney Hillegass,cma  ?

## 2021-11-20 ENCOUNTER — Other Ambulatory Visit: Payer: Self-pay | Admitting: Family Medicine

## 2021-11-20 DIAGNOSIS — R310 Gross hematuria: Secondary | ICD-10-CM

## 2021-12-14 ENCOUNTER — Ambulatory Visit: Payer: Managed Care, Other (non HMO)

## 2021-12-14 ENCOUNTER — Ambulatory Visit: Payer: Managed Care, Other (non HMO) | Admitting: Dermatology

## 2021-12-14 DIAGNOSIS — L720 Epidermal cyst: Secondary | ICD-10-CM

## 2021-12-14 DIAGNOSIS — L308 Other specified dermatitis: Secondary | ICD-10-CM

## 2021-12-14 MED ORDER — MOMETASONE FUROATE 0.1 % EX CREA
1.0000 | TOPICAL_CREAM | Freq: Every day | CUTANEOUS | 3 refills | Status: AC | PRN
Start: 2021-12-14 — End: ?

## 2021-12-14 NOTE — Progress Notes (Signed)
? ?  New Patient Visit ? ?Subjective  ?Danielle Browning is a 47 y.o. female who presents for the following: hx of rash  (Bil hands, L foot, comes and goes, pt txted foot with otc athletes foot cream, itchy, also tried TMC 0.025% didn't help) and check spot (Back, ~15yr, hx of draining). ? ?[facility] ? ?The following portions of the chart were reviewed this encounter and updated as appropriate:  ? Tobacco  Allergies  Meds  Problems  Med Hx  Surg Hx  Fam Hx   ?  ?Review of Systems:  No other skin or systemic complaints except as noted in HPI or Assessment and Plan. ? ?Objective  ?Well appearing patient in no apparent distress; mood and affect are within normal limits. ? ?A focused examination was performed including back, hands, feet. Relevant physical exam findings are noted in the Assessment and Plan. ? ?R upper back ?1.0cm cystic pap ? ?hands and L foot ?Peeling on fingers, pink pathchyness on left foot ? ? ?Assessment & Plan  ?Epidermal cyst ?R upper back ?Irritating, recommend excising ?Benign-appearing. Exam most consistent with an epidermal inclusion cyst. Discussed that a cyst is a benign growth that can grow over time and sometimes get irritated or inflamed. Recommend observation if it is not bothersome. Discussed option of surgical excision to remove it if it is growing, symptomatic, or other changes noted. Please call for new or changing lesions so they can be evaluated. ? ?Other eczema ?hands and L foot ?Atopic dermatitis (eczema) is a chronic, relapsing, pruritic condition that can significantly affect quality of life. It is often associated with allergic rhinitis and/or asthma and can require treatment with topical medications, phototherapy, or in severe cases biologic injectable medication (Dupixent; Adbry) or Oral JAK inhibitors.  ? ?Start Cerave cream qd to hands feet ?Start Mometasone cream qd up to 5d/wk prn flares ?May consider Eucrisa or Opzelura cream if  not improving with Mometasone cream ? ?mometasone (ELOCON) 0.1 % cream - hands and L foot ?Apply 1 application. topically daily as needed (Rash). Qd up to 5 days a week to eczema on hands and left foot prn flares ? ?Return for surgery cyst R upper back, recheck eczema at surgery appt. ? ?I, SOthelia Pulling RMA, am acting as scribe for DSarina Ser MD . ?Documentation: I have reviewed the above documentation for accuracy and completeness, and I agree with the above. ? ?DSarina Ser MD ? ?

## 2021-12-14 NOTE — Patient Instructions (Addendum)
? ?Pre-Operative Instructions ? ?You are scheduled for a surgical procedure at [facility]. We recommend you read the following instructions. If you have any questions or concerns, please call the office at [phone]. ? ?Shower and wash the entire body with soap and water the day of your surgery paying special attention to cleansing at and around the planned surgery site. ? ?Avoid aspirin or aspirin containing products at least fourteen (14) days prior to your surgical procedure and for at least one week (7 Days) after your surgical procedure. If you take aspirin on a regular basis for heart disease or history of stroke or for any other reason, we may recommend you continue taking aspirin but please notify us if you take this on a regular basis. Aspirin can cause more bleeding to occur during surgery as well as prolonged bleeding and bruising after surgery.  ? ?Avoid other nonsteroidal pain medications at least one week prior to surgery and at least one week prior to your surgery. These include medications such as Ibuprofen (Motrin, Advil and Nuprin), Naprosyn, Voltaren, Relafen, etc. If medications are used for therapeutic reasons, please inform us as they can cause increased bleeding or prolonged bleeding during and bruising after surgical procedures.  ? ?Please advise [place] if you are taking any "blood thinner" medications such as Coumadin or Dipyridamole or Plavix or similar medications. These cause increased bleeding and prolonged bleeding during procedures and bruising after surgical procedures. We may have to consider discontinuing these medications briefly prior to and shortly after your surgery if safe to do so.  ? ?Please inform us of all medications you are currently taking. All medications that are taken regularly should be taken the day of surgery as you always do. Nevertheless, we need to be informed of what medications you are taking prior to surgery to know whether they will affect the  procedure or cause any complications.  ? ?Please inform us of any medication allergies. Also inform us of whether you have allergies to Latex or rubber products or whether you have had any adverse reaction to Lidocaine or Epinephrine. ? ?Please inform us of any prosthetic or artificial body parts such as artificial heart valve, joint replacements, etc., or similar condition that might require preoperative antibiotics.  ? ?We recommend avoidance of alcohol at least two weeks prior to surgery and continued avoidance for at least two weeks after surgery.  ? ?We recommend discontinuation of tobacco smoking at least two weeks prior to surgery and continued abstinence for at least two weeks after surgery. ? ?Do not plan strenuous exercise, strenuous work or strenuous lifting for approximately four weeks after your surgery.  ? ?We request if you are unable to make your scheduled surgical appointment, please call us at least a week in advance or as soon as you are aware of a problem so that we can cancel or reschedule the appointment.  ? ?You MAY TAKE TYLENOL (acetaminophen) for pain as it is not a blood thinner.  ? ?PLEASE PLAN TO BE IN TOWN FOR TWO WEEKS FOLLOWING SURGERY, THIS IS IMPORTANT SO YOU CAN BE CHECKED FOR DRESSING CHANGES, SUTURE REMOVAL AND TO MONITOR FOR POSSIBLE COMPLICATIONS.  ? ?If You Need Anything After Your Visit ? ?If you have any questions or concerns for your doctor, please call our main line at [phone] and press option 4 to reach your doctor's medical assistant. If no one answers, please leave a voicemail as directed and we will return your call as soon as  possible. Messages left after 4 pm will be answered the following business day.  ? ?You may also send [place] a message via MyChart. We typically respond to MyChart messages within 1-2 business days. ? ?For prescription refills, please ask your pharmacy to contact our office. Our fax number is [phone]. ? ?If you have an urgent issue when the  clinic is closed that cannot wait until the next business day, you can page your doctor at the number below.   ? ?Please note that while we do our best to be available for urgent issues outside of office hours, we are not available 24/7.  ? ?If you have an urgent issue and are unable to reach [place], you may choose to seek medical care at your doctor's office, retail clinic, urgent care center, or emergency room. ? ?If you have a medical emergency, please immediately call 911 or go to the emergency department. ? ?Pager Numbers ? ?- Dr. Nehemiah Massed: [phone] ? ?- Dr. Laurence Ferrari: [phone] ? ?- Dr. Nicole Kindred: [phone] ? ?In the event of inclement weather, please call our main line at [phone] for an update on the status of any delays or closures. ? ?Dermatology Medication Tips: ?Please keep the boxes that topical medications come in in order to help keep track of the instructions about where and how to use these. Pharmacies typically print the medication instructions only on the boxes and not directly on the medication tubes.  ? ?If your medication is too expensive, please contact our office at [phone] option 4 or send [place] a message through [place].  ? ?We are unable to tell what your co-pay for medications will be in advance as this is different depending on your insurance coverage. However, we may be able to find a substitute medication at lower cost or fill out paperwork to get insurance to cover a needed medication.  ? ?If a prior authorization is required to get your medication covered by your insurance company, please allow [place] 1-2 business days to complete this process. ? ?Drug prices often vary depending on where the prescription is filled and some pharmacies may offer cheaper prices. ? ?The website www.goodrx.com contains coupons for medications through different pharmacies. The prices here do not account for what the cost may be with help from insurance (it may be cheaper with your insurance), but the  website can give you the price if you did not use any insurance.  ?- You can print the associated coupon and take it with your prescription to the pharmacy.  ?- You may also stop by our office during regular business hours and pick up a GoodRx coupon card.  ?- If you need your prescription sent electronically to a different pharmacy, notify our office through [facility] or by phone at [phone] option 4. ? ? ? ? ?Si Usted Necesita Algo Despu?s de Su Visita ? ?Tambi?n puede enviarnos un mensaje a trav?s de MyChart. Por lo general respondemos a los mensajes de MyChart en el transcurso de 1 a 2 d?as h?biles. ? ?Para renovar recetas, por favor pida a su farmacia que se ponga en contacto con nuestra oficina. Nuestro n?mero de fax es el [phone]. ? ?Si tiene un asunto urgente cuando la cl?nica est? cerrada y que no puede esperar hasta el siguiente d?a h?bil, puede llamar/localizar a su doctor(a) al n?mero que aparece a continuaci?n.  ? ?Por favor, tenga en cuenta que aunque hacemos todo lo posible para estar disponibles para asuntos urgentes fuera del horario de [place],  no estamos disponibles las 24 horas del d?a, los 7 d?as de la semana.  ? ?Si tiene un problema urgente y no puede comunicarse con nosotros, puede optar por buscar atenci?n m?dica  en el consultorio de su doctor(a), en una cl?nica privada, en un centro de atenci?n urgente o en una sala de emergencias. ? ?Si tiene Engineer, maintenance (IT) m?dica, por favor llame inmediatamente al 911 o vaya a la sala de emergencias. ? ?N?meros de b?per ? ?- Dr. Nehemiah Massed: [phone] ? ?- Dra. Moye: [phone] ? ?- Dra. Nicole Kindred: [phone] ? ?En caso de inclemencias del tiempo, por favor llame a nuestra l?nea principal al [phone] para una actualizaci?n sobre el estado de cualquier retraso o cierre. ? ?Consejos para la medicaci?n en dermatolog?a: ?Por favor, guarde las cajas en las que vienen los medicamentos de uso t?pico para ayudarle a seguir las  instrucciones sobre d?nde y c?mo usarlos. Las farmacias generalmente imprimen las instrucciones del medicamento s?lo en las cajas y no directamente en los tubos del [place].  ? ?Si su medicamento es [employer], por

## 2021-12-15 ENCOUNTER — Ambulatory Visit
Admission: RE | Admit: 2021-12-15 | Discharge: 2021-12-15 | Disposition: A | Discharge status: Home / Self Care | Payer: Managed Care, Other (non HMO) | Source: Ambulatory Visit | Attending: Family Medicine | Admitting: Family Medicine

## 2021-12-15 ENCOUNTER — Encounter: Payer: Self-pay | Admitting: Dermatology

## 2021-12-15 DIAGNOSIS — R310 Gross hematuria: Secondary | ICD-10-CM | POA: Insufficient documentation

## 2021-12-15 MED ORDER — IOHEXOL 300 MG/ML  SOLN
125.0000 mL | Freq: Once | INTRAMUSCULAR | Status: AC | PRN
  Administered 2021-12-15: 125 mL via INTRAVENOUS

## 2021-12-22 ENCOUNTER — Other Ambulatory Visit: Payer: Self-pay | Admitting: Family Medicine

## 2021-12-22 DIAGNOSIS — R310 Gross hematuria: Secondary | ICD-10-CM

## 2021-12-23 ENCOUNTER — Ambulatory Visit: Payer: Managed Care, Other (non HMO) | Admitting: Internal Medicine

## 2021-12-23 ENCOUNTER — Encounter: Payer: Self-pay | Admitting: Internal Medicine

## 2021-12-23 VITALS — BP 108/74 | HR 84 | Temp 98.1°F | Ht 61.0 in | Wt 261.8 lb

## 2021-12-23 DIAGNOSIS — M5441 Lumbago with sciatica, right side: Secondary | ICD-10-CM

## 2021-12-23 DIAGNOSIS — L309 Dermatitis, unspecified: Secondary | ICD-10-CM | POA: Diagnosis not present

## 2021-12-23 DIAGNOSIS — M5442 Lumbago with sciatica, left side: Secondary | ICD-10-CM

## 2021-12-23 DIAGNOSIS — Z1211 Encounter for screening for malignant neoplasm of colon: Secondary | ICD-10-CM

## 2021-12-23 DIAGNOSIS — G8929 Other chronic pain: Secondary | ICD-10-CM

## 2021-12-23 DIAGNOSIS — Z13818 Encounter for screening for other digestive system disorders: Secondary | ICD-10-CM

## 2021-12-23 DIAGNOSIS — R161 Splenomegaly, not elsewhere classified: Secondary | ICD-10-CM

## 2021-12-23 DIAGNOSIS — M5416 Radiculopathy, lumbar region: Secondary | ICD-10-CM

## 2021-12-23 DIAGNOSIS — M47816 Spondylosis without myelopathy or radiculopathy, lumbar region: Secondary | ICD-10-CM

## 2021-12-23 DIAGNOSIS — R160 Hepatomegaly, not elsewhere classified: Secondary | ICD-10-CM | POA: Diagnosis not present

## 2021-12-23 DIAGNOSIS — R590 Localized enlarged lymph nodes: Secondary | ICD-10-CM

## 2021-12-23 DIAGNOSIS — Z1231 Encounter for screening mammogram for malignant neoplasm of breast: Secondary | ICD-10-CM

## 2021-12-23 DIAGNOSIS — Z113 Encounter for screening for infections with a predominantly sexual mode of transmission: Secondary | ICD-10-CM

## 2021-12-23 LAB — CBC WITH DIFFERENTIAL/PLATELET
Basophils Absolute: 0.1 10*3/uL (ref 0.0–0.1)
Basophils Relative: 0.9 % (ref 0.0–3.0)
Eosinophils Absolute: 0.3 10*3/uL (ref 0.0–0.7)
Eosinophils Relative: 3.8 % (ref 0.0–5.0)
HCT: 41.5 % (ref 36.0–46.0)
Hemoglobin: 14.1 g/dL (ref 12.0–15.0)
Lymphocytes Relative: 24.2 % (ref 12.0–46.0)
Lymphs Abs: 2 10*3/uL (ref 0.7–4.0)
MCHC: 34 g/dL (ref 30.0–36.0)
MCV: 85.6 fl (ref 78.0–100.0)
Monocytes Absolute: 0.3 10*3/uL (ref 0.1–1.0)
Monocytes Relative: 4 % (ref 3.0–12.0)
Neutro Abs: 5.5 10*3/uL (ref 1.4–7.7)
Neutrophils Relative %: 67.1 % (ref 43.0–77.0)
Platelets: 369 10*3/uL (ref 150.0–400.0)
RBC: 4.85 Mil/uL (ref 3.87–5.11)
RDW: 13.5 % (ref 11.5–15.5)
WBC: 8.2 10*3/uL (ref 4.0–10.5)

## 2021-12-23 LAB — COMPREHENSIVE METABOLIC PANEL
ALT: 14 U/L (ref 0–35)
AST: 14 U/L (ref 0–37)
Albumin: 4.1 g/dL (ref 3.5–5.2)
Alkaline Phosphatase: 87 U/L (ref 39–117)
BUN: 12 mg/dL (ref 6–23)
CO2: 27 mEq/L (ref 19–32)
Calcium: 9 mg/dL (ref 8.4–10.5)
Chloride: 102 mEq/L (ref 96–112)
Creatinine, Ser: 0.54 mg/dL (ref 0.40–1.20)
GFR: 109.16 mL/min (ref >60.00)
Glucose, Bld: 101 mg/dL — ABNORMAL HIGH (ref 70–99)
Potassium: 4.2 mEq/L (ref 3.5–5.1)
Sodium: 138 mEq/L (ref 135–145)
Total Bilirubin: 0.4 mg/dL (ref 0.2–1.2)
Total Protein: 7 g/dL (ref 6.0–8.3)

## 2021-12-23 MED ORDER — MELOXICAM 7.5 MG PO TABS: 7.5000 mg | ORAL_TABLET | Freq: Two times a day (BID) | ORAL | 5 refills | Status: DC | PRN

## 2021-12-23 MED ORDER — KETOROLAC TROMETHAMINE 60 MG/2ML IM SOLN
60.0000 mg | Freq: Once | INTRAMUSCULAR | Status: AC
Start: 2021-12-23 — End: 2021-12-23
  Administered 2021-12-23: 60 mg via INTRAMUSCULAR
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
## 2021-12-23 NOTE — Patient Instructions (Addendum)
[facility]  ? ?Dove products  ?Soaps  ? ?Dove white soap bar/unscented  ?Cetaphil or cerave cream* ? ? ?Dr Sharlet Salina  ? ?Phone Fax E-mail Address  ?[phone] Not available [address]  ? [place] [address]  ?   ?Specialties     ?Physical Medicine and Rehabilitation     ? ? ? ?Low Back Sprain or Strain Rehab ?Ask your health care provider which exercises are safe for you. Do exercises exactly as told by your health care provider and adjust them as directed. It is normal to feel mild stretching, pulling, tightness, or discomfort as you do these exercises. Stop right away if you feel sudden pain or your pain gets worse. Do not begin these exercises until told by your health care provider. ?Stretching and range-of-motion exercises ?These exercises warm up your muscles and joints and improve the movement and flexibility of your back. These exercises also help to relieve pain, numbness, and tingling. ?Lumbar rotation ? ?Lie on your back on a firm bed or the floor with your knees bent. ?Straighten your arms out to your sides so each arm forms a 90-degree angle (right angle) with a side of your body. ?Slowly move (rotate) both of your knees to one side of your body until you feel a stretch in your lower back (lumbar). Try not to let your shoulders lift off the floor. ?Hold this position for __________ seconds. ?Tense your abdominal muscles and slowly move your knees back to the starting position. ?Repeat this exercise on the other side of your body. ?Repeat __________ times. Complete this exercise __________ times a day. ?Single knee to chest ? ?Lie on your back on a firm bed or the floor with both legs straight. ?Bend one of your knees. Use your hands to move your knee up toward your chest until you feel a gentle stretch in your lower back and buttock. ?Hold your leg in this position by holding on to the front of your knee. ?Keep your other leg as straight as  possible. ?Hold this position for __________ seconds. ?Slowly return to the starting position. ?Repeat with your other leg. ?Repeat __________ times. Complete this exercise __________ times a day. ?Prone extension on elbows ? ?Lie on your abdomen on a firm bed or the floor (prone position). ?Prop yourself up on your elbows. ?Use your arms to help lift your chest up until you feel a gentle stretch in your abdomen and your lower back. ?This will place some of your body weight on your elbows. If this is uncomfortable, try stacking pillows under your chest. ?Your hips should stay down, against the surface that you are lying on. Keep your hip and back muscles relaxed. ?Hold this position for __________ seconds. ?Slowly relax your upper body and return to the starting position. ?Repeat __________ times. Complete this exercise __________ times a day. ?Strengthening exercises ?These exercises build strength and endurance in your back. Endurance is the ability to use your muscles for a long time, even after they get tired. ?Pelvic tilt ?This exercise strengthens the muscles that lie deep in the abdomen. ?Lie on your back on a firm bed or the floor with your legs extended. ?Bend your knees so they are pointing toward the ceiling and your feet are flat on the floor. ?Tighten your lower abdominal muscles to press your lower back against the floor. This motion will tilt your pelvis so your tailbone points up toward the ceiling instead of pointing  to your feet or the floor. ?To help with this exercise, you may place a small towel under your lower back and try to push your back into the towel. ?Hold this position for __________ seconds. ?Let your muscles relax completely before you repeat this exercise. ?Repeat __________ times. Complete this exercise __________ times a day. ?Alternating arm and leg raises ? ?Get on your hands and knees on a firm surface. If you are on a hard floor, you may want to use padding, such as an exercise  mat, to cushion your knees. ?Line up your arms and legs. Your hands should be directly below your shoulders, and your knees should be directly below your hips. ?Lift your left leg behind you. At the same time, raise your right arm and straighten it in front of you. ?Do not lift your leg higher than your hip. ?Do not lift your arm higher than your shoulder. ?Keep your abdominal and back muscles tight. ?Keep your hips facing the ground. ?Do not arch your back. ?Keep your balance carefully, and do not hold your breath. ?Hold this position for __________ seconds. ?Slowly return to the starting position. ?Repeat with your right leg and your left arm. ?Repeat __________ times. Complete this exercise __________ times a day. ?Abdominal set with straight leg raise ? ?Lie on your back on a firm bed or the floor. ?Bend one of your knees and keep your other leg straight. ?Tense your abdominal muscles and lift your straight leg up, 4-6 inches (10-15 cm) off the ground. ?Keep your abdominal muscles tight and hold this position for __________ seconds. ?Do not hold your breath. ?Do not arch your back. Keep it flat against the ground. ?Keep your abdominal muscles tense as you slowly lower your leg back to the starting position. ?Repeat with your other leg. ?Repeat __________ times. Complete this exercise __________ times a day. ?Single leg lower with bent knees ?Lie on your back on a firm bed or the floor. ?Tense your abdominal muscles and lift your feet off the floor, one foot at a time, so your knees and hips are bent in 90-degree angles (right angles). ?Your knees should be over your hips and your lower legs should be parallel to the floor. ?Keeping your abdominal muscles tense and your knee bent, slowly lower one of your legs so your toe touches the ground. ?Lift your leg back up to return to the starting position. ?Do not hold your breath. ?Do not let your back arch. Keep your back flat against the ground. ?Repeat with your  other leg. ?Repeat __________ times. Complete this exercise __________ times a day. ?Posture and body mechanics ?Good posture and healthy body mechanics can help to relieve stress in your body's tissues and joints. Body mechanics refers to the movements and positions of your body while you do your daily activities. Posture is part of body mechanics. Good posture means: ?Your spine is in its natural S-curve position (neutral). ?Your shoulders are pulled back slightly. ?Your head is not tipped forward (neutral). ?Follow these guidelines to improve your posture and body mechanics in your everyday activities. ?Standing ? ?When standing, keep your spine neutral and your feet about hip-width apart. Keep a slight bend in your knees. Your ears, shoulders, and hips should line up. ?When you do a task in which you stand in one place for a long time, place one foot up on a stable object that is 2-4 inches (5-10 cm) high, such as a footstool. This helps keep your  spine neutral. ?Sitting ? ?When sitting, keep your spine neutral and keep your feet flat on the floor. Use a footrest, if necessary, and keep your thighs parallel to the floor. Avoid rounding your shoulders, and avoid tilting your head forward. ?When working at a desk or a computer, keep your desk at a height where your hands are slightly lower than your elbows. Slide your chair under your desk so you are close enough to maintain good posture. ?When working at a computer, place your monitor at a height where you are looking straight ahead and you do not have to tilt your head forward or downward to look at the screen. ?Resting ?When lying down and resting, avoid positions that are most painful for you. ?If you have pain with activities such as sitting, bending, stooping, or squatting, lie in a position in which your body does not bend very much. For example, avoid curling up on your side with your arms and knees near your chest (fetal position). ?If you have pain with  activities such as standing for a long time or reaching with your arms, lie with your spine in a neutral position and bend your knees slightly. Try the following positions: ?Lying on your side with a pillow b

## 2021-12-24 ENCOUNTER — Ambulatory Visit: Payer: Managed Care, Other (non HMO) | Admitting: Gastroenterology

## 2021-12-24 ENCOUNTER — Other Ambulatory Visit: Payer: Self-pay

## 2021-12-24 ENCOUNTER — Encounter: Payer: Self-pay | Admitting: Gastroenterology

## 2021-12-24 ENCOUNTER — Telehealth: Payer: Self-pay | Admitting: Gastroenterology

## 2021-12-24 VITALS — BP 100/54 | HR 63 | Temp 97.7°F | Ht 61.0 in | Wt 265.0 lb

## 2021-12-24 DIAGNOSIS — K529 Noninfective gastroenteritis and colitis, unspecified: Secondary | ICD-10-CM | POA: Diagnosis not present

## 2021-12-24 DIAGNOSIS — M5416 Radiculopathy, lumbar region: Secondary | ICD-10-CM | POA: Insufficient documentation

## 2021-12-24 DIAGNOSIS — R162 Hepatomegaly with splenomegaly, not elsewhere classified: Secondary | ICD-10-CM | POA: Diagnosis not present

## 2021-12-24 LAB — HEPATITIS C ANTIBODY
Hepatitis C Ab: NONREACTIVE
SIGNAL TO CUT-OFF: 0.06 (ref <1.00)

## 2021-12-24 LAB — HEPATITIS A ANTIBODY, IGM: Hep A IgM: NONREACTIVE

## 2021-12-24 LAB — HEPATITIS B CORE ANTIBODY, TOTAL: Hep B Core Total Ab: NONREACTIVE

## 2021-12-24 LAB — HEPATITIS B SURFACE ANTIBODY, QUANTITATIVE: Hep B S AB Quant (Post): 27 m[IU]/mL (ref >=10)

## 2021-12-24 LAB — HEPATITIS B SURFACE ANTIGEN: Hepatitis B Surface Ag: NONREACTIVE

## 2021-12-24 MED ORDER — NA SULFATE-K SULFATE-MG SULF 17.5-3.13-1.6 GM/177ML PO SOLN: 354.0000 mL | Freq: Once | ORAL | 0 refills | Status: AC

## 2021-12-24 NOTE — Telephone Encounter (Signed)
Called patient and informed patient that we could change her to 01/06/22. Called ENDO and talk to Evelena Peat and change her to 01/06/22 ?
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
## 2021-12-24 NOTE — Telephone Encounter (Signed)
Patient would like to switch her days for her colonoscopy to 01/06/2022. Requesting call back. ?

## 2021-12-25 ENCOUNTER — Ambulatory Visit
Admission: RE | Admit: 2021-12-25 | Discharge: 2021-12-25 | Disposition: A | Discharge status: Home / Self Care | Payer: Managed Care, Other (non HMO) | Source: Ambulatory Visit | Attending: Internal Medicine | Admitting: Internal Medicine

## 2021-12-25 ENCOUNTER — Other Ambulatory Visit: Payer: Self-pay

## 2021-12-25 DIAGNOSIS — R590 Localized enlarged lymph nodes: Secondary | ICD-10-CM | POA: Diagnosis present

## 2021-12-28 ENCOUNTER — Ambulatory Visit: Payer: Managed Care, Other (non HMO) | Admitting: Dermatology

## 2022-01-05 ENCOUNTER — Encounter: Payer: Self-pay | Admitting: Gastroenterology

## 2022-01-06 ENCOUNTER — Ambulatory Visit
Admission: RE | Admit: 2022-01-06 | Discharge: 2022-01-06 | Disposition: A | Discharge status: Home / Self Care | Payer: Managed Care, Other (non HMO) | Attending: Gastroenterology | Admitting: Gastroenterology

## 2022-01-06 ENCOUNTER — Other Ambulatory Visit: Payer: Self-pay

## 2022-01-06 ENCOUNTER — Ambulatory Visit: Payer: Managed Care, Other (non HMO) | Admitting: Certified Registered"

## 2022-01-06 ENCOUNTER — Encounter
Admission: RE | Disposition: A | Discharge status: Home / Self Care | Payer: Self-pay | Source: Home / Self Care | Attending: Gastroenterology

## 2022-01-06 ENCOUNTER — Encounter: Payer: Self-pay | Admitting: Gastroenterology

## 2022-01-06 DIAGNOSIS — E559 Vitamin D deficiency, unspecified: Secondary | ICD-10-CM | POA: Insufficient documentation

## 2022-01-06 DIAGNOSIS — K5730 Diverticulosis of large intestine without perforation or abscess without bleeding: Secondary | ICD-10-CM | POA: Diagnosis not present

## 2022-01-06 DIAGNOSIS — Z800 Family history of malignant neoplasm of digestive organs: Secondary | ICD-10-CM | POA: Diagnosis not present

## 2022-01-06 DIAGNOSIS — K580 Irritable bowel syndrome with diarrhea: Secondary | ICD-10-CM | POA: Diagnosis not present

## 2022-01-06 DIAGNOSIS — Z9049 Acquired absence of other specified parts of digestive tract: Secondary | ICD-10-CM | POA: Diagnosis not present

## 2022-01-06 DIAGNOSIS — F32A Depression, unspecified: Secondary | ICD-10-CM | POA: Insufficient documentation

## 2022-01-06 DIAGNOSIS — K2950 Unspecified chronic gastritis without bleeding: Secondary | ICD-10-CM | POA: Diagnosis not present

## 2022-01-06 DIAGNOSIS — Z6841 Body Mass Index (BMI) 40.0 and over, adult: Secondary | ICD-10-CM | POA: Diagnosis not present

## 2022-01-06 DIAGNOSIS — K2980 Duodenitis without bleeding: Secondary | ICD-10-CM | POA: Diagnosis not present

## 2022-01-06 DIAGNOSIS — K529 Noninfective gastroenteritis and colitis, unspecified: Secondary | ICD-10-CM

## 2022-01-06 DIAGNOSIS — K219 Gastro-esophageal reflux disease without esophagitis: Secondary | ICD-10-CM | POA: Diagnosis not present

## 2022-01-06 DIAGNOSIS — K449 Diaphragmatic hernia without obstruction or gangrene: Secondary | ICD-10-CM | POA: Diagnosis not present

## 2022-01-06 HISTORY — DX: Malignant neoplasm of unspecified site of unspecified female breast: C50.919

## 2022-01-06 HISTORY — PX: ESOPHAGOGASTRODUODENOSCOPY (EGD) WITH PROPOFOL: SHX5813

## 2022-01-06 HISTORY — PX: COLONOSCOPY WITH PROPOFOL: SHX5780

## 2022-01-06 LAB — POCT PREGNANCY, URINE: Preg Test, Ur: NEGATIVE

## 2022-01-06 SURGERY — COLONOSCOPY WITH PROPOFOL
Anesthesia: General

## 2022-01-06 MED ORDER — PROPOFOL 500 MG/50ML IV EMUL
INTRAVENOUS | Status: DC | PRN
  Administered 2022-01-06: 125 ug/kg/min via INTRAVENOUS

## 2022-01-06 MED ORDER — DEXMEDETOMIDINE HCL IN NACL 200 MCG/50ML IV SOLN
INTRAVENOUS | Status: DC | PRN
  Administered 2022-01-06: 12 ug via INTRAVENOUS

## 2022-01-06 MED ORDER — SODIUM CHLORIDE 0.9 % IV SOLN
INTRAVENOUS | Status: DC | PRN
Start: 2022-01-06 — End: 2022-01-06

## 2022-01-06 MED ORDER — PROPOFOL 10 MG/ML IV BOLUS
INTRAVENOUS | Status: DC | PRN
  Administered 2022-01-06: 30 mg via INTRAVENOUS
  Administered 2022-01-06: 20 mg via INTRAVENOUS
  Administered 2022-01-06: 10 mg via INTRAVENOUS
  Administered 2022-01-06: 50 mg via INTRAVENOUS
  Administered 2022-01-06: 20 mg via INTRAVENOUS

## 2022-01-06 MED ORDER — SODIUM CHLORIDE 0.9 % IV SOLN: INTRAVENOUS | Status: DC

## 2022-01-06 MED ORDER — GLYCOPYRROLATE 0.2 MG/ML IJ SOLN
INTRAMUSCULAR | Status: DC | PRN
  Administered 2022-01-06: .1 mg via INTRAVENOUS
  Administered 2022-01-06: .2 mg via INTRAVENOUS

## 2022-01-06 MED ORDER — LIDOCAINE HCL (CARDIAC) PF 100 MG/5ML IV SOSY
PREFILLED_SYRINGE | INTRAVENOUS | Status: DC | PRN
  Administered 2022-01-06: 100 mg via INTRAVENOUS

## 2022-01-06 MED ORDER — EPHEDRINE SULFATE (PRESSORS) 50 MG/ML IJ SOLN
INTRAMUSCULAR | Status: DC | PRN
  Administered 2022-01-06: 5 mg via INTRAVENOUS

## 2022-01-06 MED ORDER — MIDAZOLAM HCL 2 MG/2ML IJ SOLN
INTRAMUSCULAR | Status: DC | PRN
  Administered 2022-01-06: 2 mg via INTRAVENOUS

## 2022-01-06 NOTE — Transfer of Care (Signed)
Immediate Anesthesia Transfer of Care Note ? ?Patient: Danielle Browning ? ?Procedure(s) Performed: COLONOSCOPY WITH PROPOFOL ?ESOPHAGOGASTRODUODENOSCOPY (EGD) WITH PROPOFOL ? ?Patient Location: PACU ? ?Anesthesia Type:General ? ?Level of Consciousness: awake, drowsy and patient cooperative ? ?Airway & Oxygen Therapy: Patient Spontanous Breathing and Patient connected to face mask oxygen ? ?Post-op Assessment: Report given to RN and Post -op Vital signs reviewed and stable ? ?Post vital signs: Reviewed and stable ? ?Last Vitals:  ?Vitals Value Taken Time  ?BP 93/64 01/06/22 1145  ?Temp    ?Pulse 73 01/06/22 1145  ?Resp 19 01/06/22 1145  ?SpO2 100 % 01/06/22 1145  ? ? ?Last Pain:  ?Vitals:  ? 01/06/22 1145  ?TempSrc:   ?PainSc: Asleep  ?   ? ?  ? ?Complications: No notable events documented. ?
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
## 2022-01-06 NOTE — Anesthesia Postprocedure Evaluation (Signed)
Anesthesia Post Note ? ?Patient: Danielle Browning ? ?Procedure(s) Performed: COLONOSCOPY WITH PROPOFOL ?ESOPHAGOGASTRODUODENOSCOPY (EGD) WITH PROPOFOL ? ?Patient location during evaluation: PACU ?Anesthesia Type: General ?Level of consciousness: oriented and awake and alert ?Pain management: satisfactory to patient ?Vital Signs Assessment: post-procedure vital signs reviewed and stable ?Respiratory status: spontaneous breathing and respiratory function stable ?Cardiovascular status: stable ?Anesthetic complications: no ? ? ?No notable events documented. ? ? ?Last Vitals:  ?Vitals:  ? 01/06/22 1205 01/06/22 1215  ?BP: 103/68 110/81  ?Pulse: 74 75  ?Resp: 18 16  ?Temp:    ?SpO2: 100% 100%  ?  ?Last Pain:  ?Vitals:  ? 01/06/22 1215  ?TempSrc:   ?PainSc: 0-No pain  ? ? ?  ?  ?  ?  ?  ?  ? ?VAN STAVEREN,Luiza Carranco ? ? ? ? ?

## 2022-01-06 NOTE — Op Note (Signed)
[facility] ?Gastroenterology ?Patient Name: Danielle Browning ?Procedure Date: 01/06/2022 10:53 AM ?MRN: [id number] ?Account #: [id number] ?Date of Birth: 03-22-1976 ?Admit Type: Outpatient ?Age: 46 ?Room: [facility] ENDO ROOM 4 ?Gender: Female ?Note Status: Finalized ?Instrument Name: Upper Endoscope [id number] ?Procedure:             Upper GI endoscopy ?Indications:           Diarrhea ?Providers:             Toney Reil MD, MD ?Referring MD:          Lyn Records. Arnett (Referring MD) ?Medicines:             General Anesthesia ?Complications:         No immediate complications. Estimated blood loss: None. ?Procedure:             Pre-Anesthesia Assessment: ?                       - Prior to the procedure, a History and Physical was  ?                       performed, and patient medications and allergies were  ?                       reviewed. The patient is competent. The risks and  ?                       benefits of the procedure and the sedation options and  ?                       risks were discussed with the patient. All questions  ?                       were answered and informed consent was obtained.  ?                       Patient identification and proposed procedure were  ?                       verified by the physician, the nurse, the  ?                       anesthesiologist, the anesthetist and the technician  ?                       in the pre-procedure area in the procedure room in the  ?                       endoscopy suite. Mental Status Examination: alert and  ?                       oriented. Airway Examination: normal oropharyngeal  ?                       airway and neck mobility. Respiratory Examination:  ?                       clear to auscultation. CV Examination: normal.  ?  Prophylactic Antibiotics: The patient does not require  ?                       prophylactic antibiotics. Prior Anticoagulants: The  ?                       patient has taken no  previous anticoagulant or  ?                       antiplatelet agents. ASA Grade Assessment: III - A  ?                       patient with severe systemic disease. After reviewing  ?                       the risks and benefits, the patient was deemed in  ?                       satisfactory condition to undergo the procedure. The  ?                       anesthesia plan was to use general anesthesia.  ?                       Immediately prior to administration of medications,  ?                       the patient was re-assessed for adequacy to receive  ?                       sedatives. The heart rate, respiratory rate, oxygen  ?                       saturations, blood pressure, adequacy of pulmonary  ?                       ventilation, and response to care were monitored  ?                       throughout the procedure. The physical status of the  ?                       patient was re-assessed after the procedure. ?                       After obtaining informed consent, the endoscope was  ?                       passed under direct vision. Throughout the procedure,  ?                       the patient's blood pressure, pulse, and oxygen  ?                       saturations were monitored continuously. The Endoscope  ?                       was introduced through the mouth, and advanced to the  ?  second part of duodenum. The upper GI endoscopy was  ?                       accomplished without difficulty. The patient tolerated  ?                       the procedure well. ?Findings: ?     The duodenal bulb and second portion of the duodenum were normal.  ?     Biopsies for histology were taken with a cold forceps for evaluation of  ?     celiac disease. ?     The entire examined stomach was normal. Biopsies were taken with a cold  ?     forceps for Helicobacter pylori testing. ?     The cardia and gastric fundus were normal on retroflexion. ?     Esophagogastric landmarks were identified:  the gastroesophageal junction  ?     was found at 32 cm from the incisors. ?     The gastroesophageal junction and examined esophagus were normal. ?     A small hiatal hernia was present. ?Impression:            - Normal duodenal bulb and second portion of the  ?                       duodenum. Biopsied. ?                       - Normal stomach. Biopsied. ?                       - Esophagogastric landmarks identified. ?                       - Normal gastroesophageal junction and esophagus. ?                       - Small hiatal hernia. ?Recommendation:        - Await pathology results. ?                       - Follow an antireflux regimen. ?                       - Proceed with colonoscopy as scheduled ?                       See colonoscopy report ?Procedure Code(s):     --- Professional --- ?                       [phone], Esophagogastroduodenoscopy, flexible,  ?                       transoral; with biopsy, single or multiple ?Diagnosis Code(s):     --- Professional --- ?                       K44.9, Diaphragmatic hernia without obstruction or  ?                       gangrene ?  R19.7, Diarrhea, unspecified ?CPT copyright 2019 American Medical Association. All rights reserved. ?The codes documented in this report are preliminary and upon coder review may  ?be revised to meet current compliance requirements. ?Dr. Libby Maw ?Burgundy Matuszak Alger Memos MD, MD ?01/06/2022 11:23:30 AM ?This report has been signed electronically. ?Number of Addenda: 0 ?Note Initiated On: 01/06/2022 10:53 AM ?Estimated Blood Loss:  Estimated blood loss: none. ?     [facility] ?

## 2022-01-06 NOTE — Op Note (Signed)
[facility] ?Gastroenterology ?Patient Name: Danielle Browning ?Procedure Date: 01/06/2022 10:45 AM ?MRN: [id number] ?Account #: [id number] ?Date of Birth: Jun 24, 1976 ?Admit Type: Outpatient ?Age: 46 ?Room: [facility] ENDO ROOM 4 ?Gender: Female ?Note Status: Finalized ?Instrument Name: Colonscope [id number] ?Procedure:             Colonoscopy ?Indications:           Last colonoscopy: April 2018, Chronic diarrhea ?Providers:             Toney Reil MD, MD ?Referring MD:          Lyn Records. Arnett (Referring MD) ?Medicines:             General Anesthesia ?Complications:         No immediate complications. Estimated blood loss: None. ?Procedure:             Pre-Anesthesia Assessment: ?                       - Prior to the procedure, a History and Physical was  ?                       performed, and patient medications and allergies were  ?                       reviewed. The patient is competent. The risks and  ?                       benefits of the procedure and the sedation options and  ?                       risks were discussed with the patient. All questions  ?                       were answered and informed consent was obtained.  ?                       Patient identification and proposed procedure were  ?                       verified by the physician, the nurse, the  ?                       anesthesiologist, the anesthetist and the technician  ?                       in the pre-procedure area in the procedure room in the  ?                       endoscopy suite. Mental Status Examination: alert and  ?                       oriented. Airway Examination: normal oropharyngeal  ?                       airway and neck mobility. Respiratory Examination:  ?                       clear to auscultation. CV Examination: normal.  ?  Prophylactic Antibiotics: The patient does not require  ?                       prophylactic antibiotics. Prior Anticoagulants: The  ?                        patient has taken no previous anticoagulant or  ?                       antiplatelet agents. ASA Grade Assessment: III - A  ?                       patient with severe systemic disease. After reviewing  ?                       the risks and benefits, the patient was deemed in  ?                       satisfactory condition to undergo the procedure. The  ?                       anesthesia plan was to use general anesthesia.  ?                       Immediately prior to administration of medications,  ?                       the patient was re-assessed for adequacy to receive  ?                       sedatives. The heart rate, respiratory rate, oxygen  ?                       saturations, blood pressure, adequacy of pulmonary  ?                       ventilation, and response to care were monitored  ?                       throughout the procedure. The physical status of the  ?                       patient was re-assessed after the procedure. ?                       After obtaining informed consent, the colonoscope was  ?                       passed under direct vision. Throughout the procedure,  ?                       the patient's blood pressure, pulse, and oxygen  ?                       saturations were monitored continuously. The  ?                       Colonoscope was introduced through the anus and  ?  advanced to the the cecum, identified by appendiceal  ?                       orifice and ileocecal valve. The colonoscopy was  ?                       performed without difficulty. The patient tolerated  ?                       the procedure well. The quality of the bowel  ?                       preparation was evaluated using the BBPS [facility] Bowel  ?                       Preparation Scale) with scores of: Right Colon = 3,  ?                       Transverse Colon = 3 and Left Colon = 3 (entire mucosa  ?                       seen well with no residual staining, small fragments  ?                        of stool or opaque liquid). The total BBPS score  ?                       equals 9. ?Findings: ?     The perianal and digital rectal examinations were normal. Pertinent  ?     negatives include normal sphincter tone and no palpable rectal lesions. ?     Multiple diverticula were found in the entire colon. ?     Normal mucosa was found in the entire colon. Biopsies were taken with a  ?     cold forceps for histology. ?     The retroflexed view of the distal rectum and anal verge was normal and  ?     showed no anal or rectal abnormalities. ?     The terminal ileum appeared normal. ?Impression:            - Diverticulosis in the entire examined colon. ?                       - Normal mucosa in the entire examined colon. Biopsied. ?                       - The distal rectum and anal verge are normal on  ?                       retroflexion view. ?                       - The examined portion of the ileum was normal. ?Recommendation:        - Discharge patient to home (with escort). ?                       - Resume previous diet today. ?                       -  Continue present medications. ?                       - Await pathology results. ?                       - Repeat colonoscopy in 10 years for screening  ?                       purposes. ?Procedure Code(s):     --- Professional --- ?                       [phone], Colonoscopy, flexible; with biopsy, single or  ?                       multiple ?Diagnosis Code(s):     --- Professional --- ?                       K52.9, Noninfective gastroenteritis and colitis,  ?                       unspecified ?                       K57.30, Diverticulosis of large intestine without  ?                       perforation or abscess without bleeding ?CPT copyright 2019 American Medical Association. All rights reserved. ?The codes documented in this report are preliminary and upon coder review may  ?be revised to meet current compliance requirements. ?Dr. Libby Maw ?Merian Wroe Alger Memos MD, MD ?01/06/2022 11:43:37 AM ?This report has been signed electronically. ?Number of Addenda: 0 ?Note Initiated On: 01/06/2022 10:45 AM ?Scope Withdrawal Time: 0 hours 9 minutes 5 seconds  ?Total Procedure Duration: 0 hours 14 minutes 35 seconds  ?Estimated Blood Loss:  Estimated blood loss: none. ?     [facility] ?
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
## 2022-01-11 LAB — SURGICAL PATHOLOGY

## 2022-01-13 ENCOUNTER — Telehealth: Payer: Self-pay

## 2022-01-13 DIAGNOSIS — K529 Noninfective gastroenteritis and colitis, unspecified: Secondary | ICD-10-CM

## 2022-01-13 NOTE — Telephone Encounter (Signed)
-----   Message from Lin Landsman, MD sent at 01/12/2022 10:55 PM EDT ----- ?Please inform patient that the pathology results from upper endoscopy suggest that she may have celiac disease, recommend celiac disease panel ? ?Thanks ?RV ?

## 2022-01-13 NOTE — Telephone Encounter (Signed)
Patient verbalized understanding of results. She said to send the lab hours to her mychart  ?

## 2022-01-14 ENCOUNTER — Other Ambulatory Visit: Payer: Self-pay

## 2022-01-14 ENCOUNTER — Other Ambulatory Visit: Payer: Self-pay | Admitting: Gastroenterology

## 2022-01-14 DIAGNOSIS — K529 Noninfective gastroenteritis and colitis, unspecified: Secondary | ICD-10-CM

## 2022-01-14 NOTE — Progress Notes (Signed)
Did not get blood work drew on 01/13/2022 when she went to [facility] she states that she was there from 4 to 5 and they could never get her labs to come through. Patient is here today for lab work and blood has been sent for her. Called customer service and they took the message and going to send it to higher up. She said to reorder the lab work and she will not be charge twice to insurance  ?

## 2022-01-17 LAB — CELIAC DISEASE PANEL
Endomysial IgA: NEGATIVE
Endomysial IgA: NEGATIVE
IgA/Immunoglobulin A, Serum: 139 mg/dL (ref 87–352)
IgA/Immunoglobulin A, Serum: 370 mg/dL — ABNORMAL HIGH (ref 87–352)
Transglutaminase IgA: 3 U/mL (ref 0–3)
Transglutaminase IgA: <2 U/mL (ref 0–3)

## 2022-01-18 ENCOUNTER — Telehealth: Payer: Self-pay

## 2022-01-18 DIAGNOSIS — K529 Noninfective gastroenteritis and colitis, unspecified: Secondary | ICD-10-CM

## 2022-01-18 NOTE — Telephone Encounter (Signed)
Patient verbalized understanding of results  

## 2022-01-18 NOTE — Telephone Encounter (Signed)
-----   Message from Lin Landsman, MD sent at 01/18/2022 12:11 AM EDT ----- ?The celiac blood test results came back normal.  I recommend stool studies to rule out infection given inflammation in her duodenum if the patient is agreeable ? ?RV ? ? ?

## 2022-02-16 ENCOUNTER — Encounter: Payer: Self-pay | Admitting: Dermatology

## 2022-02-16 ENCOUNTER — Ambulatory Visit: Payer: Managed Care, Other (non HMO) | Admitting: Dermatology

## 2022-02-16 DIAGNOSIS — L720 Epidermal cyst: Secondary | ICD-10-CM | POA: Diagnosis not present

## 2022-02-16 DIAGNOSIS — D485 Neoplasm of uncertain behavior of skin: Secondary | ICD-10-CM

## 2022-02-16 MED ORDER — MUPIROCIN 2 % EX OINT: 1.0000 "application " | TOPICAL_OINTMENT | Freq: Every day | CUTANEOUS | 0 refills | Status: DC

## 2022-02-16 NOTE — Patient Instructions (Signed)
Wound Care Instructions  On the day following your surgery, you should begin doing daily dressing changes: Remove the old dressing and discard it. Cleanse the wound gently with tap water. This may be done in the shower or by placing a wet gauze pad directly on the wound and letting it soak for several minutes. It is important to gently remove any dried blood from the wound in order to encourage healing. This may be done by gently rolling a moistened Q-tip on the dried blood. Do not pick at the wound. If the wound should start to bleed, continue cleaning the wound, then place a moist gauze pad on the wound and hold pressure for a few minutes.  Make sure you then dry the skin surrounding the wound completely or the tape will not stick to the skin. Do not use cotton balls on the wound. After the wound is clean and dry, apply the ointment gently with a Q-tip. Cut a non-stick pad to fit the size of the wound. Lay the pad flush to the wound. If the wound is draining, you may want to reinforce it with a small amount of gauze on top of the non-stick pad for a little added compression to the area. Use the tape to seal the area completely. Select from the following with respect to your individual situation: If your wound has been stitched closed: continue the above steps 1-8 at least daily until your sutures are removed. If your wound has been left open to heal: continue steps 1-8 at least daily for the first 3-4 weeks. We would like for you to take a few extra precautions for at least the next week. Sleep with your head elevated on pillows if our wound is on your head. Do not bend over or lift heavy items to reduce the chance of elevated blood pressure to the wound Do not participate in particularly strenuous activities.   Below is a list of dressing supplies you might need.  Cotton-tipped applicators - Q-tips Gauze pads (2x2 and/or 4x4) - All-Purpose Sponges Non-stick dressing material - Telfa Tape -  Paper or Hypafix New and clean tube of petroleum jelly - Vaseline    Comments on Post-Operative Period Slight swelling and redness often appear around the wound. This is normal and will disappear within several days following the surgery. The healing wound will drain a brownish-red-yellow discharge during healing. This is a normal phase of wound healing. As the wound begins to heal, the drainage may increase in amount. Again, this drainage is normal. Notify us if the drainage becomes persistently bloody, excessively swollen, or intensely painful or develops a foul odor or red streaks.  If you should experience mild discomfort during the healing phase, you may take an aspirin-free medication such as Tylenol (acetaminophen). Notify us if the discomfort is severe or persistent. Avoid alcoholic beverages when taking pain medicine.  In Case of Wound Hemorrhage A wound hemorrhage is when the bandage suddenly becomes soaked with bright red blood and flows profusely. If this happens, sit down or lie down with your head elevated. If the wound has a dressing on it, do not remove the dressing. Apply pressure to the existing gauze. If the wound is not covered, use a gauze pad to apply pressure and continue applying the pressure for 20 minutes without peeking. DO NOT COVER THE WOUND WITH A LARGE TOWEL OR WASH CLOTH. Release your hand from the wound site but do not remove the dressing. If the bleeding has stopped,   gently clean around the wound. Leave the dressing in place for 24 hours if possible. This wait time allows the blood vessels to close off so that you do not spark a new round of bleeding by disrupting the newly clotted blood vessels with an immediate dressing change. If the bleeding does not subside, continue to hold pressure. If matters are out of your control, contact an After Hours clinic or go to the Emergency Room.   

## 2022-02-16 NOTE — Progress Notes (Signed)
   Follow-Up Visit   Subjective  Danielle Browning is a 46 y.o. female who presents for the following: Cyst (Vs other of right upper back - excise today).  The following portions of the chart were reviewed this encounter and updated as appropriate:   Tobacco  Allergies  Meds  Problems  Med Hx  Surg Hx  Fam Hx     Review of Systems:  No other skin or systemic complaints except as noted in HPI or Assessment and Plan.  Objective  Well appearing patient in no apparent distress; mood and affect are within normal limits.  A focused examination was performed including back. Relevant physical exam findings are noted in the Assessment and Plan.  Right Upper Back 1.2 cm cystic papule   Assessment & Plan  Neoplasm of uncertain behavior of skin Right Upper Back  Skin excision  Lesion length (cm):  1.2 Lesion width (cm):  1.2 Margin per side (cm):  0 Total excision diameter (cm):  1.2 Informed consent: discussed and consent obtained   Timeout: patient name, date of birth, surgical site, and procedure verified   Procedure prep:  Patient was prepped and draped in usual sterile fashion Prep type:  Isopropyl alcohol and povidone-iodine Anesthesia: the lesion was anesthetized in a standard fashion   Anesthetic:  1% lidocaine w/ epinephrine 1-100,000 buffered w/ 8.4% NaHCO3 Instrument used: #15 blade   Hemostasis achieved with: pressure   Hemostasis achieved with comment:  Electrocautery Outcome: patient tolerated procedure well with no complications   Post-procedure details: sterile dressing applied and wound care instructions given   Dressing type: bandage and pressure dressing (mupirocin)    Skin repair Complexity:  Complex Final length (cm):  3 Reason for type of repair: reduce tension to allow closure, reduce the risk of dehiscence, infection, and necrosis, reduce subcutaneous dead space and avoid a hematoma, allow closure of the large defect, preserve normal anatomy, preserve  normal anatomical and functional relationships and enhance both functionality and cosmetic results   Undermining: area extensively undermined   Undermining comment:  Undermining defect 1.2 Subcutaneous layers (deep stitches):  Suture size:  3-0 Suture type: Vicryl (polyglactin 910)   Subcutaneous suture technique: inverted dermal. Fine/surface layer approximation (top stitches):  Suture size:  3-0 Suture type: nylon   Stitches: simple running   Suture removal (days):  7 Hemostasis achieved with: suture and pressure Outcome: patient tolerated procedure well with no complications   Post-procedure details: sterile dressing applied and wound care instructions given   Dressing type: bandage and pressure dressing (mupirocin)    mupirocin ointment (BACTROBAN) 2 % Apply 1 application  topically daily. With dressing changes  Specimen 1 - Surgical pathology Differential Diagnosis: Cyst vs other Check Margins: No   Return in about 1 week (around 02/23/2022) for suture removal.  I, Ashok Cordia, CMA, am acting as scribe for Sarina Ser, MD . Documentation: I have reviewed the above documentation for accuracy and completeness, and I agree with the above.  Sarina Ser, MD

## 2022-02-17 ENCOUNTER — Telehealth: Payer: Self-pay

## 2022-02-17 NOTE — Telephone Encounter (Signed)
Spoke with patient regarding surgery. She is doing fine/hd 

## 2022-02-23 ENCOUNTER — Ambulatory Visit: Payer: Managed Care, Other (non HMO) | Admitting: Dermatology

## 2022-02-23 ENCOUNTER — Ambulatory Visit (INDEPENDENT_AMBULATORY_CARE_PROVIDER_SITE_OTHER): Payer: Managed Care, Other (non HMO) | Admitting: Dermatology

## 2022-02-23 ENCOUNTER — Other Ambulatory Visit: Payer: Self-pay

## 2022-02-23 DIAGNOSIS — D485 Neoplasm of uncertain behavior of skin: Secondary | ICD-10-CM

## 2022-02-23 DIAGNOSIS — L2081 Atopic neurodermatitis: Secondary | ICD-10-CM

## 2022-02-23 DIAGNOSIS — D492 Neoplasm of unspecified behavior of bone, soft tissue, and skin: Secondary | ICD-10-CM

## 2022-02-23 DIAGNOSIS — L720 Epidermal cyst: Secondary | ICD-10-CM

## 2022-02-23 MED ORDER — OPZELURA 1.5 % EX CREA
TOPICAL_CREAM | CUTANEOUS | 2 refills | Status: AC
Start: 2022-02-23 — End: ?

## 2022-02-23 MED ORDER — OPZELURA 1.5 % EX CREA: TOPICAL_CREAM | CUTANEOUS | 2 refills | Status: DC

## 2022-02-23 NOTE — Patient Instructions (Signed)
Due to recent changes in healthcare laws, you may see results of your pathology and/or laboratory studies on MyChart before the doctors have had a chance to review them. We understand that in some cases there may be results that are confusing or concerning to you. Please understand that not all results are received at the same time and often the doctors may need to interpret multiple results in order to provide you with the best plan of care or course of treatment. Therefore, we ask that you please give us 2 business days to thoroughly review all your results before contacting the office for clarification. Should we see a critical lab result, you will be contacted sooner.   If You Need Anything After Your Visit  If you have any questions or concerns for your doctor, please call our main line at [phone] and press option 4 to reach your doctor's medical assistant. If no one answers, please leave a voicemail as directed and we will return your call as soon as possible. Messages left after 4 pm will be answered the following business day.   You may also send us a message via MyChart. We typically respond to MyChart messages within 1-2 business days.  For prescription refills, please ask your pharmacy to contact our office. Our fax number is [phone].  If you have an urgent issue when the clinic is closed that cannot wait until the next business day, you can page your doctor at the number below.    Please note that while we do our best to be available for urgent issues outside of office hours, we are not available 24/7.   If you have an urgent issue and are unable to reach us, you may choose to seek medical care at your doctor's office, retail clinic, urgent care center, or emergency room.  If you have a medical emergency, please immediately call 911 or go to the emergency department.  Pager Numbers  - Dr. Kowalski: [phone]  - Dr. Moye: [phone]  - Dr. Stewart:  [phone]  In the event of inclement weather, please call our main line at [phone] for an update on the status of any delays or closures.  Dermatology Medication Tips: Please keep the boxes that topical medications come in in order to help keep track of the instructions about where and how to use these. Pharmacies typically print the medication instructions only on the boxes and not directly on the medication tubes.   If your medication is too expensive, please contact our office at [phone] option 4 or send us a message through MyChart.   We are unable to tell what your co-pay for medications will be in advance as this is different depending on your insurance coverage. However, we may be able to find a substitute medication at lower cost or fill out paperwork to get insurance to cover a needed medication.   If a prior authorization is required to get your medication covered by your insurance company, please allow us 1-2 business days to complete this process.  Drug prices often vary depending on where the prescription is filled and some pharmacies may offer cheaper prices.  The website www.goodrx.com contains coupons for medications through different pharmacies. The prices here do not account for what the cost may be with help from insurance (it may be cheaper with your insurance), but the website can give you the price if you did not use any insurance.  - You can print the associated coupon and take it with   your prescription to the pharmacy.  - You may also stop by our office during regular business hours and pick up a GoodRx coupon card.  - If you need your prescription sent electronically to a different pharmacy, notify our office through [facility] MyChart or by phone at [phone] option 4.     Si Usted Necesita Algo Despus de Su Visita  Tambin puede enviarnos un mensaje a travs de MyChart. Por lo general respondemos a los mensajes de MyChart en el transcurso de 1 a 2  das hbiles.  Para renovar recetas, por favor pida a su farmacia que se ponga en contacto con nuestra oficina. Nuestro nmero de fax es el [phone].  Si tiene un asunto urgente cuando la clnica est cerrada y que no puede esperar hasta el siguiente da hbil, puede llamar/localizar a su doctor(a) al nmero que aparece a continuacin.   Por favor, tenga en cuenta que aunque hacemos todo lo posible para estar disponibles para asuntos urgentes fuera del horario de oficina, no estamos disponibles las 24 horas del da, los 7 das de la semana.   Si tiene un problema urgente y no puede comunicarse con nosotros, puede optar por buscar atencin mdica  en el consultorio de su doctor(a), en una clnica privada, en un centro de atencin urgente o en una sala de emergencias.  Si tiene una emergencia mdica, por favor llame inmediatamente al 911 o vaya a la sala de emergencias.  Nmeros de bper  - Dr. Kowalski: [phone]  - Dra. Moye: [phone]  - Dra. Stewart: [phone]  En caso de inclemencias del tiempo, por favor llame a nuestra lnea principal al [phone] para una actualizacin sobre el estado de cualquier retraso o cierre.  Consejos para la medicacin en dermatologa: Por favor, guarde las cajas en las que vienen los medicamentos de uso tpico para ayudarle a seguir las instrucciones sobre dnde y cmo usarlos. Las farmacias generalmente imprimen las instrucciones del medicamento slo en las cajas y no directamente en los tubos del medicamento.   Si su medicamento es muy caro, por favor, pngase en contacto con nuestra oficina llamando al [phone] y presione la opcin 4 o envenos un mensaje a travs de MyChart.   No podemos decirle cul ser su copago por los medicamentos por adelantado ya que esto es diferente dependiendo de la cobertura de su seguro. Sin embargo, es posible que podamos encontrar un medicamento sustituto a menor costo o llenar un formulario para que el  seguro cubra el medicamento que se considera necesario.   Si se requiere una autorizacin previa para que su compaa de seguros cubra su medicamento, por favor permtanos de 1 a 2 das hbiles para completar este proceso.  Los precios de los medicamentos varan con frecuencia dependiendo del lugar de dnde se surte la receta y alguna farmacias pueden ofrecer precios ms baratos.  El sitio web www.goodrx.com tiene cupones para medicamentos de diferentes farmacias. Los precios aqu no tienen en cuenta lo que podra costar con la ayuda del seguro (puede ser ms barato con su seguro), pero el sitio web puede darle el precio si no utiliz ningn seguro.  - Puede imprimir el cupn correspondiente y llevarlo con su receta a la farmacia.  - Tambin puede pasar por nuestra oficina durante el horario de atencin regular y recoger una tarjeta de cupones de GoodRx.  - Si necesita que su receta se enve electrnicamente a una farmacia diferente, informe a nuestra oficina a travs de MyChart de [facility]   o por telfono llamando al [phone] y presione la opcin 4.  

## 2022-02-23 NOTE — Progress Notes (Signed)
Pharmacy change for Opzelura Rx

## 2022-02-23 NOTE — Progress Notes (Signed)
   Follow-Up Visit   Subjective  Danielle Browning is a 46 y.o. female who presents for the following: Cyst bx proven (R upper back, pt presents for suture removal) and Eczema (Hands, mometasone cr bid, not helping). Also with knot on wrist.  The following portions of the chart were reviewed this encounter and updated as appropriate:   Tobacco  Allergies  Meds  Problems  Med Hx  Surg Hx  Fam Hx     Review of Systems:  No other skin or systemic complaints except as noted in HPI or Assessment and Plan.  Objective  Well appearing patient in no apparent distress; mood and affect are within normal limits.  A focused examination was performed including back. Relevant physical exam findings are noted in the Assessment and Plan.  Right Upper Back Healing excision site  hands Lichenification and minimal skin           right wrist Firm nodule    Assessment & Plan  Epidermal cyst - S/P excision Right Upper Back  Bx proven  Encounter for Removal of Sutures - Incision site at the R upper back is clean, dry and intact - Wound cleansed, sutures removed, wound cleansed and steri strips applied.  - Discussed pathology results showing epidermal cyst  - Patient advised to keep steri-strips dry until they fall off. - Scars remodel for a full year. - Once steri-strips fall off, patient can apply over-the-counter silicone scar cream each night to help with scar remodeling if desired. - Patient advised to call with any concerns or if they notice any new or changing lesions.   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 Condition is symptomatic/ bothersome to patient. Not currently at goal.   Atopic dermatitis (eczema) is a chronic, relapsing, pruritic condition that can significantly affect quality of life. It is often associated with allergic rhinitis  and/or asthma and can require treatment with topical medications, phototherapy, or in severe cases biologic injectable medication (Dupixent; Adbry) or Oral JAK inhibitors.   D/C Mometasone cream  Start Opzelura cream apply to affected skin once a day   Neoplasm of skin right wrist Ganglion cyst vs other  Recommend consult with PCP or referral to orthopedics   Return in about 2 months (around 04/25/2022) for hand dermatitis .  IMarye Round, CMA, am acting as scribe for Sarina Ser, MD .  Documentation: I have reviewed the above documentation for accuracy and completeness, and I agree with the above.  Sarina Ser, MD

## 2022-02-25 ENCOUNTER — Encounter: Payer: Self-pay | Admitting: Dermatology

## 2022-03-04 ENCOUNTER — Encounter: Payer: Self-pay | Admitting: Gastroenterology

## 2022-03-04 DIAGNOSIS — R162 Hepatomegaly with splenomegaly, not elsewhere classified: Secondary | ICD-10-CM

## 2022-03-04 NOTE — Telephone Encounter (Signed)
Placed referral  

## 2022-03-16 ENCOUNTER — Inpatient Hospital Stay: Payer: Managed Care, Other (non HMO)

## 2022-03-16 ENCOUNTER — Inpatient Hospital Stay: Payer: Managed Care, Other (non HMO) | Attending: Oncology | Admitting: Oncology

## 2022-03-16 ENCOUNTER — Encounter: Payer: Self-pay | Admitting: Oncology

## 2022-03-16 VITALS — BP 120/1 | HR 65 | Temp 97.4°F | Resp 18 | Wt 273.6 lb

## 2022-03-16 DIAGNOSIS — Z801 Family history of malignant neoplasm of trachea, bronchus and lung: Secondary | ICD-10-CM | POA: Diagnosis not present

## 2022-03-16 DIAGNOSIS — Z800 Family history of malignant neoplasm of digestive organs: Secondary | ICD-10-CM | POA: Diagnosis not present

## 2022-03-16 DIAGNOSIS — R162 Hepatomegaly with splenomegaly, not elsewhere classified: Secondary | ICD-10-CM

## 2022-03-16 DIAGNOSIS — Z809 Family history of malignant neoplasm, unspecified: Secondary | ICD-10-CM

## 2022-03-16 LAB — COMPREHENSIVE METABOLIC PANEL
ALT: 18 U/L (ref 0–44)
AST: 15 U/L (ref 15–41)
Albumin: 4.3 g/dL (ref 3.5–5.0)
Alkaline Phosphatase: 80 U/L (ref 38–126)
Anion gap: 9 (ref 5–15)
BUN: 15 mg/dL (ref 6–20)
CO2: 28 mmol/L (ref 22–32)
Calcium: 9.1 mg/dL (ref 8.9–10.3)
Chloride: 101 mmol/L (ref 98–111)
Creatinine, Ser: 0.56 mg/dL (ref 0.44–1.00)
GFR, Estimated: >60 mL/min (ref >60)
Glucose, Bld: 91 mg/dL (ref 70–99)
Potassium: 3.6 mmol/L (ref 3.5–5.1)
Sodium: 138 mmol/L (ref 135–145)
Total Bilirubin: 0.7 mg/dL (ref 0.3–1.2)
Total Protein: 7.9 g/dL (ref 6.5–8.1)

## 2022-03-16 LAB — CBC WITH DIFFERENTIAL/PLATELET
Abs Immature Granulocytes: 0.02 10*3/uL (ref 0.00–0.07)
Basophils Absolute: 0.1 10*3/uL (ref 0.0–0.1)
Basophils Relative: 1 %
Eosinophils Absolute: 0.2 10*3/uL (ref 0.0–0.5)
Eosinophils Relative: 3 %
HCT: 42.6 % (ref 36.0–46.0)
Hemoglobin: 14.2 g/dL (ref 12.0–15.0)
Immature Granulocytes: 0 %
Lymphocytes Relative: 33 %
Lymphs Abs: 2.7 10*3/uL (ref 0.7–4.0)
MCH: 29 pg (ref 26.0–34.0)
MCHC: 33.3 g/dL (ref 30.0–36.0)
MCV: 86.9 fL (ref 80.0–100.0)
Monocytes Absolute: 0.4 10*3/uL (ref 0.1–1.0)
Monocytes Relative: 5 %
Neutro Abs: 4.9 10*3/uL (ref 1.7–7.7)
Neutrophils Relative %: 58 %
Platelets: 352 10*3/uL (ref 150–400)
RBC: 4.9 MIL/uL (ref 3.87–5.11)
RDW: 12.8 % (ref 11.5–15.5)
WBC: 8.3 10*3/uL (ref 4.0–10.5)
nRBC: 0 % (ref 0.0–0.2)

## 2022-03-16 LAB — C-REACTIVE PROTEIN: CRP: 2.2 mg/dL — ABNORMAL HIGH (ref <1.0)

## 2022-03-16 LAB — LACTATE DEHYDROGENASE: LDH: 142 U/L (ref 98–192)

## 2022-03-16 LAB — SEDIMENTATION RATE: Sed Rate: 29 mm/hr — ABNORMAL HIGH (ref 0–20)

## 2022-03-16 NOTE — Progress Notes (Signed)
Patient here to establish care  
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
## 2022-03-17 LAB — IRON AND TIBC
Iron: 70 ug/dL (ref 28–170)
Saturation Ratios: 19 % (ref 10.4–31.8)
TIBC: 368 ug/dL (ref 250–450)
UIBC: 298 ug/dL

## 2022-03-17 LAB — KAPPA/LAMBDA LIGHT CHAINS
Kappa free light chain: 27.2 mg/L — ABNORMAL HIGH (ref 3.3–19.4)
Kappa, lambda light chain ratio: 1.68 — ABNORMAL HIGH (ref 0.26–1.65)
Lambda free light chains: 16.2 mg/L (ref 5.7–26.3)

## 2022-03-17 LAB — FERRITIN: Ferritin: 124 ng/mL (ref 11–307)

## 2022-03-17 LAB — HIV ANTIBODY (ROUTINE TESTING W REFLEX): HIV Screen 4th Generation wRfx: NONREACTIVE

## 2022-03-17 LAB — T4, FREE: Free T4: 0.93 ng/dL (ref 0.61–1.12)

## 2022-03-17 LAB — TSH: TSH: 1.441 u[IU]/mL (ref 0.350–4.500)

## 2022-03-18 LAB — COMP PANEL: LEUKEMIA/LYMPHOMA

## 2022-03-18 LAB — ANTINUCLEAR ANTIBODIES, IFA: ANA Ab, IFA: NEGATIVE

## 2022-03-22 LAB — MULTIPLE MYELOMA PANEL, SERUM
Albumin SerPl Elph-Mcnc: 3.7 g/dL (ref 2.9–4.4)
Albumin/Glob SerPl: 1 (ref 0.7–1.7)
Alpha 1: 0.3 g/dL (ref 0.0–0.4)
Alpha2 Glob SerPl Elph-Mcnc: 0.9 g/dL (ref 0.4–1.0)
B-Globulin SerPl Elph-Mcnc: 1.4 g/dL — ABNORMAL HIGH (ref 0.7–1.3)
Gamma Glob SerPl Elph-Mcnc: 1.3 g/dL (ref 0.4–1.8)
Globulin, Total: 3.8 g/dL (ref 2.2–3.9)
IgA: 371 mg/dL — ABNORMAL HIGH (ref 87–352)
IgG (Immunoglobin G), Serum: 1224 mg/dL (ref 586–1602)
IgM (Immunoglobulin M), Srm: 69 mg/dL (ref 26–217)
Total Protein ELP: 7.5 g/dL (ref 6.0–8.5)

## 2022-04-08 ENCOUNTER — Inpatient Hospital Stay: Payer: Managed Care, Other (non HMO) | Attending: Oncology

## 2022-04-08 ENCOUNTER — Inpatient Hospital Stay: Payer: Managed Care, Other (non HMO) | Attending: Oncology | Admitting: Oncology

## 2022-04-14 ENCOUNTER — Encounter (INDEPENDENT_AMBULATORY_CARE_PROVIDER_SITE_OTHER): Payer: Self-pay

## 2022-04-27 ENCOUNTER — Other Ambulatory Visit: Payer: Self-pay

## 2022-04-28 ENCOUNTER — Ambulatory Visit: Payer: Managed Care, Other (non HMO) | Admitting: Dermatology

## 2022-05-05 ENCOUNTER — Inpatient Hospital Stay: Payer: Managed Care, Other (non HMO)

## 2022-05-05 ENCOUNTER — Other Ambulatory Visit: Payer: Self-pay

## 2022-05-05 ENCOUNTER — Ambulatory Visit: Payer: Managed Care, Other (non HMO) | Admitting: Gastroenterology

## 2022-05-05 ENCOUNTER — Encounter: Payer: Self-pay | Admitting: Oncology

## 2022-05-05 ENCOUNTER — Inpatient Hospital Stay: Payer: Managed Care, Other (non HMO) | Attending: Oncology | Admitting: Oncology

## 2022-05-05 VITALS — BP 132/75 | HR 79 | Temp 98.6°F | Resp 18 | Wt 273.7 lb

## 2022-05-05 DIAGNOSIS — R162 Hepatomegaly with splenomegaly, not elsewhere classified: Secondary | ICD-10-CM

## 2022-05-05 DIAGNOSIS — Z79899 Other long term (current) drug therapy: Secondary | ICD-10-CM | POA: Diagnosis not present

## 2022-05-05 DIAGNOSIS — R7982 Elevated C-reactive protein (CRP): Secondary | ICD-10-CM

## 2022-05-05 DIAGNOSIS — Z809 Family history of malignant neoplasm, unspecified: Secondary | ICD-10-CM | POA: Diagnosis not present

## 2022-05-05 DIAGNOSIS — R634 Abnormal weight loss: Secondary | ICD-10-CM | POA: Insufficient documentation

## 2022-05-05 DIAGNOSIS — R700 Elevated erythrocyte sedimentation rate: Secondary | ICD-10-CM | POA: Diagnosis not present

## 2022-05-05 NOTE — Progress Notes (Signed)
Pt here for follow up. No new concerns voiced.   
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
## 2022-05-06 LAB — RHEUMATOID FACTOR: Rheumatoid fact SerPl-aCnc: <10 IU/mL (ref <14.0)

## 2022-05-07 LAB — CMV DNA, QUANTITATIVE, PCR
CMV DNA Quant: NEGATIVE IU/mL
Log10 CMV Qn DNA Pl: UNDETERMINED log10 IU/mL

## 2022-05-07 LAB — EPSTEIN BARR VRS(EBV DNA BY PCR): EBV DNA QN by PCR: NEGATIVE IU/mL

## 2022-05-08 LAB — GI PROFILE, STOOL, PCR

## 2022-05-08 LAB — BCR-ABL1 FISH
Cells Analyzed: 200
Cells Counted: 200

## 2022-05-11 LAB — JAK2 V617F RFX CALR/MPL/E12-15

## 2022-05-11 LAB — CALR +MPL + E12-E15  (REFLEX)

## 2022-05-13 ENCOUNTER — Other Ambulatory Visit: Payer: Self-pay | Admitting: Family

## 2022-05-13 ENCOUNTER — Ambulatory Visit: Payer: Managed Care, Other (non HMO)

## 2022-05-13 ENCOUNTER — Ambulatory Visit
Admission: RE | Admit: 2022-05-13 | Discharge: 2022-05-13 | Disposition: A | Discharge status: Home / Self Care | Payer: Managed Care, Other (non HMO) | Source: Ambulatory Visit | Attending: Oncology | Admitting: Oncology

## 2022-05-13 DIAGNOSIS — Z1231 Encounter for screening mammogram for malignant neoplasm of breast: Secondary | ICD-10-CM

## 2022-05-13 DIAGNOSIS — R162 Hepatomegaly with splenomegaly, not elsewhere classified: Secondary | ICD-10-CM | POA: Diagnosis not present

## 2022-05-14 ENCOUNTER — Telehealth: Payer: Self-pay

## 2022-05-14 NOTE — Telephone Encounter (Signed)
Please schedule appt in 6 months (MD only)

## 2022-05-14 NOTE — Telephone Encounter (Signed)
-----   Message from Earlie Server, MD sent at 05/13/2022 11:36 PM EDT ----- Let her now that her additional work up results are good.  US showed normal liver and spleen size.  Recommend observation Recommend follow up in 6 months, MD only

## 2022-06-01 ENCOUNTER — Ambulatory Visit (INDEPENDENT_AMBULATORY_CARE_PROVIDER_SITE_OTHER): Payer: Managed Care, Other (non HMO) | Admitting: Family

## 2022-06-01 ENCOUNTER — Encounter: Payer: Self-pay | Admitting: Family

## 2022-06-01 VITALS — BP 136/72 | HR 71 | Temp 98.8°F | Ht 61.0 in | Wt 278.8 lb

## 2022-06-01 DIAGNOSIS — Z0000 Encounter for general adult medical examination without abnormal findings: Secondary | ICD-10-CM

## 2022-06-01 DIAGNOSIS — G8929 Other chronic pain: Secondary | ICD-10-CM

## 2022-06-01 DIAGNOSIS — R7303 Prediabetes: Secondary | ICD-10-CM | POA: Diagnosis not present

## 2022-06-01 DIAGNOSIS — M545 Low back pain, unspecified: Secondary | ICD-10-CM | POA: Diagnosis not present

## 2022-06-01 MED ORDER — GABAPENTIN 300 MG PO CAPS: 300.0000 mg | ORAL_CAPSULE | Freq: Three times a day (TID) | ORAL | 1 refills | Status: DC

## 2022-06-01 MED ORDER — METFORMIN HCL ER 500 MG PO TB24: 500.0000 mg | ORAL_TABLET | Freq: Every evening | ORAL | 2 refills | Status: DC

## 2022-06-01 NOTE — Patient Instructions (Addendum)
Increase gabapentin from '300mg'$  nightly to '300mg'$  in the  morning and '300mg'$  nightly.  The following week, then start '300mg'$  morning, midday and nightly.   Monitor for sedation.   Then if you need, we can increase to '300mg'$  morning, '300mg'$  midday and '600mg'$  nightly.   Lets trial metformin  Start metformin XR with one '500mg'$  tablet at night. After one week, you may increase to two tablets at night ( total of '1000mg'$ ) . The third week, you may take take two tablets at night and one tablet in the morning.  The fourth week, you may take two tablets in the morning ( '1000mg'$  total) and two tablets at night ('1000mg'$  total). This will bring you to a maximum daily dose of '2000mg'$ /day which is maximum dose. Along the way, if you want to increase more slowly, please do as this medication can cause GI discomfort and loose stools which usually get better with time , however some patients find that they can only tolerate a certain dose and cannot increase to maximum dose.   Health Maintenance, Female Adopting a healthy lifestyle and getting preventive care are important in promoting health and wellness. Ask your health care provider about: The right schedule for you to have regular tests and exams. Things you can do on your own to prevent diseases and keep yourself healthy. What should I know about diet, weight, and exercise? Eat a healthy diet  Eat a diet that includes plenty of vegetables, fruits, low-fat dairy products, and lean protein. Do not eat a lot of foods that are high in solid fats, added sugars, or sodium. Maintain a healthy weight Body mass index (BMI) is used to identify weight problems. It estimates body fat based on height and weight. Your health care provider can help determine your BMI and help you achieve or maintain a healthy weight. Get regular exercise Get regular exercise. This is one of the most important things you can do for your health. Most adults should: Exercise for at least 150 minutes  each week. The exercise should increase your heart rate and make you sweat (moderate-intensity exercise). Do strengthening exercises at least twice a week. This is in addition to the moderate-intensity exercise. Spend less time sitting. Even light physical activity can be beneficial. Watch cholesterol and blood lipids Have your blood tested for lipids and cholesterol at 48 years of age, then have this test every 5 years. Have your cholesterol levels checked more often if: Your lipid or cholesterol levels are high. You are older than 48 years of age. You are at high risk for heart disease. What should I know about cancer screening? Depending on your health history and family history, you may need to have cancer screening at various ages. This may include screening for: Breast cancer. Cervical cancer. Colorectal cancer. Skin cancer. Lung cancer. What should I know about heart disease, diabetes, and high blood pressure? Blood pressure and heart disease High blood pressure causes heart disease and increases the risk of stroke. This is more likely to develop in people who have high blood pressure readings or are overweight. Have your blood pressure checked: Every 3-5 years if you are 87-60 years of age. Every year if you are 34 years old or older. Diabetes Have regular diabetes screenings. This checks your fasting blood sugar level. Have the screening done: Once every three years after age 29 if you are at a normal weight and have a low risk for diabetes. More often and at a younger age  if you are overweight or have a high risk for diabetes. What should I know about preventing infection? Hepatitis B If you have a higher risk for hepatitis B, you should be screened for this virus. Talk with your health care provider to find out if you are at risk for hepatitis B infection. Hepatitis C Testing is recommended for: Everyone born from [birth year] through 1967. Anyone with known risk factors for  hepatitis C. Sexually transmitted infections (STIs) Get screened for STIs, including gonorrhea and chlamydia, if: You are sexually active and are younger than 48 years of age. You are older than 48 years of age and your health care provider tells you that you are at risk for this type of infection. Your sexual activity has changed since you were last screened, and you are at increased risk for chlamydia or gonorrhea. Ask your health care provider if you are at risk. Ask your health care provider about whether you are at high risk for HIV. Your health care provider may recommend a prescription medicine to help prevent HIV infection. If you choose to take medicine to prevent HIV, you should first get tested for HIV. You should then be tested every 3 months for as long as you are taking the medicine. Pregnancy If you are about to stop having your period (premenopausal) and you may become pregnant, seek counseling before you get pregnant. Take 400 to 800 micrograms (mcg) of folic acid every day if you become pregnant. Ask for birth control (contraception) if you want to prevent pregnancy. Osteoporosis and menopause Osteoporosis is a disease in which the bones lose minerals and strength with aging. This can result in bone fractures. If you are 36 years old or older, or if you are at risk for osteoporosis and fractures, ask your health care provider if you should: Be screened for bone loss. Take a calcium or vitamin D supplement to lower your risk of fractures. Be given hormone replacement therapy (HRT) to treat symptoms of menopause. Follow these instructions at home: Alcohol use Do not drink alcohol if: Your health care provider tells you not to drink. You are pregnant, may be pregnant, or are planning to become pregnant. If you drink alcohol: Limit how much you have to: 0-1 drink a day. Know how much alcohol is in your drink. In the [place] one drink equals one 12 oz bottle of beer (355 mL), one 5  oz glass of wine (148 mL), or one 1 oz glass of hard liquor (44 mL). Lifestyle Do not use any products that contain nicotine or tobacco. These products include cigarettes, chewing tobacco, and vaping devices, such as e-cigarettes. If you need help quitting, ask your health care provider. Do not use street drugs. Do not share needles. Ask your health care provider for help if you need support or information about quitting drugs. General instructions Schedule regular health, dental, and eye exams. Stay current with your vaccines. Tell your health care provider if: You often feel depressed. You have ever been abused or do not feel safe at home. Summary Adopting a healthy lifestyle and getting preventive care are important in promoting health and wellness. Follow your health care provider's instructions about healthy diet, exercising, and getting tested or screened for diseases. Follow your health care provider's instructions on monitoring your cholesterol and blood pressure. This information is not intended to replace advice given to you by your health care provider. Make sure you discuss any questions you have with your health care provider. Document  Revised: 01/12/2021 Document Reviewed: 01/12/2021 Elsevier Patient Education  [employer].

## 2022-06-01 NOTE — Assessment & Plan Note (Signed)
Lab Results  Component Value Date   HGBA1C 5.9 05/25/2021   History of prediabetes.  Trulicity was not approved by her insurance. Tried  Wellbutrin in the past.  We opted for metformin.  She will titrate.  Close follow up.

## 2022-06-01 NOTE — Assessment & Plan Note (Signed)
Chronic, suboptimal control.  Improved with weight loss.  Advise she may increase gabapentin from 300 mg nightly to 300 mg every morning, midday and night.  She will slowly titrate this.  We discussed as well we could increase Cymbalta from 60 mg to 90 mg.  She will let me how she doing.

## 2022-06-01 NOTE — Assessment & Plan Note (Signed)
Clinical breast exam performed today.  Deferred pelvic exam the absence of complaints and Pap smear is up-to-date.  Mammogram is scheduled.  Encouraged walking program.
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
## 2022-06-08 ENCOUNTER — Ambulatory Visit
Admission: RE | Admit: 2022-06-08 | Discharge: 2022-06-08 | Disposition: A | Discharge status: Home / Self Care | Payer: Managed Care, Other (non HMO) | Source: Ambulatory Visit | Attending: Family | Admitting: Family

## 2022-06-08 ENCOUNTER — Other Ambulatory Visit: Payer: Self-pay | Admitting: Family

## 2022-06-08 DIAGNOSIS — Z1231 Encounter for screening mammogram for malignant neoplasm of breast: Secondary | ICD-10-CM | POA: Diagnosis present

## 2022-06-08 DIAGNOSIS — M545 Low back pain, unspecified: Secondary | ICD-10-CM

## 2022-06-09 ENCOUNTER — Encounter: Payer: Self-pay | Admitting: Family

## 2022-06-10 ENCOUNTER — Other Ambulatory Visit: Payer: Self-pay | Admitting: Family

## 2022-06-10 DIAGNOSIS — R921 Mammographic calcification found on diagnostic imaging of breast: Secondary | ICD-10-CM

## 2022-06-10 DIAGNOSIS — R928 Other abnormal and inconclusive findings on diagnostic imaging of breast: Secondary | ICD-10-CM

## 2022-06-14 ENCOUNTER — Other Ambulatory Visit: Payer: Self-pay | Admitting: Family

## 2022-06-14 ENCOUNTER — Other Ambulatory Visit (INDEPENDENT_AMBULATORY_CARE_PROVIDER_SITE_OTHER): Payer: Managed Care, Other (non HMO)

## 2022-06-14 DIAGNOSIS — R7303 Prediabetes: Secondary | ICD-10-CM | POA: Diagnosis not present

## 2022-06-14 DIAGNOSIS — Z0000 Encounter for general adult medical examination without abnormal findings: Secondary | ICD-10-CM | POA: Diagnosis not present

## 2022-06-14 LAB — LIPID PANEL
Cholesterol: 207 mg/dL — ABNORMAL HIGH (ref 0–200)
HDL: 53.7 mg/dL (ref >39.00)
LDL Cholesterol: 134 mg/dL — ABNORMAL HIGH (ref 0–99)
NonHDL: 153.06
Total CHOL/HDL Ratio: 4
Triglycerides: 96 mg/dL (ref 0.0–149.0)
VLDL: 19.2 mg/dL (ref 0.0–40.0)

## 2022-06-14 LAB — VITAMIN D 25 HYDROXY (VIT D DEFICIENCY, FRACTURES): VITD: 15.02 ng/mL — ABNORMAL LOW (ref 30.00–100.00)

## 2022-06-14 LAB — HEMOGLOBIN A1C: Hgb A1c MFr Bld: 5.7 % (ref 4.6–6.5)

## 2022-06-14 LAB — TSH: TSH: 1.58 u[IU]/mL (ref 0.35–5.50)

## 2022-06-14 MED ORDER — METFORMIN HCL ER 500 MG PO TB24: 1000.0000 mg | ORAL_TABLET | Freq: Two times a day (BID) | ORAL | 3 refills | Status: DC

## 2022-06-15 NOTE — Telephone Encounter (Signed)
Noted! Thanks [employer]

## 2022-06-16 ENCOUNTER — Other Ambulatory Visit: Payer: Self-pay | Admitting: Family

## 2022-06-16 ENCOUNTER — Ambulatory Visit
Admission: RE | Admit: 2022-06-16 | Discharge: 2022-06-16 | Disposition: A | Discharge status: Home / Self Care | Payer: Managed Care, Other (non HMO) | Source: Ambulatory Visit | Attending: Family | Admitting: Family

## 2022-06-16 DIAGNOSIS — R921 Mammographic calcification found on diagnostic imaging of breast: Secondary | ICD-10-CM | POA: Insufficient documentation

## 2022-06-16 DIAGNOSIS — R928 Other abnormal and inconclusive findings on diagnostic imaging of breast: Secondary | ICD-10-CM | POA: Diagnosis not present

## 2022-06-21 ENCOUNTER — Encounter: Payer: Self-pay | Admitting: Family

## 2022-06-28 ENCOUNTER — Telehealth: Payer: Self-pay

## 2022-06-28 NOTE — Telephone Encounter (Addendum)
Patient verbalized understanding of instructions  

## 2022-06-28 NOTE — Telephone Encounter (Signed)
Please let her know to make sure to have bowel movement every day, take plenty of fluids, MiraLAX as needed.  She should let us know if her symptoms are worsening in next 24 hours  RV

## 2022-06-28 NOTE — Telephone Encounter (Signed)
Patient is calling because last Tuesday she began to have sharp LLQ abdominal pain. She states she thought her ICD was messed up so she went to the OBGYN. They did a vaginal exam and vaginal ultrasound. They said her IUD was fine but did have a lot of stool in her colon. She states she also had a vaginal infection so has been on a antibiotic since Friday. She states the pain in her LLQ is some better but is still hurting. She states she had a couple little bowel movements on Saturday and yesterday one normal soft bowel movement. Asked if she takes anything OTC for constipation and she states she does not because she does not have constipation. Has been nausea all the time is eating with the antibiotics.

## 2022-06-29 ENCOUNTER — Ambulatory Visit
Admission: RE | Admit: 2022-06-29 | Discharge: 2022-06-29 | Disposition: A | Discharge status: Home / Self Care | Payer: Managed Care, Other (non HMO) | Source: Ambulatory Visit | Attending: Family

## 2022-06-29 ENCOUNTER — Ambulatory Visit
Admission: RE | Admit: 2022-06-29 | Discharge: 2022-06-29 | Disposition: A | Discharge status: Home / Self Care | Payer: Managed Care, Other (non HMO) | Source: Ambulatory Visit | Attending: Family | Admitting: Family

## 2022-06-29 DIAGNOSIS — R928 Other abnormal and inconclusive findings on diagnostic imaging of breast: Secondary | ICD-10-CM | POA: Insufficient documentation

## 2022-06-29 DIAGNOSIS — R921 Mammographic calcification found on diagnostic imaging of breast: Secondary | ICD-10-CM

## 2022-06-29 HISTORY — PX: BREAST BIOPSY: SHX20

## 2022-06-29 NOTE — Telephone Encounter (Signed)
Spoke to patient in regards to her form and informed her that I was emailing it back to her on 06/30/22

## 2022-06-30 LAB — SURGICAL PATHOLOGY

## 2022-09-14 ENCOUNTER — Ambulatory Visit: Payer: Managed Care, Other (non HMO) | Admitting: Gastroenterology

## 2022-11-12 ENCOUNTER — Ambulatory Visit: Payer: Managed Care, Other (non HMO) | Admitting: Oncology

## 2022-12-05 IMAGING — CT CT ABD-PEL WO/W CM
3 of 12 series · 12 of 46 positions shown, 18 images · IV contrast (agent unspecified)
Comparison: CT renal stone 06/09/2018

CLINICAL DATA: Gross hematuria

EXAM:
CT ABDOMEN AND PELVIS WITHOUT AND WITH CONTRAST
TECHNIQUE: Multidetector CT imaging of the abdomen and pelvis was performed
following the standard protocol before and following the bolus
administration of intravenous contrast.

[Series 5: cor without without pre 2.00 cor · coronal · non-contrast · 0.69mm/px · 2 of 174 slices shown, 3 images]
[im 58/174  soft-tissue]
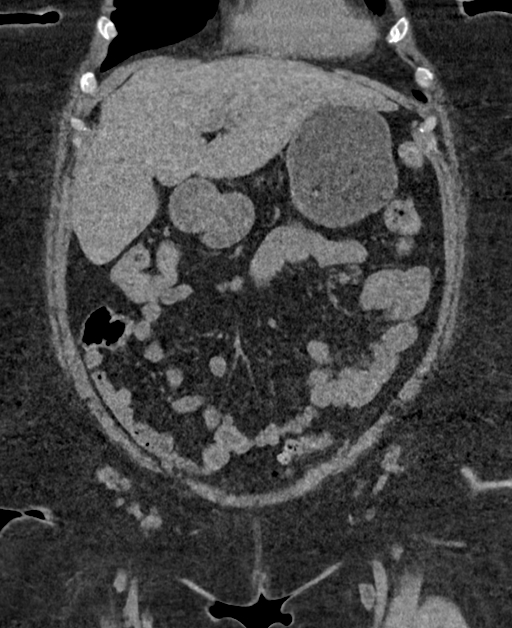
[im 58/174  bone]
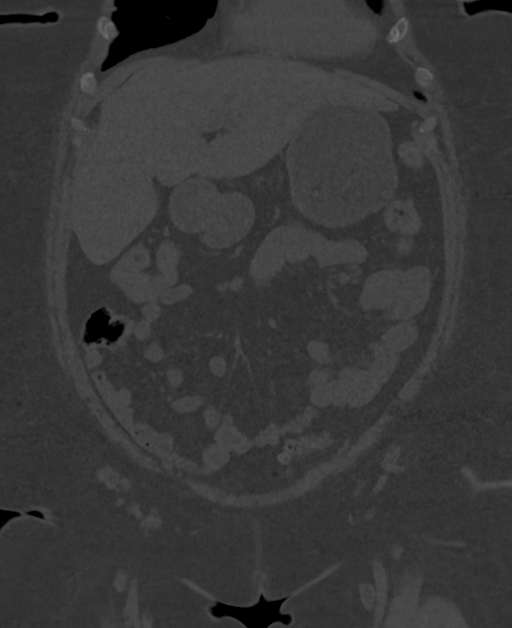
[im 116/174  soft-tissue]
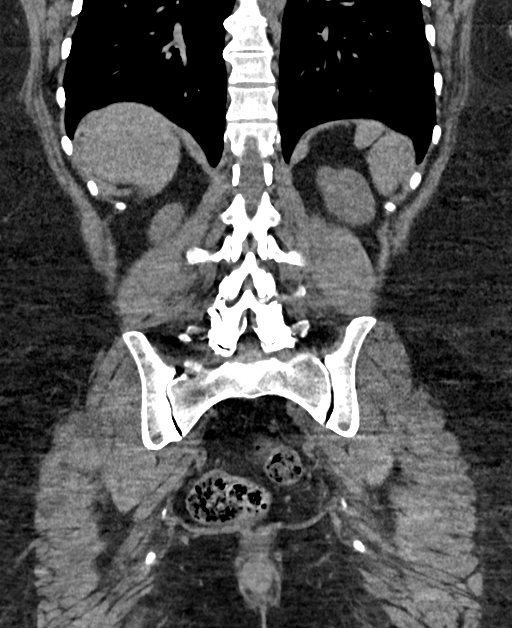

[Series 9: axial with hematuria with 5.00 · axial · 0.68mm/px · z∈[-1428,-1128]mm · 6 of 86 slices shown, 11 images]
[im 13/86  soft-tissue]
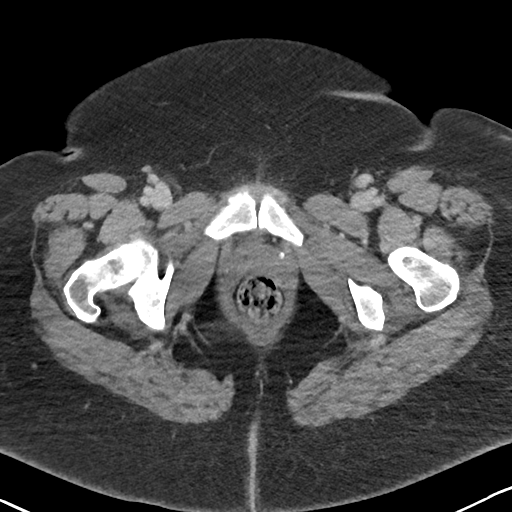
[im 13/86  bone]
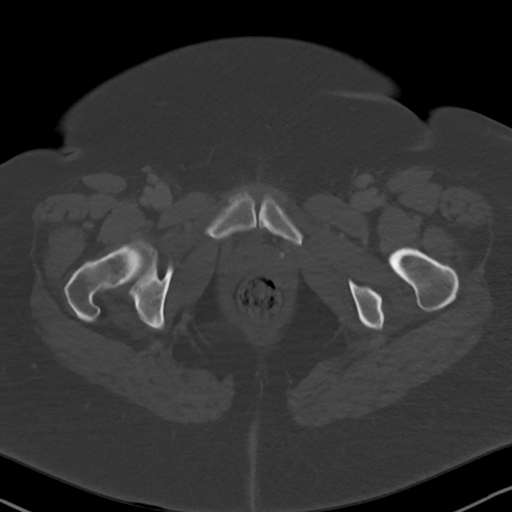
[im 25/86  soft-tissue]
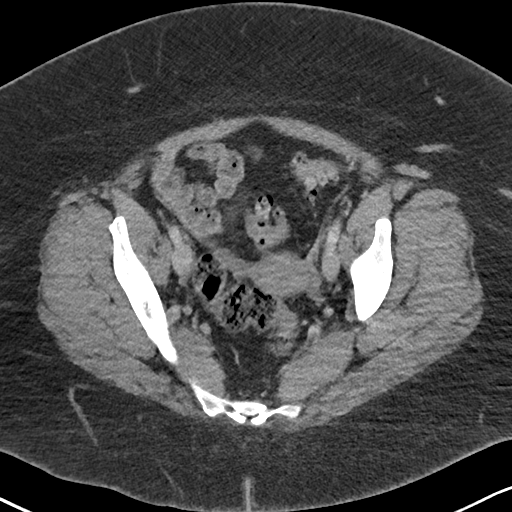
[im 37/86  soft-tissue]
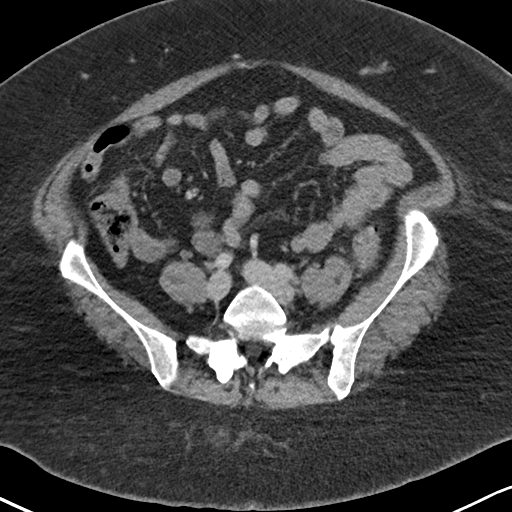
[im 37/86  lung]
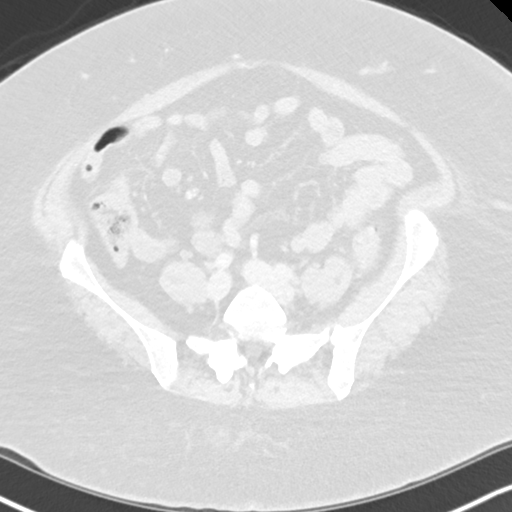
[im 49/86  soft-tissue]
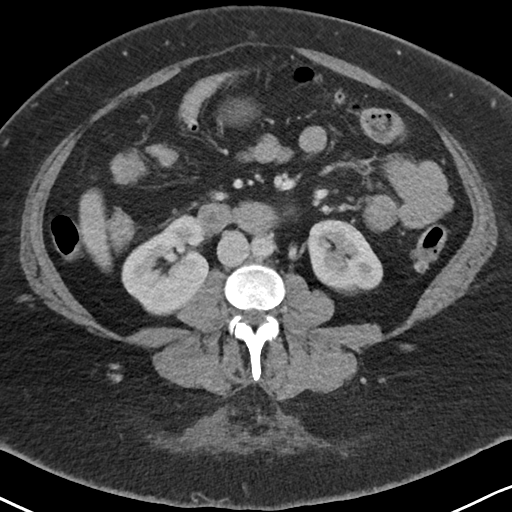
[im 49/86  lung]
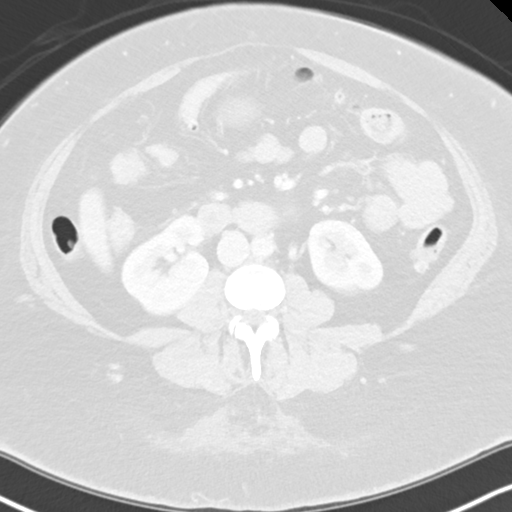
[im 61/86  soft-tissue]
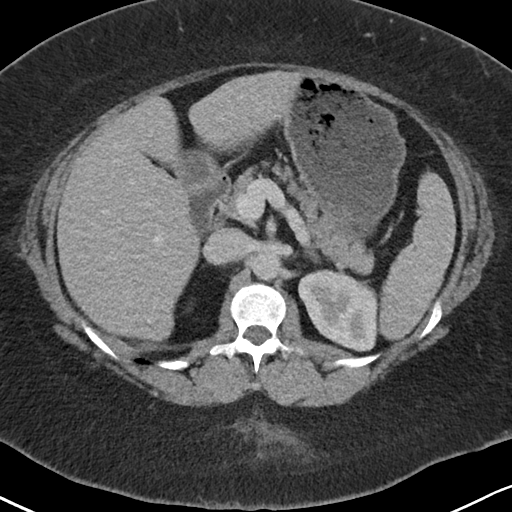
[im 61/86  lung]
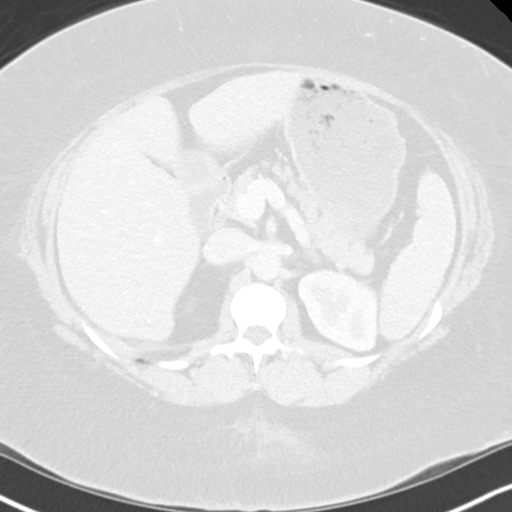
[im 73/86  soft-tissue]
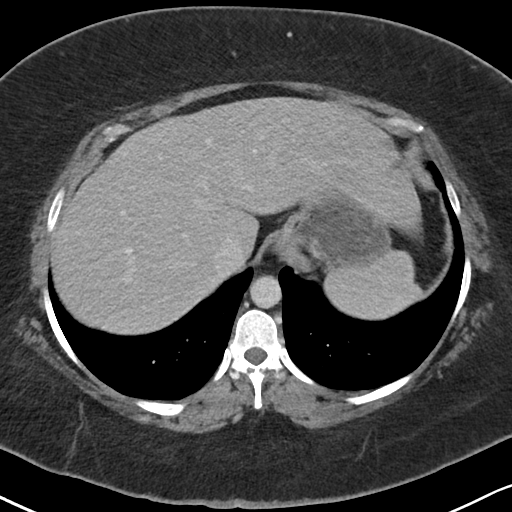
[im 73/86  lung]
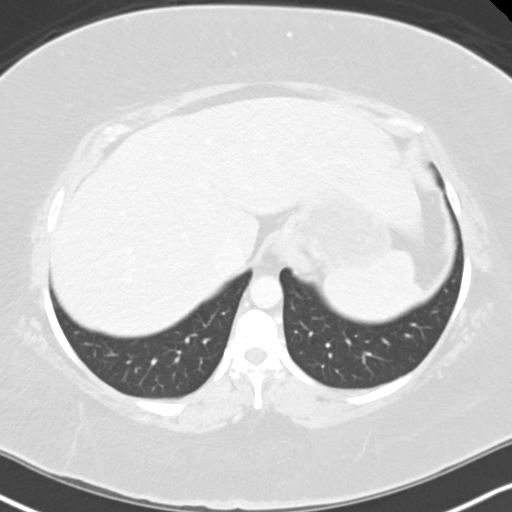

[Series 17: axial delay delay prone 5.00 · axial · delayed · 0.68mm/px · z∈[-1558,-1368]mm · 4 of 89 slices shown]
[im 13/89  soft-tissue]
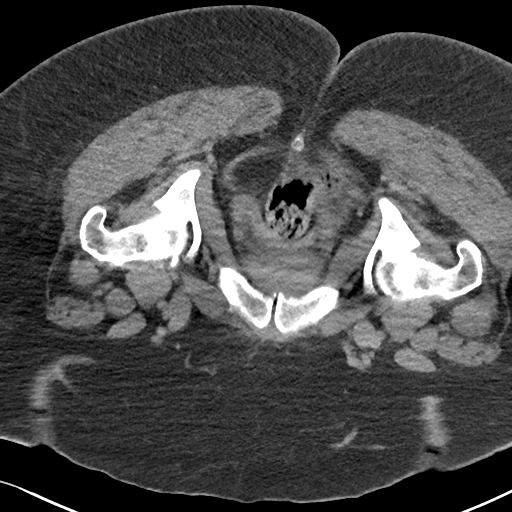
[im 26/89  soft-tissue]
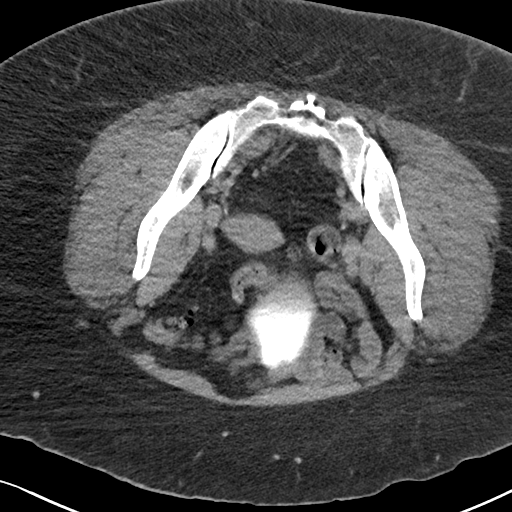
[im 38/89  soft-tissue]
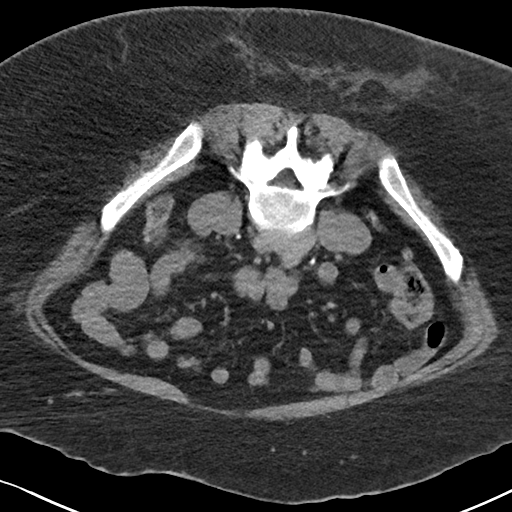
[im 51/89  soft-tissue]
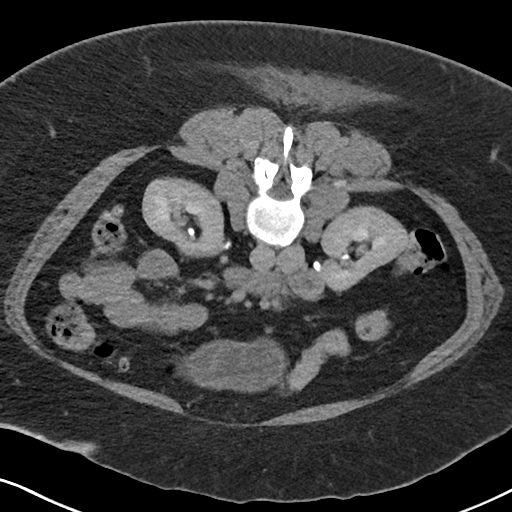

[12 of 46 positions shown; findings below may reference images not displayed]

RADIATION DOSE REDUCTION: This exam was performed according to the
departmental dose-optimization program which includes automated
exposure control, adjustment of the mA and/or kV according to
patient size and/or use of iterative reconstruction technique.

CONTRAST:  125mL OMNIPAQUE IOHEXOL 300 MG/ML  SOLN
FINDINGS: Lower chest: No acute abnormality.

Hepatobiliary: Liver is mildly enlarged without nodularity or
suspicious hepatic mass. Gallbladder is surgically absent. No
biliary ductal dilatation.

Pancreas: Unremarkable. No pancreatic ductal dilatation or
surrounding inflammatory changes.

Spleen: Mildly enlarged measuring 13 cm in length.

Adrenals/Urinary Tract: Adrenal glands appear normal. 2 mm
nonobstructing calculus in the lower pole left kidney. No
hydronephrosis identified bilaterally. No enhancing renal mass or
suspicious collecting system filling defects visualized. Ureters are
normal caliber. Urinary bladder appears normal.

Stomach/Bowel: No bowel obstruction, free air or pneumatosis.
Colonic diverticulosis. No bowel wall edema visualized. Appendix is
normal.

Vascular/Lymphatic: No significant vascular findings are present. No
enlarged abdominal or pelvic lymph nodes.

Reproductive: Uterus and bilateral adnexa are unremarkable.

Other: No ascites.

Musculoskeletal: Degenerative changes in the lumbar spine. No
suspicious bony lesions identified.
IMPRESSION: 1. Small left renal calculus.  No evidence of obstructive uropathy.
2. Colonic diverticulosis.
3. Mild hepatosplenomegaly.

## 2022-12-15 ENCOUNTER — Ambulatory Visit: Payer: Managed Care, Other (non HMO) | Admitting: Family

## 2022-12-15 ENCOUNTER — Encounter: Payer: Self-pay | Admitting: Family

## 2022-12-15 ENCOUNTER — Telehealth: Payer: Self-pay | Admitting: Family

## 2022-12-15 VITALS — BP 130/70 | HR 70 | Temp 98.1°F | Ht 61.0 in | Wt 295.6 lb

## 2022-12-15 DIAGNOSIS — G8929 Other chronic pain: Secondary | ICD-10-CM | POA: Diagnosis not present

## 2022-12-15 DIAGNOSIS — Z6841 Body Mass Index (BMI) 40.0 and over, adult: Secondary | ICD-10-CM | POA: Diagnosis not present

## 2022-12-15 DIAGNOSIS — Z8639 Personal history of other endocrine, nutritional and metabolic disease: Secondary | ICD-10-CM | POA: Diagnosis not present

## 2022-12-15 DIAGNOSIS — M545 Low back pain, unspecified: Secondary | ICD-10-CM | POA: Diagnosis not present

## 2022-12-15 LAB — VITAMIN D 25 HYDROXY (VIT D DEFICIENCY, FRACTURES): VITD: 43.33 ng/mL (ref 30.00–100.00)

## 2022-12-15 MED ORDER — BUPROPION HCL ER (XL) 150 MG PO TB24: 150.0000 mg | ORAL_TABLET | Freq: Every day | ORAL | 1 refills | Status: DC

## 2022-12-15 MED ORDER — DULOXETINE HCL 30 MG PO CPEP: 30.0000 mg | ORAL_CAPSULE | Freq: Every day | ORAL | 1 refills | Status: DC

## 2022-12-15 MED ORDER — DULOXETINE HCL 30 MG PO CPEP: 30.0000 mg | ORAL_CAPSULE | Freq: Every day | ORAL | 0 refills | Status: DC

## 2022-12-15 NOTE — Patient Instructions (Addendum)
I consulted with our pharmacist this morning.  She was comfortable with you being on Cymbalta and Wellbutrin.  I think being on a lower dose of Cymbalta 30mg   is wise so we can escalate Wellbutrin if needed.    Decrease cymbalta from 60mg  to 30mg . You may stay on cymbalta 30mg  for now while we monitor for symptoms of increased serotonin with addition of Wellbutrin.   Please stay vigilant for dizziness, drowsiness and blurred vision while on Cymbalta and Wellbutrin   Start wellbutrin 150mg  every morning.  After 5 to 7 days, please increase to Wellbutrin 300 mg taken in the morning as well.   Increase metformin to 2000mg  taken all at night.   Drugs to consider-  Preference would be zepbound or wegovy.   Older shorter term alternatives would be phentermine, contrave.   Nice to see you!

## 2022-12-15 NOTE — Assessment & Plan Note (Signed)
Titrate metformin to 2000 mg nightly.  Start Wellbutrin 150 mg and escalate to 300 mg qam in 1 week.  Consulted with clinical pharmacist Nilda Simmer in regards to drug interaction between Wellbutrin and Cymbalta. Discussed this with patient and advised her to monitor for symptoms of Cymbalta toxicity.  We agreed to a low-dose Cymbalta as it appears to have been helpful for low back pain.

## 2022-12-15 NOTE — Assessment & Plan Note (Signed)
Pleased that low back pain has improved overall.  We agreed to decrease Cymbalta to 30 mg daily due to the addition of Wellbutrin.  She will continue gabapentin 300 mg 3 times daily

## 2022-12-15 NOTE — Progress Notes (Signed)
Assessment & Plan:  Chronic left-sided low back pain without sciatica Assessment & Plan: Pleased that low back pain has improved overall.  We agreed to decrease Cymbalta to 30 mg daily due to the addition of Wellbutrin.  She will continue gabapentin 300 mg 3 times daily  Orders: -     DULoxetine HCl; Take 1 capsule (30 mg total) by mouth daily.  Dispense: 90 capsule; Refill: 1  BMI 50.0-59.9, adult Assessment & Plan: Titrate metformin to 2000 mg nightly.  Start Wellbutrin 150 mg and escalate to 300 mg qam in 1 week.  Consulted with clinical pharmacist Nilda Simmer in regards to drug interaction between Wellbutrin and Cymbalta. Discussed this with patient and advised her to monitor for symptoms of Cymbalta toxicity.  We agreed to a low-dose Cymbalta as it appears to have been helpful for low back pain.    Orders: -     buPROPion HCl ER (XL); Take 1 tablet (150 mg total) by mouth daily. After 5-6 days start 300mg  po qam.  Dispense: 90 tablet; Refill: 1  History of vitamin D deficiency -     VITAMIN D 25 Hydroxy (Vit-D Deficiency, Fractures)     Return precautions given.   Risks, benefits, and alternatives of the medications and treatment plan prescribed today were discussed, and patient expressed understanding.   Education regarding symptom management and diagnosis given to patient on AVS either electronically or printed.  Return in about 4 months (around 04/16/2023).  Danielle Plowman, FNP  Subjective:    Patient ID: Danielle Browning, female    DOB: [birth year], 47 y.o.   MRN: [id number]  CC: Danielle Browning is a 47 y.o. female who presents today for follow up.   HPI: Compliant with metformin 1000mg  qhs. She has some nausea on medication. She is frustrated by weight gain.  She is monitoring her diet, following a low-carb diet and most days restricting her caloric intake to 1100 calories per day.    No h/o seizure, anorexia or bulimia.   No alcohol use  Low back pain has  improved as she is following with a chiropractor.  She is compliant with Cymbalta 60 mg daily, gabapentin 300mg  tid which has been helpful for pain  Allergies: Penicillins, Other, Fruit & vegetable daily [nutritional supplements], and Sulfa antibiotics Current Outpatient Medications on File Prior to Visit  Medication Sig Dispense Refill   albuterol (VENTOLIN HFA) 108 (90 Base) MCG/ACT inhaler Inhale 2 puffs into the lungs every 6 (six) hours as needed for wheezing or shortness of breath. 6.7 g 0   cetirizine (ZYRTEC) 10 MG tablet Take 1 tablet (10 mg total) by mouth daily. 30 tablet 2   Cholecalciferol (VITAMIN D3 PO) Take 4,000 Int'l Units by mouth.     Clotrimazole 1 % OINT Apply 1 application topically 2 (two) times daily. 56.7 g 1   cyclobenzaprine (FLEXERIL) 10 MG tablet Take 0.5-1 tablets (5-10 mg total) by mouth at bedtime as needed for muscle spasms. 30 tablet 1   gabapentin (NEURONTIN) 300 MG capsule Take 1 capsule (300 mg total) by mouth 3 (three) times daily. 90 capsule 1   meloxicam (MOBIC) 7.5 MG tablet Take 1 tablet (7.5 mg total) by mouth 2 (two) times daily as needed for pain. 60 tablet 5   metFORMIN (GLUCOPHAGE-XR) 500 MG 24 hr tablet Take 2 tablets (1,000 mg total) by mouth in the morning and at bedtime. 360 tablet 3   Misc Natural Products (TURMERIC CURCUMIN) CAPS Take 3,000 capsules by mouth.  mometasone (ELOCON) 0.1 % cream Apply 1 application. topically daily as needed (Rash). Qd up to 5 days a week to eczema on hands and left foot prn flares 45 g 3   mupirocin ointment (BACTROBAN) 2 % Apply 1 application topically 2 (two) times daily. To lesions on abdomen. 22 g 0   Ruxolitinib Phosphate (OPZELURA) 1.5 % CREA Apply to hands once a day 60 g 2   triamcinolone (KENALOG) 0.025 % cream Apply 1 application topically 2 (two) times daily. 80 g 2   diphenhydrAMINE (BENADRYL) spray Apply topically every 4 (four) hours as needed for itching. (Patient not taking: Reported on  05/05/2022) 60 mL 0   [DISCONTINUED] fluticasone (FLONASE) 50 MCG/ACT nasal spray Place 2 sprays into both nostrils daily. 16 g 0   No current facility-administered medications on file prior to visit.    Review of Systems  Constitutional:  Negative for chills and fever.  Respiratory:  Negative for cough.   Cardiovascular:  Negative for chest pain and palpitations.  Gastrointestinal:  Negative for nausea and vomiting.      Objective:    BP 130/70   Pulse 70   Temp 98.1 F (36.7 C) (Oral)   Ht 5\' 1"  (1.549 m)   Wt 295 lb 9.6 oz (134.1 kg)   SpO2 96%   BMI 55.85 kg/m  BP Readings from Last 3 Encounters:  12/15/22 130/70  06/01/22 136/72  05/05/22 132/75   Wt Readings from Last 3 Encounters:  12/15/22 295 lb 9.6 oz (134.1 kg)  06/01/22 278 lb 12.8 oz (126.5 kg)  05/05/22 273 lb 11.2 oz (124.1 kg)    Physical Exam Vitals reviewed.  Constitutional:      Appearance: She is well-developed.  Eyes:     Conjunctiva/sclera: Conjunctivae normal.  Cardiovascular:     Rate and Rhythm: Normal rate and regular rhythm.     Pulses: Normal pulses.     Heart sounds: Normal heart sounds.  Pulmonary:     Effort: Pulmonary effort is normal.     Breath sounds: Normal breath sounds. No wheezing, rhonchi or rales.  Skin:    General: Skin is warm and dry.  Neurological:     Mental Status: She is alert.  Psychiatric:        Speech: Speech normal.        Behavior: Behavior normal.        Thought Content: Thought content normal.

## 2022-12-15 NOTE — Telephone Encounter (Signed)
Spoke to Amy and gave correct rx for Cymbalta

## 2022-12-15 NOTE — Telephone Encounter (Signed)
[facility] from [facility] called stating she need clarification on directions for DULoxetine

## 2023-02-06 ENCOUNTER — Emergency Department: Payer: Managed Care, Other (non HMO)

## 2023-02-06 ENCOUNTER — Other Ambulatory Visit: Payer: Self-pay

## 2023-02-06 ENCOUNTER — Emergency Department
Admission: EM | Admit: 2023-02-06 | Discharge: 2023-02-06 | Disposition: A | Discharge status: Home / Self Care | Payer: Managed Care, Other (non HMO) | Attending: Emergency Medicine | Admitting: Emergency Medicine

## 2023-02-06 DIAGNOSIS — M25551 Pain in right hip: Secondary | ICD-10-CM | POA: Diagnosis present

## 2023-02-06 LAB — URINALYSIS, ROUTINE W REFLEX MICROSCOPIC
Bacteria, UA: NONE SEEN
Bilirubin Urine: NEGATIVE
Glucose, UA: NEGATIVE mg/dL
Hgb urine dipstick: NEGATIVE
Ketones, ur: NEGATIVE mg/dL
Nitrite: NEGATIVE
Protein, ur: NEGATIVE mg/dL
Specific Gravity, Urine: 1.032 — ABNORMAL HIGH (ref 1.005–1.030)
pH: 5 (ref 5.0–8.0)

## 2023-02-06 LAB — POC URINE PREG, ED: Preg Test, Ur: NEGATIVE

## 2023-02-06 MED ORDER — LIDOCAINE 5 % EX PTCH: 1.0000 | MEDICATED_PATCH | Freq: Two times a day (BID) | CUTANEOUS | 0 refills | Status: AC

## 2023-02-06 MED ORDER — LIDOCAINE 5 % EX PTCH
1.0000 | MEDICATED_PATCH | Freq: Once | CUTANEOUS | Status: DC
  Administered 2023-02-06: 1 via TRANSDERMAL
  Filled 2023-02-06: qty 1

## 2023-02-06 MED ORDER — ACETAMINOPHEN 500 MG PO TABS
1000.0000 mg | ORAL_TABLET | Freq: Once | ORAL | Status: AC
  Administered 2023-02-06: 1000 mg via ORAL
  Filled 2023-02-06: qty 2

## 2023-02-06 MED ORDER — OXYCODONE HCL 5 MG PO TABS: 5.0000 mg | ORAL_TABLET | Freq: Four times a day (QID) | ORAL | 0 refills | Status: AC | PRN

## 2023-02-06 MED ORDER — PREDNISONE 20 MG PO TABS: 40.0000 mg | ORAL_TABLET | Freq: Every day | ORAL | 0 refills | Status: AC

## 2023-02-06 MED ORDER — LIDOCAINE 5 % EX PTCH: 1.0000 | MEDICATED_PATCH | Freq: Two times a day (BID) | CUTANEOUS | 0 refills | Status: DC

## 2023-02-06 MED ORDER — OXYCODONE HCL 5 MG PO TABS
5.0000 mg | ORAL_TABLET | Freq: Once | ORAL | Status: AC
  Administered 2023-02-06: 5 mg via ORAL
  Filled 2023-02-06: qty 1

## 2023-02-06 MED ORDER — KETOROLAC TROMETHAMINE 30 MG/ML IJ SOLN
30.0000 mg | Freq: Once | INTRAMUSCULAR | Status: AC
  Administered 2023-02-06: 30 mg via INTRAMUSCULAR
  Filled 2023-02-06: qty 1

## 2023-02-06 MED ORDER — PREDNISONE 20 MG PO TABS: 40.0000 mg | ORAL_TABLET | Freq: Every day | ORAL | 0 refills | Status: DC

## 2023-02-06 NOTE — ED Triage Notes (Signed)
Pt reports pain to her right hip. Pt reports has a lot of issues with her hip and had an injection several weeks ago but now the pain is back.

## 2023-02-06 NOTE — ED Notes (Signed)
Right hip pain that has been ongoing.

## 2023-02-06 NOTE — ED Provider Notes (Signed)
[facility] Provider Note    Event Date/Time   First MD Initiated Contact with Patient 02/06/23 1116     (approximate)   History   Hip Pain   HPI  Danielle Browning is a 47 y.o. female with history of chronic low back pain and new right hip pain being worked up by orthopedics who comes in with concern for worsening right hip pain.  Patient reports similar pain that she was seen by orthopedics.  I reviewed the note from 01/12/2023.  Where patient was seen at [facility] clinic.  She reports that obtaining today is very similar in character but maybe is a little bit more severe.  Denies any numbness down the leg, able to move the leg and still ambulate but she just reports increasing pain.  Denies any falls.  She just reports that the pain is worse at nighttime.  She reports taking some ibuprofen earlier this morning but no other medications.  She states that after she got the injections by orthopedics that she actually felt a lot better but now the pain is come back.  Physical Exam   Triage Vital Signs: ED Triage Vitals  Enc Vitals Group     BP 02/06/23 1025 132/81     Pulse Rate 02/06/23 1025 85     Resp 02/06/23 1025 20     Temp 02/06/23 1025 98.6 F (37 C)     Temp Source 02/06/23 1025 Oral     SpO2 02/06/23 1025 96 %     Weight 02/06/23 1024 295 lb 6.7 oz (134 kg)     Height 02/06/23 1024 5\' 1"  (1.549 m)     Head Circumference --      Peak Flow --      Pain Score 02/06/23 1024 8     Pain Loc --      Pain Edu? --      Excl. in GC? --     Most recent vital signs: Vitals:   02/06/23 1025  BP: 132/81  Pulse: 85  Resp: 20  Temp: 98.6 F (37 C)  SpO2: 96%     General: Awake, no distress.  CV:  Good peripheral perfusion.  Resp:  Normal effort.  Abd:  No distention.  Other:  No warmth or swelling noted of the right hip.  She is able to range it but has some pain.  Able to ambulate.  Able to flex and extend the ankle good distal pulse sensation intact in  the leg able to flex and extend the knee.  No significant CTL spine tenderness. Rectal exam with tone.  Sensation intact.  ED Results / Procedures / Treatments   Labs (all labs ordered are listed, but only abnormal results are displayed) Labs Reviewed  URINALYSIS, ROUTINE W REFLEX MICROSCOPIC  POC URINE PREG, ED      RADIOLOGY I have reviewed the xray personally and interpreted no evidence of any fracture  PROCEDURES:  Critical Care performed: No  Procedures   MEDICATIONS ORDERED IN ED: Medications  lidocaine (LIDODERM) 5 % 1 patch (1 patch Transdermal Patch Applied 02/06/23 1214)  ketorolac (TORADOL) 30 MG/ML injection 30 mg (30 mg Intramuscular Given 02/06/23 1214)  acetaminophen (TYLENOL) tablet 1,000 mg (1,000 mg Oral Given 02/06/23 1214)  oxyCODONE (Oxy IR/ROXICODONE) immediate release tablet 5 mg (5 mg Oral Given 02/06/23 1214)     IMPRESSION / MDM / ASSESSMENT AND PLAN / ED COURSE  I reviewed the triage vital signs and the nursing  notes.   Patient's presentation is most consistent with acute, uncomplicated illness.   Patient comes in with pain in the right hip.  Seems similar to her prior pain will get repeat x-ray just to ensure no mass, new fracture although she denies any falls.  She is neurovascularly intact.  No signs of cord compression.  No signs of septic joint.  Abdomen is soft nontender.  Pain is worse with movement therefore suspect more likely musculoskeletal in nature.  She had negative pregnancy test and her urine has slight amount of RBCs in it but her pain is not flank in nature it is over her right hip.  Does not sound consistent with a kidney stone.  Pain worse with movement.  No signs of a UTI.  Patient given symptomatic treatment on repeat evaluation she reports feeling better.  I discussed with patient's husband who states that this is how she presented when she was seen by orthopedic previously and got better after the injections.  Therefore I do feel  that this is most likely related to either degenerative changes, bursitis, SI pain etc.  We discussed Tylenol, ibuprofen, course of steroids and she understands it could elevate her sugars slightly but would like to trial this.  Will give a short course of oxycodone and she can follow-up with orthopedics understands not to drive or work while on this.      FINAL CLINICAL IMPRESSION(S) / ED DIAGNOSES   Final diagnoses:  Right hip pain     Rx / DC Orders   ED Discharge Orders          Ordered    predniSONE (DELTASONE) 20 MG tablet  Daily with breakfast        02/06/23 1417    lidocaine (LIDODERM) 5 %  Every 12 hours        02/06/23 1417    oxyCODONE (ROXICODONE) 5 MG immediate release tablet  Every 6 hours PRN        02/06/23 1417             Note:  This document was prepared using Dragon voice recognition software and may include unintentional dictation errors.   Concha Se, MD 02/06/23 [phone]

## 2023-02-06 NOTE — Discharge Instructions (Addendum)
Please call your orthopedic doctor tomorrow to make a follow-up appointment for next week to further discuss whether or not they want to do any more imaging with MRI or trial more injections.  Return to the ER if develop weakness inability to ambulate or any other concerns.  You can take Tylenol 1 g every 8 hours, ibuprofen 600 every 6-8 hours as long as you are not taking any other naproxen or NSAID or just take naproxen with the prescribed medications.  Take oxycodone as prescribed. Do not drink alcohol, drive or participate in any other potentially dangerous activities while taking this medication as it may make you sleepy. Do not take this medication with any other sedating medications, either prescription or over-the-counter. If you were prescribed Percocet or Vicodin, do not take these with acetaminophen (Tylenol) as it is already contained within these medications.  This medication is an opiate (or narcotic) pain medication and can be habit forming. Use it as little as possible to achieve adequate pain control. Do not use or use it with extreme caution if you have a history of opiate abuse or dependence. If you are on a pain contract with your primary care doctor or a pain specialist, be sure to let them know you were prescribed this medication today from the Emergency Department. This medication is intended for your use only - do not give any to anyone else and keep it in a secure place where nobody else, especially children, have access to it.

## 2023-02-11 ENCOUNTER — Telehealth: Payer: Self-pay

## 2023-02-11 NOTE — Telephone Encounter (Signed)
noted 

## 2023-02-11 NOTE — Transitions of Care (Post Inpatient/ED Visit) (Cosign Needed)
I spoke with pt who only had one minute to talk; pt was seen [facility] ED on 02/06/23 with rt hip pain; no known injury and thinks could be arthritis.pt is aware how to take oxycodone and prednisone 40 mg given by ED. Pt is feeling better since seen at ED and BS are OK. Pt has contact office info if needs appt with PCP. Pt has appt with ortho in 2 wks. Sending note to Loann Quill FNP.        02/11/2023  Name: Danielle Browning MRN: [id number] DOB: [birth year]  Today's TOC FU Call Status: Today's TOC FU Call Status:: Successful TOC FU Call Competed TOC FU Call Complete Date: 02/11/23  Transition Care Management Follow-up Telephone Call Date of Discharge: 02/06/23 Discharge Facility: [facility]) Type of Discharge: Emergency Department Reason for ED Visit: Orthopedic Conditions (rt hip pain no known injury) How have you been since you were released from the hospital?: Better Any questions or concerns?: No  Items Reviewed: Did you receive and understand the discharge instructions provided?: Yes Medications obtained,verified, and reconciled?: Partial Review Completed (pt is aware how to take the oxycodone and prednisone prescribed at ED.) Reason for Partial Mediation Review: pt only had one minute to speak with me about ED FU. Any new allergies since your discharge?: No Dietary orders reviewed?: NA Do you have support at home?: Yes People in Home: spouse Name of Support/Comfort Primary Source: Molly Maduro  Medications Reviewed Today: Medications Reviewed Today     Reviewed by Allegra Grana, FNP (Family Nurse Practitioner) on 12/15/22 at [phone]  Med List Status: <None>   Medication Order Taking? Sig Documenting Provider Last Dose Status Informant  albuterol (VENTOLIN HFA) 108 (90 Base) MCG/ACT inhaler [id number] Yes Inhale 2 puffs into the lungs every 6 (six) hours as needed for wheezing or shortness of breath. Theadore Nan, NP Taking Active            Med  Note Tobey Bride, [facility] E   Tue Apr 07, 2021 10:55 AM) PRN  cetirizine (ZYRTEC) 10 MG tablet [id number] Yes Take 1 tablet (10 mg total) by mouth daily. Kerrie Buffalo M, NP Taking Active   Cholecalciferol (VITAMIN D3 PO) [id number] Yes Take 4,000 Int'l Units by mouth. [provider] Taking Active   Clotrimazole 1 % OINT [id number] Yes Apply 1 application topically 2 (two) times daily. Allegra Grana, FNP Taking Active   cyclobenzaprine (FLEXERIL) 10 MG tablet [id number] Yes Take 0.5-1 tablets (5-10 mg total) by mouth at bedtime as needed for muscle spasms. Allegra Grana, FNP Taking Active   diphenhydrAMINE (BENADRYL) spray [id number] No Apply topically every 4 (four) hours as needed for itching.  Patient not taking: Reported on 05/05/2022   Janne Napoleon, NP Not Taking Active            Med Note Laurann Montana May 17, 2021  3:21 PM) Not taking  DULoxetine (CYMBALTA) 60 MG capsule [id number] Yes TAKE 1 CAPSULE BY MOUTH EVERY DAY Allegra Grana, FNP Taking Active     Discontinued 08/29/19 1700   gabapentin (NEURONTIN) 300 MG capsule [id number] Yes Take 1 capsule (300 mg total) by mouth 3 (three) times daily. Allegra Grana, FNP Taking Active   meloxicam (MOBIC) 7.5 MG tablet [id number] Yes Take 1 tablet (7.5 mg total) by mouth 2 (two) times daily as needed for pain. McLean-Scocuzza, Pasty Spillers, MD Taking Active   metFORMIN (GLUCOPHAGE-XR) 500 MG 24 hr tablet [id number] Yes  Take 2 tablets (1,000 mg total) by mouth in the morning and at bedtime. Allegra Grana, FNP Taking Active   Misc Natural Products (TURMERIC CURCUMIN) CAPS [id number] Yes Take 3,000 capsules by mouth. [provider] Taking Active            Med Note Laurann Montana May 17, 2021  3:22 PM) Not taking  mometasone (ELOCON) 0.1 % cream [id number] Yes Apply 1 application. topically daily as needed (Rash). Qd up to 5 days a week to eczema on hands and left foot prn flares Deirdre Evener, MD Taking Active    mupirocin ointment (BACTROBAN) 2 % [id number] Yes Apply 1 application topically 2 (two) times daily. To lesions on abdomen. Allegra Grana, FNP Taking Active   Ruxolitinib Phosphate (OPZELURA) 1.5 % CREA [id number] Yes Apply to hands once a day Deirdre Evener, MD Taking Active   triamcinolone (KENALOG) 0.025 % cream [id number] Yes Apply 1 application topically 2 (two) times daily. Allegra Grana, FNP Taking Active             Home Care and Equipment/Supplies: Were Home Health Services Ordered?: NA Any new equipment or medical supplies ordered?: NA  Functional Questionnaire: Do you need assistance with bathing/showering or dressing?: No Do you need assistance with meal preparation?: No Do you need assistance with eating?: No Do you have difficulty maintaining continence: No Do you need assistance with getting out of bed/getting out of a chair/moving?: No Do you have difficulty managing or taking your medications?: No  Follow up appointments reviewed: PCP Follow-up appointment confirmed?: NA (pt said BS doing OK and pt has contact office info for PCP if appt is needed.) Specialist Hospital Follow-up appointment confirmed?: Yes (pt said BS doing OK and pt has contact office info for PCP if appt is needed.) Date of Specialist follow-up appointment?:  (pt said to see ortho in 2 wks; pt does not have appt date with her.) Follow-Up Specialty Provider:: ortho Do you need transportation to your follow-up appointment?: No Do you understand care options if your condition(s) worsen?: Yes-patient verbalized understanding    SIGNATURE Lewanda Rife, LPN

## 2023-04-11 ENCOUNTER — Other Ambulatory Visit: Payer: Self-pay | Admitting: Family

## 2023-04-11 DIAGNOSIS — Z6841 Body Mass Index (BMI) 40.0 and over, adult: Secondary | ICD-10-CM

## 2023-04-18 ENCOUNTER — Ambulatory Visit: Payer: Managed Care, Other (non HMO) | Admitting: Family

## 2023-05-31 ENCOUNTER — Ambulatory Visit: Payer: Managed Care, Other (non HMO) | Admitting: Dermatology

## 2023-05-31 ENCOUNTER — Encounter: Payer: Self-pay | Admitting: Dermatology

## 2023-05-31 DIAGNOSIS — L308 Other specified dermatitis: Secondary | ICD-10-CM | POA: Diagnosis not present

## 2023-05-31 DIAGNOSIS — L2081 Atopic neurodermatitis: Secondary | ICD-10-CM | POA: Diagnosis not present

## 2023-05-31 DIAGNOSIS — T148XXA Other injury of unspecified body region, initial encounter: Secondary | ICD-10-CM | POA: Diagnosis not present

## 2023-05-31 MED ORDER — CLOBETASOL PROPIONATE 0.05 % EX OINT: TOPICAL_OINTMENT | CUTANEOUS | 2 refills | Status: AC

## 2023-05-31 NOTE — Progress Notes (Signed)
   Follow Up Visit   Subjective  Danielle Browning is a 47 y.o. female who presents for the following: Patient c/o a extremely itchy rash on her feet for several months, treated with oral prednisone with a good response 1 month ago, using Tacrolimus ointment daily. Past meds include Triamcinolone cream, Opzelura cream. Pruritus is bad enough to prevent sleep  The following portions of the chart were reviewed this encounter and updated as appropriate: medications, allergies, medical history  Review of Systems:  No other skin or systemic complaints except as noted in HPI or Assessment and Plan.  Objective  Well appearing patient in no apparent distress; mood and affect are within normal limits.  A focused examination was performed of the following areas:face,hands,feet,chest,abdomen    Relevant exam findings are noted in the Assessment and Plan.    Assessment & Plan   Atopic neurodermatitis  Related Medications clobetasol ointment (TEMOVATE) 0.05 % Apply to affected skin qd-bid prn, Avoid applying to face, groin, and axilla. Use as directed. Long-term use can cause thinning of the skin.  Excoriation  Eczematous dermatitis, < 5% BSA but severely pruritic Exam: scaly slightly eroded plaques with vesicles on left interdigital and plantar foot >> right foot. Not active on hands today but has flared on hands in the past  Chronic and persistent condition with duration or expected duration over one year. Condition is symptomatic/ bothersome to patient. Not currently at goal.   Treatment Plan: Prior clearance on prednisone suggests condition is inflammatory and not infectious Start Clobetasol .05% ointment apply to affected skin qd-bid prn,Avoid applying to face, groin, and axilla. Use as directed. Long-term use can cause thinning of the skin.   Recommend starting Dupixent injection.   Patient going to see a allergist in 1 week, so she defers starting Dupixent injection today.  Plan on  starting Dupixent injection in 1 week here in the office with a nurse after she see her allergist.   Dupilumab (Dupixent) is a treatment given by injection for adults and children with moderate-to-severe atopic dermatitis. Goal is control of skin condition, not cure. It is given as 2 injections at the first dose followed by 1 injection ever 2 weeks thereafter.  Young children are dosed monthly.  Potential side effects include allergic reaction, injection site reactions and conjunctivitis (inflammation of the eyes).  The use of Dupixent requires long term medication management, including periodic office visits.   Return in about 9 days (around 06/09/2023) for a nurse visit to start Dupixent injection and 6 weeks eczema f/u .  IAngelique Holm, CMA, am acting as scribe for Elie Goody, MD .   Documentation: I have reviewed the above documentation for accuracy and completeness, and I agree with the above.  Elie Goody, MD

## 2023-05-31 NOTE — Patient Instructions (Addendum)

## 2023-06-03 ENCOUNTER — Ambulatory Visit (INDEPENDENT_AMBULATORY_CARE_PROVIDER_SITE_OTHER): Payer: Managed Care, Other (non HMO) | Admitting: Nurse Practitioner

## 2023-06-03 ENCOUNTER — Encounter: Payer: Managed Care, Other (non HMO) | Admitting: Family

## 2023-06-03 ENCOUNTER — Encounter: Payer: Self-pay | Admitting: Nurse Practitioner

## 2023-06-03 VITALS — BP 128/78 | HR 83 | Temp 98.2°F | Ht 61.0 in | Wt 301.6 lb

## 2023-06-03 DIAGNOSIS — R7303 Prediabetes: Secondary | ICD-10-CM | POA: Diagnosis not present

## 2023-06-03 DIAGNOSIS — Z0000 Encounter for general adult medical examination without abnormal findings: Secondary | ICD-10-CM | POA: Diagnosis not present

## 2023-06-03 DIAGNOSIS — Z8639 Personal history of other endocrine, nutritional and metabolic disease: Secondary | ICD-10-CM

## 2023-06-03 LAB — COMPREHENSIVE METABOLIC PANEL
ALT: 15 U/L (ref 0–35)
AST: 16 U/L (ref 0–37)
Albumin: 3.7 g/dL (ref 3.5–5.2)
Alkaline Phosphatase: 75 U/L (ref 39–117)
BUN: 16 mg/dL (ref 6–23)
CO2: 27 meq/L (ref 19–32)
Calcium: 8.7 mg/dL (ref 8.4–10.5)
Chloride: 104 meq/L (ref 96–112)
Creatinine, Ser: 0.6 mg/dL (ref 0.40–1.20)
GFR: 105.35 mL/min (ref >60.00)
Glucose, Bld: 125 mg/dL — ABNORMAL HIGH (ref 70–99)
Potassium: 3.8 meq/L (ref 3.5–5.1)
Sodium: 140 meq/L (ref 135–145)
Total Bilirubin: 0.5 mg/dL (ref 0.2–1.2)
Total Protein: 6.7 g/dL (ref 6.0–8.3)

## 2023-06-03 LAB — CBC WITH DIFFERENTIAL/PLATELET
Basophils Absolute: 0.1 10*3/uL (ref 0.0–0.1)
Basophils Relative: 0.9 % (ref 0.0–3.0)
Eosinophils Absolute: 0.2 10*3/uL (ref 0.0–0.7)
Eosinophils Relative: 2.7 % (ref 0.0–5.0)
HCT: 38.7 % (ref 36.0–46.0)
Hemoglobin: 12.8 g/dL (ref 12.0–15.0)
Lymphocytes Relative: 24.8 % (ref 12.0–46.0)
Lymphs Abs: 2.2 10*3/uL (ref 0.7–4.0)
MCHC: 33.1 g/dL (ref 30.0–36.0)
MCV: 88.8 fL (ref 78.0–100.0)
Monocytes Absolute: 0.4 10*3/uL (ref 0.1–1.0)
Monocytes Relative: 4.7 % (ref 3.0–12.0)
Neutro Abs: 6 10*3/uL (ref 1.4–7.7)
Neutrophils Relative %: 66.9 % (ref 43.0–77.0)
Platelets: 343 10*3/uL (ref 150.0–400.0)
RBC: 4.36 Mil/uL (ref 3.87–5.11)
RDW: 13.6 % (ref 11.5–15.5)
WBC: 9 10*3/uL (ref 4.0–10.5)

## 2023-06-03 LAB — VITAMIN D 25 HYDROXY (VIT D DEFICIENCY, FRACTURES): VITD: 29.77 ng/mL — ABNORMAL LOW (ref 30.00–100.00)

## 2023-06-03 LAB — LIPID PANEL
Cholesterol: 194 mg/dL (ref 0–200)
HDL: 46.8 mg/dL (ref >39.00)
LDL Cholesterol: 124 mg/dL — ABNORMAL HIGH (ref 0–99)
NonHDL: 146.87
Total CHOL/HDL Ratio: 4
Triglycerides: 116 mg/dL (ref 0.0–149.0)
VLDL: 23.2 mg/dL (ref 0.0–40.0)

## 2023-06-03 LAB — TSH: TSH: 1.95 u[IU]/mL (ref 0.35–5.50)

## 2023-06-03 LAB — HEMOGLOBIN A1C: Hgb A1c MFr Bld: 6.1 % (ref 4.6–6.5)

## 2023-06-03 NOTE — Patient Instructions (Signed)
Preventive Care 40-49 Years Old, Female Preventive care refers to lifestyle choices and visits with your health care provider that can promote health and wellness. Preventive care visits are also called wellness exams. What can I expect for my preventive care visit? Counseling Your health care provider may ask you questions about your: Medical history, including: Past medical problems. Family medical history. Pregnancy history. Current health, including: Menstrual cycle. Method of birth control. Emotional well-being. Home life and relationship well-being. Sexual activity and sexual health. Lifestyle, including: Alcohol, nicotine or tobacco, and drug use. Access to firearms. Diet, exercise, and sleep habits. Work and work environment. Sunscreen use. Safety issues such as seatbelt and bike helmet use. Physical exam Your health care provider will check your: Height and weight. These may be used to calculate your BMI (body mass index). BMI is a measurement that tells if you are at a healthy weight. Waist circumference. This measures the distance around your waistline. This measurement also tells if you are at a healthy weight and may help predict your risk of certain diseases, such as type 2 diabetes and high blood pressure. Heart rate and blood pressure. Body temperature. Skin for abnormal spots. What immunizations do I need?  Vaccines are usually given at various ages, according to a schedule. Your health care provider will recommend vaccines for you based on your age, medical history, and lifestyle or other factors, such as travel or where you work. What tests do I need? Screening Your health care provider may recommend screening tests for certain conditions. This may include: Lipid and cholesterol levels. Diabetes screening. This is done by checking your blood sugar (glucose) after you have not eaten for a while (fasting). Pelvic exam and Pap test. Hepatitis B test. Hepatitis C  test. HIV (human immunodeficiency virus) test. STI (sexually transmitted infection) testing, if you are at risk. Lung cancer screening. Colorectal cancer screening. Mammogram. Talk with your health care provider about when you should start having regular mammograms. This may depend on whether you have a family history of breast cancer. BRCA-related cancer screening. This may be done if you have a family history of breast, ovarian, tubal, or peritoneal cancers. Bone density scan. This is done to screen for osteoporosis. Talk with your health care provider about your test results, treatment options, and if necessary, the need for more tests. Follow these instructions at home: Eating and drinking  Eat a diet that includes fresh fruits and vegetables, whole grains, lean protein, and low-fat dairy products. Take vitamin and mineral supplements as recommended by your health care provider. Do not drink alcohol if: Your health care provider tells you not to drink. You are pregnant, may be pregnant, or are planning to become pregnant. If you drink alcohol: Limit how much you have to 0-1 drink a day. Know how much alcohol is in your drink. In the [place] one drink equals one 12 oz bottle of beer (355 mL), one 5 oz glass of wine (148 mL), or one 1 oz glass of hard liquor (44 mL). Lifestyle Brush your teeth every morning and night with fluoride toothpaste. Floss one time each day. Exercise for at least 30 minutes 5 or more days each week. Do not use any products that contain nicotine or tobacco. These products include cigarettes, chewing tobacco, and vaping devices, such as e-cigarettes. If you need help quitting, ask your health care provider. Do not use drugs. If you are sexually active, practice safe sex. Use a condom or other form of protection to   prevent STIs. If you do not wish to become pregnant, use a form of birth control. If you plan to become pregnant, see your health care provider for a  prepregnancy visit. Take aspirin only as told by your health care provider. Make sure that you understand how much to take and what form to take. Work with your health care provider to find out whether it is safe and beneficial for you to take aspirin daily. Find healthy ways to manage stress, such as: Meditation, yoga, or listening to music. Journaling. Talking to a trusted person. Spending time with friends and family. Minimize exposure to UV radiation to reduce your risk of skin cancer. Safety Always wear your seat belt while driving or riding in a vehicle. Do not drive: If you have been drinking alcohol. Do not ride with someone who has been drinking. When you are tired or distracted. While texting. If you have been using any mind-altering substances or drugs. Wear a helmet and other protective equipment during sports activities. If you have firearms in your house, make sure you follow all gun safety procedures. Seek help if you have been physically or sexually abused. What's next? Visit your health care provider once a year for an annual wellness visit. Ask your health care provider how often you should have your eyes and teeth checked. Stay up to date on all vaccines. This information is not intended to replace advice given to you by your health care provider. Make sure you discuss any questions you have with your health care provider. Document Revised: 02/18/2021 Document Reviewed: 02/18/2021 Elsevier Patient Education  2024 Elsevier Inc.  
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
## 2023-06-09 ENCOUNTER — Ambulatory Visit: Payer: Managed Care, Other (non HMO)

## 2023-06-09 DIAGNOSIS — L209 Atopic dermatitis, unspecified: Secondary | ICD-10-CM

## 2023-06-09 MED ORDER — DUPILUMAB 300 MG/2ML ~~LOC~~ SOSY
600.0000 mg | PREFILLED_SYRINGE | Freq: Once | SUBCUTANEOUS | Status: AC
  Administered 2023-06-09: 600 mg via SUBCUTANEOUS

## 2023-06-09 NOTE — Progress Notes (Signed)
Patient here today for loading dose of Dupixent for Severe Atopic Dermatitis.   Dupixent 300mg  x2 injected into patients Left and Right Upper Arm. Patient tolerated procedure well.   ZOX:[id number] Exp:08/05/2025  Evorn Gong RMA

## 2023-06-13 NOTE — Telephone Encounter (Signed)
Pt called stating she would like the biometric form faxed to her because she can not come pick it up [phone]

## 2023-06-14 NOTE — Telephone Encounter (Signed)
Patient just called and was asking about her biometric screening being faxed over again. I did tell her it can take up to 3 to five business days. The fax number is [phone].

## 2023-06-14 NOTE — Telephone Encounter (Signed)
See previous note paperwork has been faxed per Daijah

## 2023-06-21 NOTE — Telephone Encounter (Signed)
Spoke with pt. Pt received fax. Will keep a copy for our records as well.

## 2023-06-21 NOTE — Telephone Encounter (Signed)
Patient would like Bio screening form faxed to [phone]. She states she still has not received form.

## 2023-06-21 NOTE — Telephone Encounter (Signed)
Spoke with pt. Will be re-faxing a different form to pt

## 2023-06-23 ENCOUNTER — Ambulatory Visit: Payer: Managed Care, Other (non HMO)

## 2023-06-23 DIAGNOSIS — L209 Atopic dermatitis, unspecified: Secondary | ICD-10-CM | POA: Diagnosis not present

## 2023-06-23 MED ORDER — DUPILUMAB 300 MG/2ML ~~LOC~~ SOSY
300.0000 mg | PREFILLED_SYRINGE | Freq: Once | SUBCUTANEOUS | Status: AC
  Administered 2023-06-23: 300 mg via SUBCUTANEOUS

## 2023-06-23 NOTE — Progress Notes (Signed)
Patient here today for Dupixent injection for Severe Atopic Dermatitis.    Dupixent 300mg /39mL injected into patients Left Upper Arm. Patient tolerated procedure well.    Lot: [id number] Exp: 04/2025 BJY:[id number]   [name], RMA

## 2023-06-29 ENCOUNTER — Encounter: Payer: Self-pay | Admitting: Family

## 2023-06-29 ENCOUNTER — Ambulatory Visit: Payer: Managed Care, Other (non HMO) | Admitting: Family

## 2023-06-29 VITALS — BP 130/76 | HR 83 | Temp 97.8°F | Ht 61.0 in | Wt 305.0 lb

## 2023-06-29 DIAGNOSIS — R162 Hepatomegaly with splenomegaly, not elsewhere classified: Secondary | ICD-10-CM | POA: Diagnosis not present

## 2023-06-29 DIAGNOSIS — Z1231 Encounter for screening mammogram for malignant neoplasm of breast: Secondary | ICD-10-CM

## 2023-06-29 DIAGNOSIS — M545 Low back pain, unspecified: Secondary | ICD-10-CM

## 2023-06-29 DIAGNOSIS — Z6841 Body Mass Index (BMI) 40.0 and over, adult: Secondary | ICD-10-CM

## 2023-06-29 DIAGNOSIS — M2550 Pain in unspecified joint: Secondary | ICD-10-CM

## 2023-06-29 DIAGNOSIS — G8929 Other chronic pain: Secondary | ICD-10-CM

## 2023-06-29 LAB — COMPREHENSIVE METABOLIC PANEL
ALT: 16 U/L (ref 0–35)
AST: 16 U/L (ref 0–37)
Albumin: 4 g/dL (ref 3.5–5.2)
Alkaline Phosphatase: 76 U/L (ref 39–117)
BUN: 12 mg/dL (ref 6–23)
CO2: 25 meq/L (ref 19–32)
Calcium: 9 mg/dL (ref 8.4–10.5)
Chloride: 102 meq/L (ref 96–112)
Creatinine, Ser: 0.51 mg/dL (ref 0.40–1.20)
GFR: 109.5 mL/min (ref >60.00)
Glucose, Bld: 115 mg/dL — ABNORMAL HIGH (ref 70–99)
Potassium: 4.1 meq/L (ref 3.5–5.1)
Sodium: 135 meq/L (ref 135–145)
Total Bilirubin: 0.5 mg/dL (ref 0.2–1.2)
Total Protein: 6.8 g/dL (ref 6.0–8.3)

## 2023-06-29 LAB — CBC WITH DIFFERENTIAL/PLATELET
Basophils Absolute: 0.1 10*3/uL (ref 0.0–0.1)
Basophils Relative: 0.8 % (ref 0.0–3.0)
Eosinophils Absolute: 0.3 10*3/uL (ref 0.0–0.7)
Eosinophils Relative: 3.3 % (ref 0.0–5.0)
HCT: 42 % (ref 36.0–46.0)
Hemoglobin: 13.6 g/dL (ref 12.0–15.0)
Lymphocytes Relative: 26 % (ref 12.0–46.0)
Lymphs Abs: 2.1 10*3/uL (ref 0.7–4.0)
MCHC: 32.3 g/dL (ref 30.0–36.0)
MCV: 88.3 fL (ref 78.0–100.0)
Monocytes Absolute: 0.3 10*3/uL (ref 0.1–1.0)
Monocytes Relative: 3.7 % (ref 3.0–12.0)
Neutro Abs: 5.3 10*3/uL (ref 1.4–7.7)
Neutrophils Relative %: 66.2 % (ref 43.0–77.0)
Platelets: 370 10*3/uL (ref 150.0–400.0)
RBC: 4.75 Mil/uL (ref 3.87–5.11)
RDW: 13.3 % (ref 11.5–15.5)
WBC: 7.9 10*3/uL (ref 4.0–10.5)

## 2023-06-29 LAB — URINALYSIS, ROUTINE W REFLEX MICROSCOPIC
Bilirubin Urine: NEGATIVE
Hgb urine dipstick: NEGATIVE
Leukocytes,Ua: NEGATIVE
Nitrite: NEGATIVE
RBC / HPF: NONE SEEN (ref 0–?)
Specific Gravity, Urine: 1.02 (ref 1.000–1.030)
Total Protein, Urine: NEGATIVE
Urine Glucose: NEGATIVE
Urobilinogen, UA: 0.2 (ref 0.0–1.0)
WBC, UA: NONE SEEN (ref 0–?)
pH: 6 (ref 5.0–8.0)

## 2023-06-29 LAB — B12 AND FOLATE PANEL
Folate: 16.2 ng/mL (ref >5.9)
Vitamin B-12: 291 pg/mL (ref 211–911)

## 2023-06-29 LAB — C-REACTIVE PROTEIN: CRP: 1.8 mg/dL (ref 0.5–20.0)

## 2023-06-29 LAB — CK: Total CK: 63 U/L (ref 7–177)

## 2023-06-29 LAB — SEDIMENTATION RATE: Sed Rate: 37 mm/h — ABNORMAL HIGH (ref 0–20)

## 2023-06-29 MED ORDER — BUPROPION HCL ER (XL) 150 MG PO TB24: 150.0000 mg | ORAL_TABLET | Freq: Every day | ORAL | 3 refills | Status: DC

## 2023-06-29 MED ORDER — GABAPENTIN 300 MG PO CAPS: 300.0000 mg | ORAL_CAPSULE | Freq: Three times a day (TID) | ORAL | 1 refills | Status: DC

## 2023-06-29 MED ORDER — DULOXETINE HCL 30 MG PO CPEP: 30.0000 mg | ORAL_CAPSULE | Freq: Every day | ORAL | 3 refills | Status: DC

## 2023-06-29 MED ORDER — TRAMADOL HCL 50 MG PO TABS: 50.0000 mg | ORAL_TABLET | Freq: Every day | ORAL | 0 refills | Status: AC | PRN

## 2023-06-29 NOTE — Progress Notes (Signed)
Assessment & Plan:  Arthralgia, unspecified joint Assessment & Plan: 03/16/22 CRP 2.2 No M protein ANA negative   05/06/23 Rheumatoid factor < 10  Reviewed previous lab workup and images. No specific etiology for diffuse joint pain. She did abruptly come off Cymbalta and Wellbutrin as well as gained weight; we discussed likely all exacerbating features. Of note, Dupilumab is a new medication and is associated with myalgias less than 2-3%.  Patient will resume Wellbutrin 150mg , Cymbalta 30mg . Continue gabapentin 300mg  TID.   Will work to titrate Cymbalta from 30 mg to 60 mg in a couple of weeks.  I did provide her with a short course of tramadol to use for severe pain.  I provided her meloxicam and Flexeril again to use for short courses.  We will reevaluate at follow-up  Orders: -     CK -     Urinalysis, Routine w reflex microscopic -     CBC with Differential/Platelet -     Comprehensive metabolic panel -     C-reactive protein -     Sedimentation rate -     B12 and Folate Panel -     Gabapentin; Take 1 capsule (300 mg total) by mouth 3 (three) times daily.  Dispense: 90 capsule; Refill: 1 -     traMADol HCl; Take 1 tablet (50 mg total) by mouth daily as needed for up to 5 days.  Dispense: 15 tablet; Refill: 0  Hepatosplenomegaly Assessment & Plan: Discussed patient being overdue for follow-up imaging with Dr. Cathie Hoops.  She will call Dr. Cathie Hoops to schedule.    BMI 50.0-59.9, adult (HCC) -     buPROPion HCl ER (XL); Take 1 tablet (150 mg total) by mouth daily.  Dispense: 90 tablet; Refill: 3  Chronic left-sided low back pain without sciatica -     DULoxetine HCl; Take 1 capsule (30 mg total) by mouth daily.  Dispense: 90 capsule; Refill: 3 -     Gabapentin; Take 1 capsule (300 mg total) by mouth 3 (three) times daily.  Dispense: 90 capsule; Refill: 1  Encounter for screening mammogram for malignant neoplasm of breast -     3D Screening Mammogram, Left and Right; Future     Return  precautions given.   Risks, benefits, and alternatives of the medications and treatment plan prescribed today were discussed, and patient expressed understanding.   Education regarding symptom management and diagnosis given to patient on AVS either electronically or printed.  Return in about 2 weeks (around 07/13/2023).  Rennie Plowman, FNP  Subjective:    Patient ID: Danielle Browning, female    DOB: 09-28-1975, 47 y.o.   MRN: [id number]  CC: Pranati Clerkin is a 47 y.o. female who presents today for an acute visit.    HPI: Complains of diffuse bodyaches  Joints hurts to pick up purse. Legs hurt to move to stand up. She can move 'once she gets going'. Describes as achey pain.   Ran out of cymbalta 30mg  one week ago.  She has also ran out of wellbutrin 150mg  for 4 weeks.   No fever, cough, joint swelling  She has tried mobic 15mg  without relief.     She is not taking statin.  She had been prednisone in august; she has seen Dr Nesika [place] Callas; plans to start allergy shots.   Metformin has not been helpful for food cravings.    Started on dupilumab for atopic dermatitis 2 weeks.   03/16/22 CRP 2.2. Sed 29 No M protein  ANA negative    05/06/23 Rheumatoid factor < 10  X-ray right hip 02/06/2023 without evidence of fracture or dislocation X-ray thoracic spine 01/23/2019 without significant bone abnormalities. X-ray right knee 01/23/2019 degenerative joint disease X-ray cervical spine 01/23/2019 with moderate degenerative disc disease. No h/o seizure.   No h/o narcotic abuse.    Allergies: Penicillins, Other, Fruit & vegetable daily [nutritional supplements], and Sulfa antibiotics Current Outpatient Medications on File Prior to Visit  Medication Sig Dispense Refill   albuterol (VENTOLIN HFA) 108 (90 Base) MCG/ACT inhaler Inhale 2 puffs into the lungs every 6 (six) hours as needed for wheezing or shortness of breath. 6.7 g 0   cetirizine (ZYRTEC) 10 MG tablet Take 1 tablet (10 mg  total) by mouth daily. 30 tablet 2   Cholecalciferol (VITAMIN D3 PO) Take 4,000 Int'l Units by mouth.     clobetasol ointment (TEMOVATE) 0.05 % Apply to affected skin qd-bid prn, Avoid applying to face, groin, and axilla. Use as directed. Long-term use can cause thinning of the skin. 30 g 2   Clotrimazole 1 % OINT Apply 1 application topically 2 (two) times daily. 56.7 g 1   diphenhydrAMINE (BENADRYL) spray Apply topically every 4 (four) hours as needed for itching. 60 mL 0   meloxicam (MOBIC) 7.5 MG tablet Take 1 tablet (7.5 mg total) by mouth 2 (two) times daily as needed for pain. 60 tablet 5   Misc Natural Products (TURMERIC CURCUMIN) CAPS Take 3,000 capsules by mouth.     mometasone (ELOCON) 0.1 % cream Apply 1 application. topically daily as needed (Rash). Qd up to 5 days a week to eczema on hands and left foot prn flares 45 g 3   mupirocin ointment (BACTROBAN) 2 % Apply 1 application topically 2 (two) times daily. To lesions on abdomen. 22 g 0   Ruxolitinib Phosphate (OPZELURA) 1.5 % CREA Apply to hands once a day 60 g 2   triamcinolone (KENALOG) 0.025 % cream Apply 1 application topically 2 (two) times daily. 80 g 2   [DISCONTINUED] fluticasone (FLONASE) 50 MCG/ACT nasal spray Place 2 sprays into both nostrils daily. 16 g 0   No current facility-administered medications on file prior to visit.    Review of Systems  Constitutional:  Negative for chills and fever.  Respiratory:  Negative for cough.   Cardiovascular:  Negative for chest pain and palpitations.  Gastrointestinal:  Negative for nausea and vomiting.  Musculoskeletal:  Positive for arthralgias.  Neurological:  Negative for numbness.      Objective:    BP 130/76   Pulse 83   Temp 97.8 F (36.6 C) (Oral)   Ht 5\' 1"  (1.549 m)   Wt (!) 305 lb (138.3 kg)   SpO2 96%   BMI 57.63 kg/m   BP Readings from Last 3 Encounters:  06/29/23 130/76  06/03/23 128/78  02/06/23 131/69   Wt Readings from Last 3 Encounters:   06/29/23 (!) 305 lb (138.3 kg)  06/03/23 (!) 301 lb 9.6 oz (136.8 kg)  02/06/23 295 lb 6.7 oz (134 kg)    Physical Exam Vitals reviewed.  Constitutional:      Appearance: She is well-developed.  Eyes:     Conjunctiva/sclera: Conjunctivae normal.  Cardiovascular:     Rate and Rhythm: Normal rate and regular rhythm.     Pulses: Normal pulses.     Heart sounds: Normal heart sounds.  Pulmonary:     Effort: Pulmonary effort is normal.     Breath sounds: Normal  breath sounds. No wheezing, rhonchi or rales.  Musculoskeletal:     Lumbar back: No swelling, edema, spasms, tenderness or bony tenderness. Normal range of motion.     Comments: Full range of motion with flexion, tension, lateral side bends. No bony tenderness. No pain, numbness, tingling elicited with single leg raise bilaterally.   Skin:    General: Skin is warm and dry.  Neurological:     Mental Status: She is alert.     Sensory: No sensory deficit.     Deep Tendon Reflexes:     Reflex Scores:      Patellar reflexes are 2+ on the right side and 2+ on the left side.    Comments: Sensation and strength intact bilateral lower extremities.  Psychiatric:        Speech: Speech normal.        Behavior: Behavior normal.        Thought Content: Thought content normal.

## 2023-06-29 NOTE — Assessment & Plan Note (Addendum)
03/16/22 CRP 2.2 No M protein ANA negative   05/06/23 Rheumatoid factor < 10  Reviewed previous lab workup and images. No specific etiology for diffuse joint pain. She did abruptly come off Cymbalta and Wellbutrin as well as gained weight; we discussed likely all exacerbating features. Of note, Dupilumab is a new medication and is associated with myalgias less than 2-3%.  Patient will resume Wellbutrin 150mg , Cymbalta 30mg . Continue gabapentin 300mg  TID.   Will work to titrate Cymbalta from 30 mg to 60 mg in a couple of weeks.  I did provide her with a short course of tramadol to use for severe pain.  I provided her meloxicam and Flexeril again to use for short courses.  We will reevaluate at follow-up

## 2023-06-29 NOTE — Patient Instructions (Addendum)
Please call  and schedule your 3D mammogram and /or bone density scan as we discussed.   [facility]  ( new location in 2023)  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 Cathie Hoops, please call [facility], [phone]   We will do a short course of meloxicam, Flexeril.  I did provide you with tramadol to use for acute severe pain.  Do not drive or operate heavy machinery while on muscle relaxant. Please do not drink alcohol. Only take this medication as needed for acute muscle spasm at bedtime. This medication make you feel drowsy so be very careful.  Stop taking if become too drowsy or somnolent as this puts you at risk for falls. Please contact our office with any questions.   A couple of points in regards to meloxicam ( Mobic) -  This medication is not intended for daily , long term use. It is a potent anti inflammatory ( NSAID), and my intention is for you take as needed for moderate to severe pain. If you find yourself using daily, please let me know.   Please takes Mobic ( meloxicam) with FOOD since it is an anti-inflammatory as it can cause a GI bleed or ulcer. If you have a history of GI bleed or ulcer, please do NOT take.  Do no take over the counter aleve, motrin, advil, goody's powder for pain as they are also NSAIDs, and they are  in the same class as Mobic  Lastly, we will need to monitor kidney function while on Mobic, and if we were to see any decline in kidney function in the future, we would have to discontinue this medication.

## 2023-06-30 ENCOUNTER — Encounter: Payer: Self-pay | Admitting: Family

## 2023-06-30 ENCOUNTER — Other Ambulatory Visit: Payer: Self-pay | Admitting: Family

## 2023-06-30 DIAGNOSIS — R899 Unspecified abnormal finding in specimens from other organs, systems and tissues: Secondary | ICD-10-CM

## 2023-07-01 ENCOUNTER — Encounter: Payer: Self-pay | Admitting: Family

## 2023-07-01 DIAGNOSIS — G8929 Other chronic pain: Secondary | ICD-10-CM

## 2023-07-01 DIAGNOSIS — M5416 Radiculopathy, lumbar region: Secondary | ICD-10-CM

## 2023-07-01 DIAGNOSIS — M47816 Spondylosis without myelopathy or radiculopathy, lumbar region: Secondary | ICD-10-CM

## 2023-07-04 MED ORDER — MELOXICAM 15 MG PO TABS: 15.0000 mg | ORAL_TABLET | Freq: Every day | ORAL | 0 refills | Status: DC | PRN

## 2023-07-04 MED ORDER — CYCLOBENZAPRINE HCL 10 MG PO TABS: 10.0000 mg | ORAL_TABLET | Freq: Three times a day (TID) | ORAL | 1 refills | Status: DC | PRN

## 2023-07-04 NOTE — Addendum Note (Signed)
Addended by: Kristie Cowman on: 07/04/2023 01:58 PM   Modules accepted: Orders

## 2023-07-04 NOTE — Telephone Encounter (Signed)
Pt is wanting the 7.5 filled as well.

## 2023-07-05 NOTE — Assessment & Plan Note (Signed)
Discussed patient being overdue for follow-up imaging with Dr. Cathie Hoops.  She will call Dr. Cathie Hoops to schedule.

## 2023-07-07 ENCOUNTER — Ambulatory Visit: Payer: Managed Care, Other (non HMO)

## 2023-07-07 DIAGNOSIS — L209 Atopic dermatitis, unspecified: Secondary | ICD-10-CM | POA: Diagnosis not present

## 2023-07-07 MED ORDER — DUPILUMAB 300 MG/2ML ~~LOC~~ SOSY
300.0000 mg | PREFILLED_SYRINGE | Freq: Once | SUBCUTANEOUS | Status: AC
  Administered 2023-07-07: 300 mg via SUBCUTANEOUS

## 2023-07-07 NOTE — Progress Notes (Signed)
Patient here today for Dupixent injection for Severe Atopic Dermatitis.    Dupixent 300mg /52mL injected into patients right Upper Arm. Patient tolerated procedure well.    Lot: [id number] Exp: 08/2025 [id number]   [name], RMA

## 2023-07-12 ENCOUNTER — Ambulatory Visit: Payer: Managed Care, Other (non HMO) | Admitting: Dermatology

## 2023-07-18 ENCOUNTER — Ambulatory Visit: Payer: Managed Care, Other (non HMO) | Admitting: Family

## 2023-07-21 ENCOUNTER — Ambulatory Visit: Payer: Managed Care, Other (non HMO) | Admitting: Dermatology

## 2023-07-21 ENCOUNTER — Encounter: Payer: Self-pay | Admitting: Dermatology

## 2023-07-21 DIAGNOSIS — Z79899 Other long term (current) drug therapy: Secondary | ICD-10-CM | POA: Diagnosis not present

## 2023-07-21 DIAGNOSIS — Z7189 Other specified counseling: Secondary | ICD-10-CM

## 2023-07-21 DIAGNOSIS — L2081 Atopic neurodermatitis: Secondary | ICD-10-CM

## 2023-07-21 DIAGNOSIS — L209 Atopic dermatitis, unspecified: Secondary | ICD-10-CM

## 2023-07-21 MED ORDER — DUPILUMAB 300 MG/2ML ~~LOC~~ SOSY
300.0000 mg | PREFILLED_SYRINGE | SUBCUTANEOUS | Status: AC
  Administered 2023-07-21 – 2023-08-03 (×2): 300 mg via SUBCUTANEOUS

## 2023-07-21 MED ORDER — DUPIXENT 300 MG/2ML ~~LOC~~ SOAJ: 300.0000 mg | SUBCUTANEOUS | 11 refills | Status: DC

## 2023-07-21 NOTE — Patient Instructions (Signed)

## 2023-07-21 NOTE — Progress Notes (Signed)
Follow-Up Visit   Subjective  Danielle Browning is a 47 y.o. female who presents for the following: Atopic Dermatitis Dupixent 300mg /68ml sq injections q 2 wks, clobetasol oint qd TMC 0.1% cr qd to every other day, pt does have some dry itchy eyes but had some of it before starting Dupixent The patient has spots, moles and lesions to be evaluated, some may be new or changing and the patient may have concern these could be cancer.   The following portions of the chart were reviewed this encounter and updated as appropriate: medications, allergies, medical history  Review of Systems:  No other skin or systemic complaints except as noted in HPI or Assessment and Plan.  Objective  Well appearing patient in no apparent distress; mood and affect are within normal limits.   A focused examination was performed of the following areas: Hands, feet  Relevant exam findings are noted in the Assessment and Plan.    Assessment & Plan   ATOPIC DERMATITIS Hands, feet  FAILED TACROLIMUS, CLOBETASOL Exam: R medial 2nd finger erythematous slightly scaly patch <1% BSA  Chronic and persistent condition with duration or expected duration over one year. Condition is improving with treatment but not currently at goal.   Atopic dermatitis - Severe, on Dupixent (biologic medication).  Atopic dermatitis (eczema) is a chronic, relapsing, pruritic condition that can significantly affect quality of life. It is often associated with allergic rhinitis and/or asthma and can require treatment with topical medications, phototherapy, or in severe cases biologic medications, which require long term medication management.    Treatment Plan: Cont Dupixent 300mg  /62ml sq injections q 2 wks Dupixent 300mg  sq injection today to L upper arm Lot [id number] exp 04/2025, sample given today, patient tolerated well Dupixent myway forms signed and faxed Cont Clobetasol oint qd up to 5d/wk aa eczema hands, feet prn flares avoid  f/g/a Cont TMC 0.1% cr qd up to 5d/wk aa eczema hands, feet prn flares, avoid f/g/a  Recommend gentle skin care.   Dupilumab (Dupixent) is a treatment given by injection for adults and children with moderate-to-severe atopic dermatitis. Goal is control of skin condition, not cure. It is given as 2 injections at the first dose followed by 1 injection ever 2 weeks thereafter.  Young children are dosed monthly.  Potential side effects include allergic reaction, herpes infections, injection site reactions and conjunctivitis (inflammation of the eyes).  The use of Dupixent requires long term medication management, including periodic office visits.  Topical steroids (such as triamcinolone, fluocinolone, fluocinonide, mometasone, clobetasol, halobetasol, betamethasone, hydrocortisone) can cause thinning and lightening of the skin if they are used for too long in the same area. Your physician has selected the right strength medicine for your problem and area affected on the body. Please use your medication only as directed by your physician to prevent side effects.    Atopic neurodermatitis  Related Medications clobetasol ointment (TEMOVATE) 0.05 % Apply to affected skin qd-bid prn, Avoid applying to face, groin, and axilla. Use as directed. Long-term use can cause thinning of the skin.  dupilumab (DUPIXENT) prefilled syringe 300 mg   Long-term use of high-risk medication  Counseling and coordination of care    Return in about 6 months (around 01/18/2024) for Atopic Derm with Dr. Katrinka Blazing, 2 wks with nurse for injection.  I, Ardis Rowan, RMA, am acting as scribe for Elie Goody, MD .   Documentation: I have reviewed the above documentation for accuracy and completeness, and I agree with the above.  Elie Goody, MD

## 2023-07-23 ENCOUNTER — Encounter: Payer: Self-pay | Admitting: Dermatology

## 2023-07-28 ENCOUNTER — Ambulatory Visit: Payer: Managed Care, Other (non HMO) | Admitting: Family

## 2023-07-28 ENCOUNTER — Encounter: Payer: Self-pay | Admitting: Family

## 2023-07-28 VITALS — BP 136/70 | HR 87 | Temp 98.7°F | Ht 61.0 in | Wt 307.6 lb

## 2023-07-28 DIAGNOSIS — G8929 Other chronic pain: Secondary | ICD-10-CM

## 2023-07-28 DIAGNOSIS — Z6841 Body Mass Index (BMI) 40.0 and over, adult: Secondary | ICD-10-CM

## 2023-07-28 DIAGNOSIS — M545 Low back pain, unspecified: Secondary | ICD-10-CM

## 2023-07-28 DIAGNOSIS — K219 Gastro-esophageal reflux disease without esophagitis: Secondary | ICD-10-CM | POA: Diagnosis not present

## 2023-07-28 MED ORDER — OMEPRAZOLE 20 MG PO CPDR: 20.0000 mg | DELAYED_RELEASE_CAPSULE | Freq: Every day | ORAL | 0 refills | Status: DC

## 2023-07-28 MED ORDER — BUPROPION HCL ER (XL) 150 MG PO TB24: 300.0000 mg | ORAL_TABLET | Freq: Every day | ORAL | 3 refills | Status: DC

## 2023-07-28 NOTE — Patient Instructions (Addendum)
Start wellbutrin 300mg  in the morning  Start over the counter priolec while you are prolonged dose of mobic to protect against gastritis.    I have ordered MRI hip and lumbar as well as placed a referral to [facility] physiatry, Dr. Yves Dill.   Let us know if you dont hear back within a week in regards to an appointment being scheduled.   So that you are aware, if you are [facility] MyChart user , please pay attention to your MyChart messages as you may receive a MyChart message with a phone number to call and schedule this test/appointment own your own from our referral coordinator. This is a new process so I do not want you to miss this message.  If you are not a MyChart user, you will receive a phone call.

## 2023-07-28 NOTE — Assessment & Plan Note (Signed)
We agreed to increase Wellbutrin to 300 mg daily.  Patient will let me know if she were to feel increased anxiety or irritability on increased dose

## 2023-07-28 NOTE — Progress Notes (Signed)
Assessment & Plan:  Gastroesophageal reflux disease, unspecified whether esophagitis present -     Omeprazole; Take 1 capsule (20 mg total) by mouth daily. Take 30 minutes to one hour before breakfast  Dispense: 90 capsule; Refill: 0  BMI 50.0-59.9, adult (HCC) -     buPROPion HCl ER (XL); Take 2 tablets (300 mg total) by mouth daily.  Dispense: 180 tablet; Refill: 3  Chronic left-sided low back pain without sciatica Assessment & Plan: Presentation consistent with right trochanteric bursitis.  Question if other hip etiology including labral tear, or worsening of known lumbar stenosis also playing a role.  Pending right hip MRI to further evaluate for mass, hip etiology.  Pending lumbar MRI.  Advise she may continue meloxicam 15 mg daily, Flexeril 10 mg daily, Cymbalta 30 mg daily.  Discussed risk of gastritis with NSAIDs and advised to start omeprazole 20 mg daily while she is on meloxicam.  No long-term relief from recent corticosteroid injection right hip.  Pending referral to physiatry.  Orders: -     MR HIP RIGHT W CONTRAST; Future -     MR LUMBAR SPINE WO CONTRAST; Future -     Ambulatory referral to Psychiatry  Morbid obesity [facility]) Assessment & Plan: We agreed to increase Wellbutrin to 300 mg daily.  Patient will let me know if she were to feel increased anxiety or irritability on increased dose      Return precautions given.   Risks, benefits, and alternatives of the medications and treatment plan prescribed today were discussed, and patient expressed understanding.   Education regarding symptom management and diagnosis given to patient on AVS either electronically or printed.  Return in about 3 months (around 10/28/2023).  Rennie Plowman, FNP  Subjective:    Patient ID: Maxine Glenn, female    DOB: [birth year], 47 y.o.   MRN: [id number]  CC: Ronnell Umphenour is a 47 y.o. female who presents today for follow up.   HPI: Complains of  right hip pain and 'knot' on right hip  which is causing pain. She has felt a 'lump' over right hip , buttocks area which has become more tender and with reduced sensation.      Pain is worse after sleeping on right side. She has 'shooting pain' down right lateral to right shin.    She has resumed wellbutrin 150mg  and interested in increasing for appetite suppression. She has tolerated medication without side effects.    Compliant with gabapentin 300mg   at bedtime, cymbalta 30mg  every day.  She has also used mobic 15mg  with some relief of severity of arthralgia. She is taking flexeril 10mg  at bedtime with some relief . Tramadol 50mg  prn not very effective for her.   No saddle anesthesia, urinary or fecal incontinence.    X-ray right hip 02/06/2023 without evidence of fracture or dislocation  Consult with Dr. Landry Mellow 01/12/2023 for right hip pain, greater trochanteric right hip Lumbar XR 01/29/22 There is some moderate-severe facet hypertrophic changes throughout the lower lumbar spine.  Steroid injection at that time.   Allergies: Penicillins, Other, Fruit & vegetable daily [nutritional supplements], and Sulfa antibiotics Current Outpatient Medications on File Prior to Visit  Medication Sig Dispense Refill   albuterol (VENTOLIN HFA) 108 (90 Base) MCG/ACT inhaler Inhale 2 puffs into the lungs every 6 (six) hours as needed for wheezing or shortness of breath. 6.7 g 0   cetirizine (ZYRTEC) 10 MG tablet Take 1 tablet (10 mg total) by mouth daily. 30 tablet 2  Cholecalciferol (VITAMIN D3 PO) Take 4,000 Int'l Units by mouth.     clobetasol ointment (TEMOVATE) 0.05 % Apply to affected skin qd-bid prn, Avoid applying to face, groin, and axilla. Use as directed. Long-term use can cause thinning of the skin. 30 g 2   Clotrimazole 1 % OINT Apply 1 application topically 2 (two) times daily. 56.7 g 1   cyclobenzaprine (FLEXERIL) 10 MG tablet Take 1 tablet (10 mg total) by mouth 3 (three) times daily as needed for muscle spasms. 30 tablet 1    diphenhydrAMINE (BENADRYL) spray Apply topically every 4 (four) hours as needed for itching. 60 mL 0   DULoxetine (CYMBALTA) 30 MG capsule Take 1 capsule (30 mg total) by mouth daily. 90 capsule 3   Dupilumab (DUPIXENT) 300 MG/2ML SOAJ Inject 300 mg into the skin every 14 (fourteen) days. Starting at day 15 for maintenance. 4 mL 11   gabapentin (NEURONTIN) 300 MG capsule Take 1 capsule (300 mg total) by mouth 3 (three) times daily. 90 capsule 1   meloxicam (MOBIC) 15 MG tablet Take 1 tablet (15 mg total) by mouth daily as needed for pain (with food). 30 tablet 0   meloxicam (MOBIC) 7.5 MG tablet Take 1 tablet (7.5 mg total) by mouth 2 (two) times daily as needed for pain. 60 tablet 5   Misc Natural Products (TURMERIC CURCUMIN) CAPS Take 3,000 capsules by mouth.     mometasone (ELOCON) 0.1 % cream Apply 1 application. topically daily as needed (Rash). Qd up to 5 days a week to eczema on hands and left foot prn flares 45 g 3   mupirocin ointment (BACTROBAN) 2 % Apply 1 application topically 2 (two) times daily. To lesions on abdomen. 22 g 0   Ruxolitinib Phosphate (OPZELURA) 1.5 % CREA Apply to hands once a day 60 g 2   triamcinolone (KENALOG) 0.025 % cream Apply 1 application topically 2 (two) times daily. 80 g 2   [DISCONTINUED] fluticasone (FLONASE) 50 MCG/ACT nasal spray Place 2 sprays into both nostrils daily. 16 g 0   Current Facility-Administered Medications on File Prior to Visit  Medication Dose Route Frequency Provider Last Rate Last Admin   dupilumab (DUPIXENT) prefilled syringe 300 mg  300 mg Subcutaneous Q14 Days Elie Goody, MD   300 mg at 07/21/23 1616    Review of Systems  Constitutional:  Negative for chills and fever.  Respiratory:  Negative for cough.   Cardiovascular:  Negative for chest pain and palpitations.  Gastrointestinal:  Negative for nausea and vomiting.  Musculoskeletal:  Positive for arthralgias.  Neurological:  Positive for numbness.      Objective:     BP 136/70   Pulse 87   Temp 98.7 F (37.1 C) (Oral)   Ht 5\' 1"  (1.549 m)   Wt (!) 307 lb 9.6 oz (139.5 kg)   SpO2 95%   BMI 58.12 kg/m  BP Readings from Last 3 Encounters:  07/28/23 136/70  06/29/23 130/76  06/03/23 128/78   Wt Readings from Last 3 Encounters:  07/28/23 (!) 307 lb 9.6 oz (139.5 kg)  06/29/23 (!) 305 lb (138.3 kg)  06/03/23 (!) 301 lb 9.6 oz (136.8 kg)    Physical Exam Vitals reviewed.  Constitutional:      Appearance: She is well-developed.  Eyes:     Conjunctiva/sclera: Conjunctivae normal.  Cardiovascular:     Rate and Rhythm: Normal rate and regular rhythm.     Pulses: Normal pulses.     Heart sounds: Normal  heart sounds.  Pulmonary:     Effort: Pulmonary effort is normal.     Breath sounds: Normal breath sounds. No wheezing, rhonchi or rales.  Musculoskeletal:     Lumbar back: No swelling, edema, spasms, tenderness or bony tenderness. Normal range of motion.       Legs:     Comments: Full range of motion with flexion, tension, lateral side bends. No bony tenderness. Right lateral greater trochanteric tenderness without circumscribed mass. When compared to left lateral hip, slightly more firm. Non fluctuant. No erythema, edema. Skin is intact.     Skin:    General: Skin is warm and dry.  Neurological:     Mental Status: She is alert.     Sensory: No sensory deficit.     Deep Tendon Reflexes:     Reflex Scores:      Patellar reflexes are 2+ on the right side and 2+ on the left side.    Comments: Sensation and strength intact bilateral lower extremities.  Psychiatric:        Speech: Speech normal.        Behavior: Behavior normal.        Thought Content: Thought content normal.

## 2023-07-28 NOTE — Assessment & Plan Note (Addendum)
Presentation consistent with right trochanteric bursitis.  Question if other hip etiology including labral tear, or worsening of known lumbar stenosis also playing a role.  Pending right hip MRI to further evaluate for mass, hip etiology.  Pending lumbar MRI.  Advise she may continue meloxicam 15 mg daily, Flexeril 10 mg daily, Cymbalta 30 mg daily.  Discussed risk of gastritis with NSAIDs and advised to start omeprazole 20 mg daily while she is on meloxicam.  No long-term relief from recent corticosteroid injection right hip.  Pending referral to physiatry.

## 2023-08-01 ENCOUNTER — Telehealth: Payer: Self-pay | Admitting: Family

## 2023-08-01 ENCOUNTER — Encounter: Payer: Self-pay | Admitting: Family

## 2023-08-01 ENCOUNTER — Other Ambulatory Visit: Payer: Self-pay | Admitting: Family

## 2023-08-01 DIAGNOSIS — M25551 Pain in right hip: Secondary | ICD-10-CM

## 2023-08-01 NOTE — Telephone Encounter (Signed)
Lft pt vm to call ofc to sch [facility]ri. thanks

## 2023-08-02 ENCOUNTER — Telehealth: Payer: Self-pay | Admitting: Family

## 2023-08-02 ENCOUNTER — Other Ambulatory Visit: Payer: Self-pay

## 2023-08-02 MED ORDER — DUPIXENT 300 MG/2ML ~~LOC~~ SOAJ: 300.0000 mg | SUBCUTANEOUS | 1 refills | Status: DC

## 2023-08-02 NOTE — Progress Notes (Signed)
Clarified prescription to [facility]. aw

## 2023-08-02 NOTE — Telephone Encounter (Signed)
Lft pt vm to call ofc t sch mri. thanks

## 2023-08-03 ENCOUNTER — Other Ambulatory Visit: Payer: Self-pay

## 2023-08-03 ENCOUNTER — Ambulatory Visit: Payer: Managed Care, Other (non HMO)

## 2023-08-03 ENCOUNTER — Telehealth: Payer: Self-pay | Admitting: Family

## 2023-08-03 DIAGNOSIS — L209 Atopic dermatitis, unspecified: Secondary | ICD-10-CM

## 2023-08-03 MED ORDER — DUPIXENT 300 MG/2ML ~~LOC~~ SOAJ: 300.0000 mg | SUBCUTANEOUS | 1 refills | Status: DC

## 2023-08-03 NOTE — Telephone Encounter (Signed)
Lorain Childes Rexene Alberts team,   Call [facility] Dr Renold Don nurse  I wanted to consult with patient in regards to increased right hip pain and proceeding MRI of the right hip.  Patient continues to have pain.  I had planned to order MRI of the hip and MRI lumbar .  Does he prefer to see patient or would he like for me to order MR hip ? Does he recommend MRI hip with contrast or without contrast?

## 2023-08-03 NOTE — Progress Notes (Signed)
Patient here today for Dupixent injection for Severe Atopic Dermatitis.    Dupixent 300mg /12mL injected into patients right Upper Arm. Patient tolerated procedure well.    Lot: [id number] Exp: 01/03/2025 [id number]   [name], RMA

## 2023-08-03 NOTE — Telephone Encounter (Signed)
Tried to call Dr. Bartholome Bill office they are currently closed for lunch no one was able to take my call.

## 2023-08-03 NOTE — Telephone Encounter (Signed)
Called and spoke with nikaren and she stated she will send a message back to Dr. Renold Don

## 2023-08-03 NOTE — Progress Notes (Signed)
Change of pharmacy request per insurance. aw

## 2023-08-08 NOTE — Telephone Encounter (Signed)
Rosie called from Dr. Orlando Penner office to state Dr. Renold Don states if Rennie Plowman, FNP, would like to order MRI, then he is ok with that, otherwise, he could see patient back in his office to order additional imaging.

## 2023-08-10 ENCOUNTER — Telehealth: Payer: Self-pay

## 2023-08-10 NOTE — Telephone Encounter (Signed)
Patient states she is returning a call from [facility].  I transferred call to Rasheedah.

## 2023-08-15 ENCOUNTER — Ambulatory Visit: Admission: RE | Admit: 2023-08-15 | Payer: Managed Care, Other (non HMO) | Source: Ambulatory Visit

## 2023-08-15 ENCOUNTER — Other Ambulatory Visit: Payer: Managed Care, Other (non HMO)

## 2023-08-16 ENCOUNTER — Telehealth: Payer: Self-pay | Admitting: Family

## 2023-08-16 NOTE — Telephone Encounter (Signed)
Call pt  Please advise patient that I was holding on scheduling MRIs until she is seen by Dr. Yves Dill office 08/19/23.     Please ask her if okay if we cancel appointment at [facility]; I would prefer to have physiatry order images that they feel are most relevant and for her to avoid multiple trips  If she is okay with this, please call [facility] and cancel

## 2023-08-16 NOTE — Telephone Encounter (Signed)
Spoke to Danielle Browning and she would just like to go ahead and do the MRI since it has been scheduled and if Dr Yves Dill wants to order something else then that is fine.Danielle Browning did reschedule her missed lab appt though

## 2023-08-16 NOTE — Telephone Encounter (Signed)
Appt with [facility] office 08/18/23  Will await consult

## 2023-08-16 NOTE — Telephone Encounter (Signed)
[facility] radiology called and states patient has an upcoming appt. They do not have the orders please fax to [phone]

## 2023-08-17 ENCOUNTER — Other Ambulatory Visit (INDEPENDENT_AMBULATORY_CARE_PROVIDER_SITE_OTHER): Payer: Managed Care, Other (non HMO)

## 2023-08-17 DIAGNOSIS — R899 Unspecified abnormal finding in specimens from other organs, systems and tissues: Secondary | ICD-10-CM | POA: Diagnosis not present

## 2023-08-17 NOTE — Telephone Encounter (Signed)
Radiology at [facility] called states patient has an appt tomorrow but has no order in system  Please fax to [phone]

## 2023-08-17 NOTE — Telephone Encounter (Signed)
Looks like mri lumbar canceled with [facility]  Not sure what is going on

## 2023-08-18 ENCOUNTER — Telehealth: Payer: Self-pay

## 2023-08-18 NOTE — Addendum Note (Signed)
Addended by: Jarvis Morgan D on: 08/18/2023 09:07 AM   Modules accepted: Orders

## 2023-08-18 NOTE — Telephone Encounter (Signed)
See previous note pt has been taken care of

## 2023-08-18 NOTE — Telephone Encounter (Signed)
Completed Danielle Browning confirmed today

## 2023-08-18 NOTE — Telephone Encounter (Signed)
Patient states she is in [facility] for her MRI.  Patient states the people there told her that she needs prior authorization.  Patient states her paperwork has a different office from the one where she is having the MRI done.  Patient states she is in [place] because her insurance company told her that this would be less expensive than having it done at [facility], so that's why they referred her to this office in [facility].  Patient states the people in the office where she is are calling her and asked me to hold.  Patient came back on the phone and said everything is good now.

## 2023-08-19 LAB — CELIAC DISEASE AB SCREEN W/RFX
Antigliadin Abs, IgA: 4 U (ref 0–19)
IgA/Immunoglobulin A, Serum: 396 mg/dL — ABNORMAL HIGH (ref 87–352)
Transglutaminase IgA: <2 U/mL (ref 0–3)

## 2023-08-22 LAB — INTRINSIC FACTOR ANTIBODIES: Intrinsic Factor: NEGATIVE

## 2023-08-22 LAB — HOMOCYSTEINE: Homocysteine: 8 umol/L (ref <10.4)

## 2023-08-22 LAB — METHYLMALONIC ACID, SERUM: Methylmalonic Acid, Quant: 188 nmol/L (ref 55–335)

## 2023-08-23 ENCOUNTER — Encounter: Payer: Self-pay | Admitting: Family

## 2023-08-24 ENCOUNTER — Ambulatory Visit
Admission: RE | Admit: 2023-08-24 | Discharge: 2023-08-24 | Disposition: A | Discharge status: Home / Self Care | Payer: Managed Care, Other (non HMO) | Source: Ambulatory Visit | Attending: Family | Admitting: Family

## 2023-08-24 ENCOUNTER — Encounter: Payer: Self-pay | Admitting: Family

## 2023-08-24 DIAGNOSIS — Z1231 Encounter for screening mammogram for malignant neoplasm of breast: Secondary | ICD-10-CM

## 2023-08-24 DIAGNOSIS — E538 Deficiency of other specified B group vitamins: Secondary | ICD-10-CM | POA: Insufficient documentation

## 2023-09-05 ENCOUNTER — Other Ambulatory Visit: Payer: Self-pay | Admitting: Family

## 2023-09-05 ENCOUNTER — Telehealth: Payer: Self-pay | Admitting: Family

## 2023-09-05 ENCOUNTER — Telehealth: Payer: Self-pay

## 2023-09-05 NOTE — Telephone Encounter (Signed)
Patient left a voicemail to return her call. I spoke with patient and she said that she hasn't heard from the pharmacy about Dupixent delivery. [employer] sent [place] a fax stating that they have been unable to contact the patient. I advised patient of this and gave her their phone number. Advised patient our office will open again on 09/08/23 and if she is having issues with delivery or cost to please let us know.

## 2023-09-05 NOTE — Telephone Encounter (Signed)
Spoke with psychiatry nurse in regards to MRI for patient Advised that she did not have hip MRI done; I had advised patient to allow orthopedic or physiatry to order

## 2023-09-13 ENCOUNTER — Other Ambulatory Visit: Payer: Self-pay | Admitting: Family Medicine

## 2023-09-13 DIAGNOSIS — M25551 Pain in right hip: Secondary | ICD-10-CM

## 2023-09-22 ENCOUNTER — Other Ambulatory Visit: Payer: Self-pay | Admitting: Family

## 2023-09-22 ENCOUNTER — Ambulatory Visit
Admission: RE | Admit: 2023-09-22 | Discharge: 2023-09-22 | Disposition: A | Discharge status: Home / Self Care | Payer: Managed Care, Other (non HMO) | Source: Ambulatory Visit | Attending: Family Medicine | Admitting: Family Medicine

## 2023-09-22 DIAGNOSIS — M2550 Pain in unspecified joint: Secondary | ICD-10-CM

## 2023-09-22 DIAGNOSIS — G8929 Other chronic pain: Secondary | ICD-10-CM

## 2023-09-22 DIAGNOSIS — M25551 Pain in right hip: Secondary | ICD-10-CM

## 2023-10-06 ENCOUNTER — Other Ambulatory Visit: Payer: Self-pay | Admitting: Family Medicine

## 2023-10-06 DIAGNOSIS — R222 Localized swelling, mass and lump, trunk: Secondary | ICD-10-CM

## 2023-10-11 ENCOUNTER — Ambulatory Visit: Admission: RE | Admit: 2023-10-11 | Payer: Managed Care, Other (non HMO) | Source: Ambulatory Visit

## 2023-10-26 ENCOUNTER — Other Ambulatory Visit: Payer: Self-pay | Admitting: Family

## 2023-10-26 DIAGNOSIS — K219 Gastro-esophageal reflux disease without esophagitis: Secondary | ICD-10-CM

## 2023-10-28 ENCOUNTER — Other Ambulatory Visit: Payer: Self-pay | Admitting: Dermatology

## 2023-10-31 ENCOUNTER — Ambulatory Visit
Admission: RE | Admit: 2023-10-31 | Discharge: 2023-10-31 | Disposition: A | Discharge status: Home / Self Care | Payer: Managed Care, Other (non HMO) | Source: Ambulatory Visit | Attending: Family Medicine | Admitting: Family Medicine

## 2023-10-31 ENCOUNTER — Other Ambulatory Visit: Payer: Self-pay | Admitting: Family

## 2023-10-31 DIAGNOSIS — R222 Localized swelling, mass and lump, trunk: Secondary | ICD-10-CM | POA: Insufficient documentation

## 2023-10-31 DIAGNOSIS — M545 Low back pain, unspecified: Secondary | ICD-10-CM

## 2023-12-23 ENCOUNTER — Other Ambulatory Visit: Payer: Self-pay | Admitting: Dermatology

## 2023-12-29 ENCOUNTER — Other Ambulatory Visit: Payer: Self-pay | Admitting: Podiatry

## 2023-12-29 DIAGNOSIS — M76822 Posterior tibial tendinitis, left leg: Secondary | ICD-10-CM

## 2024-01-03 ENCOUNTER — Other Ambulatory Visit: Payer: Self-pay | Admitting: Family

## 2024-01-11 ENCOUNTER — Ambulatory Visit
Admission: RE | Admit: 2024-01-11 | Discharge: 2024-01-11 | Disposition: A | Discharge status: Home / Self Care | Source: Ambulatory Visit | Attending: Podiatry | Admitting: Podiatry

## 2024-01-11 DIAGNOSIS — M76822 Posterior tibial tendinitis, left leg: Secondary | ICD-10-CM

## 2024-01-13 ENCOUNTER — Other Ambulatory Visit: Payer: Self-pay | Admitting: Dermatology

## 2024-01-26 ENCOUNTER — Ambulatory Visit: Payer: Managed Care, Other (non HMO) | Admitting: Dermatology

## 2024-01-28 ENCOUNTER — Other Ambulatory Visit: Payer: Self-pay | Admitting: Family

## 2024-01-28 DIAGNOSIS — K219 Gastro-esophageal reflux disease without esophagitis: Secondary | ICD-10-CM

## 2024-02-09 ENCOUNTER — Encounter: Payer: Self-pay | Admitting: Dermatology

## 2024-02-09 ENCOUNTER — Ambulatory Visit: Admitting: Dermatology

## 2024-02-09 DIAGNOSIS — L2081 Atopic neurodermatitis: Secondary | ICD-10-CM

## 2024-02-09 DIAGNOSIS — Z79899 Other long term (current) drug therapy: Secondary | ICD-10-CM

## 2024-02-09 DIAGNOSIS — Z7189 Other specified counseling: Secondary | ICD-10-CM

## 2024-02-09 DIAGNOSIS — L209 Atopic dermatitis, unspecified: Secondary | ICD-10-CM | POA: Diagnosis not present

## 2024-02-09 MED ORDER — DUPIXENT 300 MG/2ML ~~LOC~~ SOAJ: SUBCUTANEOUS | 11 refills | Status: AC

## 2024-02-09 NOTE — Progress Notes (Signed)
   Follow-Up Visit   Subjective  Danielle Browning is a 48 y.o. female who presents for the following: Atopic Dermatitis Hx of flares at hands and feet, has tried and failed tacrolimus and clobetasol . Currently on Dupixent  injections, clobetasol  ointment as needed for flares and triamcinolone .   Still reports that hands have flared and some at left foot that is dry and cracked.  Reports last flare in January   The following portions of the chart were reviewed this encounter and updated as appropriate: medications, allergies, medical history  Review of Systems:  No other skin or systemic complaints except as noted in HPI or Assessment and Plan.  Objective  Well appearing patient in no apparent distress; mood and affect are within normal limits.  Areas Examined: Hands and feet   Relevant physical exam findings are noted in the Assessment and Plan.    Assessment & Plan   ATOPIC NEURODERMATITIS   Related Medications clobetasol  ointment (TEMOVATE ) 0.05 % Apply to affected skin qd-bid prn, Avoid applying to face, groin, and axilla. Use as directed. Long-term use can cause thinning of the skin. Dupilumab  (DUPIXENT ) 300 MG/2ML SOAJ INJECT 300 MG UNDER THE SKIN EVERY 14 DAYS ATOPIC DERMATITIS, UNSPECIFIED TYPE   Related Medications mupirocin  ointment (BACTROBAN ) 2 % Apply 1 application topically 2 (two) times daily. To lesions on abdomen. triamcinolone  (KENALOG ) 0.025 % cream Apply 1 application topically 2 (two) times daily. LONG-TERM USE OF HIGH-RISK MEDICATION   COUNSELING AND COORDINATION OF CARE   MEDICATION MANAGEMENT     ATOPIC DERMATITIS, responsive to Dupixent  Failed tacrolimus opzelura  clobetasol  Exam: eroded tin plaques on thumbs and lateral fingers b/l, pink scaly plaques at left foot 4th toe medial side  < 1% BSA  Chronic and persistent condition with duration or expected duration over one year. Condition is symptomatic / bothersome to patient. Not to  goal.   Atopic dermatitis - Severe, on Dupixent  (biologic medication).  Atopic dermatitis (eczema) is a chronic, relapsing, pruritic condition that can significantly affect quality of life. It is often associated with allergic rhinitis and/or asthma and can require treatment with topical medications, phototherapy, or in severe cases biologic medications, which require long term medication management.    Treatment Plan:  Cont Dupixent  300mg  /82ml sq injections q 2 wks will send rx [facility]  Cont Clobetasol  oint qd up to 5d/wk aa eczema hands, feet prn flares avoid f/g/a patient will send MyChart photo of cream that helps with itching and needs refill  Potential side effects include allergic reaction, herpes infections, injection site reactions and conjunctivitis (inflammation of the eyes).  The use of Dupixent  requires long term medication management, including periodic office visits.    Long term medication management.  Patient is using long term (months to years) prescription medication  to control their dermatologic condition.  These medications require periodic monitoring to evaluate for efficacy and side effects and may require periodic laboratory monitoring.   Recommend gentle skin care.   Return in about 1 year (around 02/08/2025) for atopic derm follow up.  I, Randee Busing, CMA, am acting as scribe for Harris Liming, MD.   Documentation: I have reviewed the above documentation for accuracy and completeness, and I agree with the above.  Harris Liming, MD

## 2024-02-09 NOTE — Patient Instructions (Addendum)
 Send a Wellsite geologist and photo of the current topicals you are using and need refills  Dupilumab  (Dupixent ) is a treatment given by injection for adults and children with moderate-to-severe atopic dermatitis. Goal is control of skin condition, not cure. It is given as 2 injections at the first dose followed by 1 injection ever 2 weeks thereafter.  Young children are dosed monthly.  Potential side effects include allergic reaction, herpes infections, injection site reactions and conjunctivitis (inflammation of the eyes).  The use of Dupixent  requires long term medication management, including periodic office visits.     Gentle Skin Care Guide  1. Bathe no more than once a day.  2. Avoid bathing in hot water  3. Use a mild soap like Dove, Vanicream, Cetaphil, CeraVe. Can use Lever 2000 or Cetaphil antibacterial soap  4. Use soap only where you need it. On most days, use it under your arms, between your legs, and on your feet. Let the water rinse other areas unless visibly dirty.  5. When you get out of the bath/shower, use a towel to gently blot your skin dry, don't rub it.  6. While your skin is still a little damp, apply a moisturizing cream such as Vanicream, CeraVe, Cetaphil, Eucerin, Sarna lotion or plain Vaseline Jelly. For hands apply Neutrogena [place] Hand Cream or Excipial Hand Cream.  7. Reapply moisturizer any time you start to itch or feel dry.  8. Sometimes using free and clear laundry detergents can be helpful. Fabric softener sheets should be avoided. Downy Free & Gentle liquid, or any liquid fabric softener that is free of dyes and perfumes, it acceptable to use  9. If your doctor has given you prescription creams you may apply moisturizers over them       Due to recent changes in healthcare laws, you may see results of your pathology and/or laboratory studies on MyChart before the doctors have had a chance to review them. We understand that in some cases there may  be results that are confusing or concerning to you. Please understand that not all results are received at the same time and often the doctors may need to interpret multiple results in order to provide you with the best plan of care or course of treatment. Therefore, we ask that you please give us  2 business days to thoroughly review all your results before contacting the office for clarification. Should we see a critical lab result, you will be contacted sooner.   If You Need Anything After Your Visit  If you have any questions or concerns for your doctor, please call our main line at [phone] and press option 4 to reach your doctor's medical assistant. If no one answers, please leave a voicemail as directed and we will return your call as soon as possible. Messages left after 4 pm will be answered the following business day.   You may also send us  a message via MyChart. We typically respond to MyChart messages within 1-2 business days.  For prescription refills, please ask your pharmacy to contact our office. Our fax number is [phone].  If you have an urgent issue when the clinic is closed that cannot wait until the next business day, you can page your doctor at the number below.    Please note that while we do our best to be available for urgent issues outside of office hours, we are not available 24/7.   If you have an urgent issue and are unable to reach us ,  you may choose to seek medical care at your doctor's office, retail clinic, urgent care center, or emergency room.  If you have a medical emergency, please immediately call 911 or go to the emergency department.  Pager Numbers  - Dr. Bary Likes: [phone]  - Dr. Annette Barters: [phone]  - Dr. Felipe Horton: [phone]   In the event of inclement weather, please call our main line at [phone] for an update on the status of any delays or closures.  Dermatology Medication Tips: Please keep the boxes that topical medications  come in in order to help keep track of the instructions about where and how to use these. Pharmacies typically print the medication instructions only on the boxes and not directly on the medication tubes.   If your medication is too expensive, please contact our office at [phone] option 4 or send us  a message through MyChart.   We are unable to tell what your co-pay for medications will be in advance as this is different depending on your insurance coverage. However, we may be able to find a substitute medication at lower cost or fill out paperwork to get insurance to cover a needed medication.   If a prior authorization is required to get your medication covered by your insurance company, please allow us  1-2 business days to complete this process.  Drug prices often vary depending on where the prescription is filled and some pharmacies may offer cheaper prices.  The website www.goodrx.com contains coupons for medications through different pharmacies. The prices here do not account for what the cost may be with help from insurance (it may be cheaper with your insurance), but the website can give you the price if you did not use any insurance.  - You can print the associated coupon and take it with your prescription to the pharmacy.  - You may also stop by our office during regular business hours and pick up a GoodRx coupon card.  - If you need your prescription sent electronically to a different pharmacy, notify our office through [facility] or by phone at [phone] option 4.     Si Usted Necesita Algo Despus de Su Visita  Tambin puede enviarnos un mensaje a travs de Clinical cytogeneticist. Por lo general respondemos a los mensajes de MyChart en el transcurso de 1 a 2 das hbiles.  Para renovar recetas, por favor pida a su farmacia que se ponga en contacto con nuestra oficina. Franz Jacks de fax es [place] [phone].  Si tiene un asunto urgente cuando la clnica est cerrada y que no  puede esperar hasta el siguiente da hbil, puede llamar/localizar a su doctor(a) al nmero que aparece a continuacin.   Por favor, tenga en cuenta que aunque hacemos todo lo posible para estar disponibles para asuntos urgentes fuera del horario de [place], no estamos disponibles las 24 horas del da, los 7 [address]  [address] de la [place].   Si tiene un problema urgente y no puede comunicarse con nosotros, puede optar por buscar atencin mdica  en el consultorio de su doctor(a), en una clnica privada, en un centro de atencin urgente o en una sala de emergencias.  Si tiene Engineer, drilling, por favor llame inmediatamente al 911 o vaya a la sala de emergencias.  Nmeros de bper  - Dr. Bary Likes: [phone]  - Dra. Annette Barters: [phone]  - Dr. Felipe Horton: [phone]   En caso de inclemencias del tiempo, por favor llame a Lajuan Pila principal al [phone] para una actualizacin sobre el estado de cualquier  retraso o cierre.  Consejos para la medicacin en dermatologa: Por favor, guarde las cajas en las que vienen los medicamentos de uso tpico para ayudarle a seguir las instrucciones sobre dnde y cmo usarlos. Las farmacias generalmente imprimen las instrucciones del medicamento slo en las cajas y no directamente en los tubos del [place].   Si su medicamento es muy caro, por favor, pngase en contacto con Bettyjane Brunet llamando al [phone] y presione la opcin 4 o envenos un mensaje a travs de Clinical cytogeneticist.   No podemos decirle cul ser su copago por los medicamentos por adelantado ya que esto es diferente dependiendo de la cobertura de su seguro. Sin embargo, es posible que podamos encontrar un medicamento sustituto a Audiological scientist un formulario para que el seguro cubra el medicamento que se considera necesario.   Si se requiere una autorizacin previa para que su compaa de seguros [place] su medicamento, por favor permtanos de 1 a 2 das hbiles para completar este  proceso.  Los precios de los medicamentos varan con frecuencia dependiendo del Environmental consultant de dnde se surte la receta y alguna farmacias pueden ofrecer precios ms baratos.  El sitio web www.goodrx.com tiene cupones para medicamentos de Health and safety inspector. Los precios aqu no tienen en cuenta lo que podra costar con la ayuda del seguro (puede ser ms barato con su seguro), pero el sitio web puede darle el precio si no utiliz Tourist information centre manager.  - Puede imprimir el cupn correspondiente y llevarlo con su receta a la farmacia.  - Tambin puede pasar por nuestra oficina durante el horario de atencin regular y Education officer, museum una tarjeta de cupones de GoodRx.  - Si necesita que su receta se enve electrnicamente a una farmacia diferente, informe a nuestra oficina a travs de MyChart de [place] o por telfono llamando al [phone] y presione la opcin 4.

## 2024-03-04 ENCOUNTER — Other Ambulatory Visit: Payer: Self-pay | Admitting: Family

## 2024-04-08 ENCOUNTER — Other Ambulatory Visit: Payer: Self-pay | Admitting: Family

## 2024-04-08 DIAGNOSIS — M2550 Pain in unspecified joint: Secondary | ICD-10-CM

## 2024-04-08 DIAGNOSIS — G8929 Other chronic pain: Secondary | ICD-10-CM

## 2024-04-13 NOTE — Telephone Encounter (Signed)
 Spoke with PT just to confirm she is taking 3 x daily.

## 2024-04-13 NOTE — Telephone Encounter (Signed)
 Spoke to pt she does not need a refill right now

## 2024-05-12 ENCOUNTER — Other Ambulatory Visit: Payer: Self-pay | Admitting: Family

## 2024-05-15 ENCOUNTER — Other Ambulatory Visit (HOSPITAL_COMMUNITY): Payer: Self-pay

## 2024-05-15 ENCOUNTER — Telehealth (HOSPITAL_COMMUNITY): Payer: Self-pay | Admitting: Pharmacy Technician

## 2024-05-15 ENCOUNTER — Inpatient Hospital Stay
Admission: EM | Admit: 2024-05-15 | Discharge: 2024-05-22 | DRG: 871 | Disposition: A | Discharge status: Home / Self Care | Source: Ambulatory Visit | Attending: Internal Medicine | Admitting: Internal Medicine

## 2024-05-15 ENCOUNTER — Emergency Department

## 2024-05-15 ENCOUNTER — Ambulatory Visit: Admitting: Family

## 2024-05-15 ENCOUNTER — Other Ambulatory Visit: Payer: Self-pay

## 2024-05-15 ENCOUNTER — Encounter: Payer: Self-pay | Admitting: Family

## 2024-05-15 ENCOUNTER — Inpatient Hospital Stay

## 2024-05-15 VITALS — BP 130/78 | HR 94 | Temp 99.0°F | Ht 61.0 in | Wt 320.2 lb

## 2024-05-15 DIAGNOSIS — K219 Gastro-esophageal reflux disease without esophagitis: Secondary | ICD-10-CM | POA: Diagnosis present

## 2024-05-15 DIAGNOSIS — Z79899 Other long term (current) drug therapy: Secondary | ICD-10-CM

## 2024-05-15 DIAGNOSIS — Z9049 Acquired absence of other specified parts of digestive tract: Secondary | ICD-10-CM

## 2024-05-15 DIAGNOSIS — I2699 Other pulmonary embolism without acute cor pulmonale: Secondary | ICD-10-CM | POA: Diagnosis present

## 2024-05-15 DIAGNOSIS — G8929 Other chronic pain: Secondary | ICD-10-CM | POA: Diagnosis present

## 2024-05-15 DIAGNOSIS — K589 Irritable bowel syndrome without diarrhea: Secondary | ICD-10-CM | POA: Diagnosis present

## 2024-05-15 DIAGNOSIS — Z882 Allergy status to sulfonamides status: Secondary | ICD-10-CM

## 2024-05-15 DIAGNOSIS — M7989 Other specified soft tissue disorders: Secondary | ICD-10-CM

## 2024-05-15 DIAGNOSIS — Z6841 Body Mass Index (BMI) 40.0 and over, adult: Secondary | ICD-10-CM

## 2024-05-15 DIAGNOSIS — Z794 Long term (current) use of insulin: Secondary | ICD-10-CM | POA: Diagnosis not present

## 2024-05-15 DIAGNOSIS — Z823 Family history of stroke: Secondary | ICD-10-CM

## 2024-05-15 DIAGNOSIS — R0609 Other forms of dyspnea: Secondary | ICD-10-CM

## 2024-05-15 DIAGNOSIS — F331 Major depressive disorder, recurrent, moderate: Secondary | ICD-10-CM | POA: Diagnosis not present

## 2024-05-15 DIAGNOSIS — I2690 Septic pulmonary embolism without acute cor pulmonale: Secondary | ICD-10-CM | POA: Diagnosis not present

## 2024-05-15 DIAGNOSIS — L409 Psoriasis, unspecified: Secondary | ICD-10-CM | POA: Diagnosis present

## 2024-05-15 DIAGNOSIS — E66813 Obesity, class 3: Secondary | ICD-10-CM | POA: Diagnosis present

## 2024-05-15 DIAGNOSIS — G4733 Obstructive sleep apnea (adult) (pediatric): Secondary | ICD-10-CM | POA: Diagnosis present

## 2024-05-15 DIAGNOSIS — R0602 Shortness of breath: Secondary | ICD-10-CM | POA: Diagnosis not present

## 2024-05-15 DIAGNOSIS — R519 Headache, unspecified: Secondary | ICD-10-CM | POA: Diagnosis not present

## 2024-05-15 DIAGNOSIS — B001 Herpesviral vesicular dermatitis: Secondary | ICD-10-CM | POA: Diagnosis present

## 2024-05-15 DIAGNOSIS — A419 Sepsis, unspecified organism: Principal | ICD-10-CM | POA: Diagnosis present

## 2024-05-15 DIAGNOSIS — R051 Acute cough: Secondary | ICD-10-CM | POA: Diagnosis not present

## 2024-05-15 DIAGNOSIS — Z8669 Personal history of other diseases of the nervous system and sense organs: Secondary | ICD-10-CM

## 2024-05-15 DIAGNOSIS — Z800 Family history of malignant neoplasm of digestive organs: Secondary | ICD-10-CM

## 2024-05-15 DIAGNOSIS — L03116 Cellulitis of left lower limb: Principal | ICD-10-CM | POA: Diagnosis present

## 2024-05-15 DIAGNOSIS — L309 Dermatitis, unspecified: Secondary | ICD-10-CM | POA: Diagnosis not present

## 2024-05-15 DIAGNOSIS — F329 Major depressive disorder, single episode, unspecified: Secondary | ICD-10-CM | POA: Diagnosis not present

## 2024-05-15 DIAGNOSIS — G4730 Sleep apnea, unspecified: Secondary | ICD-10-CM | POA: Diagnosis present

## 2024-05-15 DIAGNOSIS — M79605 Pain in left leg: Secondary | ICD-10-CM | POA: Diagnosis present

## 2024-05-15 DIAGNOSIS — E0800 Diabetes mellitus due to underlying condition with hyperosmolarity without nonketotic hyperglycemic-hyperosmolar coma (NKHHC): Secondary | ICD-10-CM

## 2024-05-15 DIAGNOSIS — F321 Major depressive disorder, single episode, moderate: Secondary | ICD-10-CM | POA: Diagnosis present

## 2024-05-15 DIAGNOSIS — G441 Vascular headache, not elsewhere classified: Secondary | ICD-10-CM | POA: Diagnosis not present

## 2024-05-15 DIAGNOSIS — Z881 Allergy status to other antibiotic agents status: Secondary | ICD-10-CM

## 2024-05-15 DIAGNOSIS — Z791 Long term (current) use of non-steroidal anti-inflammatories (NSAID): Secondary | ICD-10-CM

## 2024-05-15 DIAGNOSIS — L301 Dyshidrosis [pompholyx]: Secondary | ICD-10-CM | POA: Diagnosis not present

## 2024-05-15 DIAGNOSIS — T7840XA Allergy, unspecified, initial encounter: Secondary | ICD-10-CM | POA: Diagnosis not present

## 2024-05-15 DIAGNOSIS — E559 Vitamin D deficiency, unspecified: Secondary | ICD-10-CM | POA: Diagnosis present

## 2024-05-15 DIAGNOSIS — R7303 Prediabetes: Secondary | ICD-10-CM

## 2024-05-15 DIAGNOSIS — E1165 Type 2 diabetes mellitus with hyperglycemia: Secondary | ICD-10-CM | POA: Diagnosis present

## 2024-05-15 DIAGNOSIS — F419 Anxiety disorder, unspecified: Secondary | ICD-10-CM | POA: Diagnosis present

## 2024-05-15 DIAGNOSIS — L0390 Cellulitis, unspecified: Secondary | ICD-10-CM

## 2024-05-15 DIAGNOSIS — Z808 Family history of malignant neoplasm of other organs or systems: Secondary | ICD-10-CM

## 2024-05-15 DIAGNOSIS — Z8249 Family history of ischemic heart disease and other diseases of the circulatory system: Secondary | ICD-10-CM

## 2024-05-15 DIAGNOSIS — K5900 Constipation, unspecified: Secondary | ICD-10-CM | POA: Diagnosis not present

## 2024-05-15 DIAGNOSIS — D6859 Other primary thrombophilia: Secondary | ICD-10-CM | POA: Diagnosis present

## 2024-05-15 DIAGNOSIS — Z8781 Personal history of (healed) traumatic fracture: Secondary | ICD-10-CM

## 2024-05-15 DIAGNOSIS — Z818 Family history of other mental and behavioral disorders: Secondary | ICD-10-CM

## 2024-05-15 DIAGNOSIS — Z83438 Family history of other disorder of lipoprotein metabolism and other lipidemia: Secondary | ICD-10-CM

## 2024-05-15 DIAGNOSIS — Z880 Allergy status to penicillin: Secondary | ICD-10-CM

## 2024-05-15 DIAGNOSIS — K5909 Other constipation: Secondary | ICD-10-CM | POA: Diagnosis not present

## 2024-05-15 DIAGNOSIS — Z86718 Personal history of other venous thrombosis and embolism: Secondary | ICD-10-CM

## 2024-05-15 HISTORY — DX: Obesity, unspecified: E66.9

## 2024-05-15 LAB — TROPONIN I (HIGH SENSITIVITY): Troponin I (High Sensitivity): 4 ng/L (ref <18)

## 2024-05-15 LAB — BASIC METABOLIC PANEL WITH GFR
Anion gap: 9 (ref 5–15)
BUN: 12 mg/dL (ref 6–20)
CO2: 26 mmol/L (ref 22–32)
Calcium: 8.4 mg/dL — ABNORMAL LOW (ref 8.9–10.3)
Chloride: 101 mmol/L (ref 98–111)
Creatinine, Ser: 0.68 mg/dL (ref 0.44–1.00)
GFR, Estimated: >60 mL/min (ref >60)
Glucose, Bld: 242 mg/dL — ABNORMAL HIGH (ref 70–99)
Potassium: 3.8 mmol/L (ref 3.5–5.1)
Sodium: 136 mmol/L (ref 135–145)

## 2024-05-15 LAB — CBC WITH DIFFERENTIAL/PLATELET
Abs Immature Granulocytes: 0.07 K/uL (ref 0.00–0.07)
Basophils Absolute: 0.1 K/uL (ref 0.0–0.1)
Basophils Relative: 1 %
Eosinophils Absolute: 0.1 K/uL (ref 0.0–0.5)
Eosinophils Relative: 1 %
HCT: 42.8 % (ref 36.0–46.0)
Hemoglobin: 14.4 g/dL (ref 12.0–15.0)
Immature Granulocytes: 1 %
Lymphocytes Relative: 11 %
Lymphs Abs: 1.4 K/uL (ref 0.7–4.0)
MCH: 28.6 pg (ref 26.0–34.0)
MCHC: 33.6 g/dL (ref 30.0–36.0)
MCV: 85.1 fL (ref 80.0–100.0)
Monocytes Absolute: 0.5 K/uL (ref 0.1–1.0)
Monocytes Relative: 4 %
Neutro Abs: 10.7 K/uL — ABNORMAL HIGH (ref 1.7–7.7)
Neutrophils Relative %: 82 %
Platelets: 283 K/uL (ref 150–400)
RBC: 5.03 MIL/uL (ref 3.87–5.11)
RDW: 12.8 % (ref 11.5–15.5)
WBC: 12.7 K/uL — ABNORMAL HIGH (ref 4.0–10.5)
nRBC: 0 % (ref 0.0–0.2)

## 2024-05-15 LAB — LACTIC ACID, PLASMA: Lactic Acid, Venous: 1 mmol/L (ref 0.5–1.9)

## 2024-05-15 LAB — POCT INFLUENZA A/B
Influenza A, POC: NEGATIVE
Influenza B, POC: NEGATIVE

## 2024-05-15 LAB — HCG, QUANTITATIVE, PREGNANCY: hCG, Beta Chain, Quant, S: 2 m[IU]/mL (ref <5)

## 2024-05-15 LAB — POC COVID19 BINAXNOW: SARS Coronavirus 2 Ag: NEGATIVE

## 2024-05-15 LAB — MRSA NEXT GEN BY PCR, NASAL: MRSA by PCR Next Gen: NOT DETECTED

## 2024-05-15 LAB — BRAIN NATRIURETIC PEPTIDE: B Natriuretic Peptide: 70 pg/mL (ref 0.0–100.0)

## 2024-05-15 MED ORDER — BUPROPION HCL ER (XL) 150 MG PO TB24
300.0000 mg | ORAL_TABLET | Freq: Every day | ORAL | Status: DC
  Administered 2024-05-15 – 2024-05-22 (×8): 300 mg via ORAL
  Filled 2024-05-15 (×8): qty 2

## 2024-05-15 MED ORDER — MOMETASONE FUROATE 0.1 % EX CREA: 1.0000 | TOPICAL_CREAM | Freq: Every day | CUTANEOUS | Status: DC | PRN

## 2024-05-15 MED ORDER — HYDROCODONE-ACETAMINOPHEN 5-325 MG PO TABS
1.0000 | ORAL_TABLET | ORAL | Status: DC | PRN
  Administered 2024-05-15: 2 via ORAL
  Administered 2024-05-15: 1 via ORAL
  Administered 2024-05-16 – 2024-05-17 (×6): 2 via ORAL
  Administered 2024-05-17: 1 via ORAL
  Administered 2024-05-17 – 2024-05-18 (×2): 2 via ORAL
  Filled 2024-05-15 (×9): qty 2
  Filled 2024-05-15: qty 1
  Filled 2024-05-15: qty 2

## 2024-05-15 MED ORDER — APIXABAN 5 MG PO TABS
10.0000 mg | ORAL_TABLET | Freq: Two times a day (BID) | ORAL | Status: AC
  Administered 2024-05-15 – 2024-05-21 (×13): 10 mg via ORAL
  Filled 2024-05-15 (×12): qty 2

## 2024-05-15 MED ORDER — ONDANSETRON HCL 4 MG PO TABS
4.0000 mg | ORAL_TABLET | Freq: Four times a day (QID) | ORAL | Status: DC | PRN
  Administered 2024-05-16: 4 mg via ORAL
  Filled 2024-05-15: qty 1

## 2024-05-15 MED ORDER — CLOBETASOL PROPIONATE 0.05 % EX OINT
TOPICAL_OINTMENT | Freq: Two times a day (BID) | CUTANEOUS | Status: DC | PRN
  Administered 2024-05-20: 1 via TOPICAL
  Filled 2024-05-15: qty 15

## 2024-05-15 MED ORDER — VANCOMYCIN HCL IN DEXTROSE 1-5 GM/200ML-% IV SOLN: 1000.0000 mg | Freq: Once | INTRAVENOUS | Status: DC

## 2024-05-15 MED ORDER — SODIUM CHLORIDE 0.9 % IV SOLN
100.0000 mg | Freq: Once | INTRAVENOUS | Status: AC
  Administered 2024-05-15: 100 mg via INTRAVENOUS
  Filled 2024-05-15: qty 100

## 2024-05-15 MED ORDER — VANCOMYCIN HCL IN DEXTROSE 1-5 GM/200ML-% IV SOLN
1000.0000 mg | Freq: Two times a day (BID) | INTRAVENOUS | Status: DC
Start: 2024-05-16 — End: 2024-05-17
  Administered 2024-05-16 – 2024-05-17 (×3): 1000 mg via INTRAVENOUS
  Filled 2024-05-15 (×4): qty 200

## 2024-05-15 MED ORDER — ACETAMINOPHEN 325 MG PO TABS: 650.0000 mg | ORAL_TABLET | Freq: Four times a day (QID) | ORAL | Status: DC | PRN

## 2024-05-15 MED ORDER — ALBUTEROL SULFATE (2.5 MG/3ML) 0.083% IN NEBU: 2.5000 mg | INHALATION_SOLUTION | Freq: Four times a day (QID) | RESPIRATORY_TRACT | Status: DC | PRN

## 2024-05-15 MED ORDER — CETIRIZINE HCL 10 MG PO TABS
10.0000 mg | ORAL_TABLET | Freq: Every evening | ORAL | Status: DC
  Administered 2024-05-15 – 2024-05-21 (×7): 10 mg via ORAL
  Filled 2024-05-15 (×8): qty 1

## 2024-05-15 MED ORDER — ALBUTEROL SULFATE HFA 108 (90 BASE) MCG/ACT IN AERS: 2.0000 | INHALATION_SPRAY | Freq: Four times a day (QID) | RESPIRATORY_TRACT | Status: DC | PRN

## 2024-05-15 MED ORDER — IOHEXOL 350 MG/ML SOLN
75.0000 mL | Freq: Once | INTRAVENOUS | Status: AC | PRN
  Administered 2024-05-15: 75 mL via INTRAVENOUS

## 2024-05-15 MED ORDER — GABAPENTIN 300 MG PO CAPS
300.0000 mg | ORAL_CAPSULE | Freq: Three times a day (TID) | ORAL | Status: DC
  Administered 2024-05-15 – 2024-05-22 (×21): 300 mg via ORAL
  Filled 2024-05-15 (×21): qty 1

## 2024-05-15 MED ORDER — MORPHINE SULFATE (PF) 2 MG/ML IV SOLN
2.0000 mg | Freq: Once | INTRAVENOUS | Status: AC
  Administered 2024-05-15: 2 mg via INTRAVENOUS
  Filled 2024-05-15: qty 1

## 2024-05-15 MED ORDER — SODIUM CHLORIDE 0.9 % IV SOLN: 2.0000 g | Freq: Once | INTRAVENOUS | Status: DC

## 2024-05-15 MED ORDER — DULOXETINE HCL 30 MG PO CPEP
30.0000 mg | ORAL_CAPSULE | Freq: Every day | ORAL | Status: DC
  Administered 2024-05-15 – 2024-05-19 (×5): 30 mg via ORAL
  Filled 2024-05-15 (×5): qty 1

## 2024-05-15 MED ORDER — LORATADINE 10 MG PO TABS
10.0000 mg | ORAL_TABLET | Freq: Every day | ORAL | Status: DC
Start: 2024-05-15 — End: 2024-05-15

## 2024-05-15 MED ORDER — ACETAMINOPHEN 650 MG RE SUPP: 650.0000 mg | Freq: Four times a day (QID) | RECTAL | Status: DC | PRN

## 2024-05-15 MED ORDER — VANCOMYCIN HCL 2000 MG/400ML IV SOLN
2000.0000 mg | Freq: Once | INTRAVENOUS | Status: AC
  Administered 2024-05-15: 2000 mg via INTRAVENOUS
  Filled 2024-05-15: qty 400

## 2024-05-15 MED ORDER — APIXABAN 5 MG PO TABS
5.0000 mg | ORAL_TABLET | Freq: Two times a day (BID) | ORAL | Status: DC
  Administered 2024-05-22: 5 mg via ORAL
  Filled 2024-05-15: qty 1

## 2024-05-15 MED ORDER — SACCHAROMYCES BOULARDII 250 MG PO CAPS
250.0000 mg | ORAL_CAPSULE | Freq: Two times a day (BID) | ORAL | Status: DC
  Administered 2024-05-15 – 2024-05-22 (×15): 250 mg via ORAL
  Filled 2024-05-15 (×15): qty 1

## 2024-05-15 MED ORDER — SENNOSIDES-DOCUSATE SODIUM 8.6-50 MG PO TABS
1.0000 | ORAL_TABLET | Freq: Every evening | ORAL | Status: DC | PRN
  Administered 2024-05-17 – 2024-05-21 (×3): 1 via ORAL
  Filled 2024-05-15 (×3): qty 1

## 2024-05-15 MED ORDER — MORPHINE SULFATE (PF) 2 MG/ML IV SOLN
2.0000 mg | INTRAVENOUS | Status: DC | PRN
  Administered 2024-05-15 – 2024-05-17 (×10): 2 mg via INTRAVENOUS
  Filled 2024-05-15 (×11): qty 1

## 2024-05-15 MED ORDER — ONDANSETRON HCL 4 MG/2ML IJ SOLN
4.0000 mg | Freq: Four times a day (QID) | INTRAMUSCULAR | Status: DC | PRN
  Administered 2024-05-17: 4 mg via INTRAVENOUS
  Filled 2024-05-15: qty 2

## 2024-05-15 MED ORDER — OXYCODONE HCL 5 MG PO TABS
5.0000 mg | ORAL_TABLET | Freq: Once | ORAL | Status: AC
  Administered 2024-05-15: 5 mg via ORAL
  Filled 2024-05-15: qty 1

## 2024-05-15 NOTE — ED Triage Notes (Signed)
 Pt to ED for SOB since 1 month worse since last night. Worse with lying flat. LLE is red and swollen. Hx DVT. 104 fever yesterday.

## 2024-05-15 NOTE — Telephone Encounter (Signed)
 Patient Product/process development scientist completed.    The patient is insured through [employer]. Patient has [employer], may use a copay card, and/or apply for patient assistance if available.    Ran test claim for Eliquis 5 mg and the current 30 day co-pay is $0.00.   This test claim was processed through [facility]- copay amounts may vary at other pharmacies due to pharmacy/plan contracts, or as the patient moves through the different stages of their insurance plan.     Morgan Arab, CPHT Pharmacy Technician III Certified Patient Advocate [facility] Patient Advocate Team Direct Number: [phone]  Fax: [phone]

## 2024-05-15 NOTE — ED Triage Notes (Signed)
 First Nurse Note: Patient to ED via POV from doctor's office for SOB and LLE swelling. FNP reports hx of blood clots. They are concerned for cellulitis on the left leg. PT reports 104 fever yesterday. FNP also states new T inversions on EKG today.

## 2024-05-15 NOTE — Assessment & Plan Note (Addendum)
 No acute respiratory distress. Walking sa02 with me in the hallway 91-95% however she started to feel dizzy and left leg pain limited length of time we were able to walk. H/o DVT.  Concern for new t wave inversions on EKG when compared to prior 01/16/2020. Advised patient and husband ( she called him to come) that I was concerned for DOE and ACS. She would need higher level of care and serial cardiac enzymes and likely CTA chest. Patient declined going by EMS; she felt comfortable having her husband drive her directly to [facility] ED. Triage report given. Will follow.  EKG reviewed with supervising, Dr Verneita Kettering; she and I jointly agreed on management plan to proceed to ED.
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
## 2024-05-15 NOTE — Assessment & Plan Note (Addendum)
 BLE edema and tenderness. Erythema and fever  ( 104F yesterday) concerned for cellulitis. Discussed she may need imaging, labs and to initiate abx and pain control in the ED. Triage report given to [facility] ED.

## 2024-05-15 NOTE — ED Notes (Signed)
 See triage note  Presents with left lower leg redness and swelling. Also having some SOB for about 1 month  States she had fever last pm  Low grade noted on arrival

## 2024-05-15 NOTE — ED Notes (Signed)
 Blue top sent in case d dimer ordered.

## 2024-05-15 NOTE — Patient Instructions (Signed)
Go directly to ED

## 2024-05-15 NOTE — Progress Notes (Signed)
 Assessment & Plan:  Acute cough -     POC COVID-19 [facility]Now -     POCT Influenza A/B  Shortness of breath -     EKG 12-Lead  Leg swelling Assessment & Plan: BLE edema and tenderness. Erythema and fever  ( 104F yesterday) concerned for cellulitis. Discussed she may need imaging, labs and to initiate abx and pain control in the ED. Triage report given to [facility] ED.   DOE (dyspnea on exertion) Assessment & Plan: No acute respiratory distress. Walking sa02 with me in the hallway 91-95% however she started to feel dizzy and left leg pain limited length of time we were able to walk. H/o DVT.  Concern for new t wave inversions on EKG when compared to prior 01/16/2020. Advised patient and husband ( she called him to come) that I was concerned for DOE and ACS. She would need higher level of care and serial cardiac enzymes and likely CTA chest. Patient declined going by EMS; she felt comfortable having her husband drive her directly to [facility] ED. Triage report given. Will follow.  EKG reviewed with supervising, Dr Verneita Kettering; she and I jointly agreed on management plan to proceed to ED.        Return precautions given.   Risks, benefits, and alternatives of the medications and treatment plan prescribed today were discussed, and patient expressed understanding.   Education regarding symptom management and diagnosis given to patient on AVS either electronically or printed.  No follow-ups on file.  Rollene Northern, FNP  Subjective:    Patient ID: Danielle Browning, female    DOB: 05-03-1976, 48 y.o.   MRN: [id number]  CC: Danielle Browning is a 48 y.o. female who presents today for follow up.   HPI: HPI Discussed the use of AI scribe software for clinical note transcription with the patient, who gave verbal consent to proceed.  History of Present Illness   Danielle Browning is a 48 year old female who presents with fever and leg pain.  She has been experiencing a high fever of 104F since  yesterday, accompanied by shaking chills, body aches, and headache. The body aches are described as different from her usual back pain, with a sensation of 'hurting everywhere.' She also experiences shortness of breath, particularly when lying flat or walking, which started yesterday. Denies sinus congestion, wheezing, cough, cold, or chest pain. She endorses chest tightness when walking.  She has had redness and pain in her left leg for several months, worsening over the past week. The area is sensitive to touch, with no purulent discharge or drainage. She has been using over-the-counter medications, including 1000 mg of Tylenol  at 7 PM yesterday and 800 mg of ibuprofen  at midnight, to manage the pain. A small sore spot appeared on her right lower leg about a week ago while she was at the hospital with her mother.  She has a history of deep vein thrombosis in her right leg over ten years ago following a right ankle fracture, treated with blood thinners for over six months.   She denies rash, known recent tick bites. She also reports pain in her right groin area and under her belly which has been draining.      History of atopic neurodermatitis History of seasonal allergies  history of right DVT in setting of right ankle fracture 2015. Treated with anticoagulation for over 6 months; no hematologist consult.   No recent immobilization or surgery.  She has the mirena . Never smoker Allergies:  Penicillins, Other, Fruit & vegetable daily [nutritional supplements], and Sulfa antibiotics Current Outpatient Medications on File Prior to Visit  Medication Sig Dispense Refill   albuterol  (VENTOLIN  HFA) 108 (90 Base) MCG/ACT inhaler Inhale 2 puffs into the lungs every 6 (six) hours as needed for wheezing or shortness of breath. 6.7 g 0   buPROPion  (WELLBUTRIN  XL) 150 MG 24 hr tablet Take 2 tablets (300 mg total) by mouth daily. 180 tablet 3   cetirizine  (ZYRTEC ) 10 MG tablet Take 1 tablet (10 mg total) by  mouth daily. 30 tablet 2   Cholecalciferol  (VITAMIN D3 PO) Take 4,000 Int'l Units by mouth.     clobetasol  ointment (TEMOVATE ) 0.05 % Apply to affected skin qd-bid prn, Avoid applying to face, groin, and axilla. Use as directed. Long-term use can cause thinning of the skin. 30 g 2   Clotrimazole  1 % OINT Apply 1 application topically 2 (two) times daily. 56.7 g 1   cyclobenzaprine  (FLEXERIL ) 10 MG tablet TAKE 1 TABLET BY MOUTH THREE TIMES A DAY AS NEEDED FOR MUSCLE SPASMS 30 tablet 1   diphenhydrAMINE  (BENADRYL ) spray Apply topically every 4 (four) hours as needed for itching. 60 mL 0   DULoxetine  (CYMBALTA ) 30 MG capsule Take 1 capsule (30 mg total) by mouth daily. 90 capsule 3   Dupilumab  (DUPIXENT ) 300 MG/2ML SOAJ INJECT 300 MG UNDER THE SKIN EVERY 14 DAYS 4 mL 11   gabapentin  (NEURONTIN ) 300 MG capsule TAKE 1 CAPSULE BY MOUTH THREE TIMES A DAY 90 capsule 1   hydrOXYzine  (ATARAX ) 25 MG tablet Take 25 mg by mouth 3 (three) times daily.     levocetirizine (XYZAL) 5 MG tablet Take 5 mg by mouth every evening.     meloxicam  (MOBIC ) 15 MG tablet TAKE 1 TABLET (15 MG TOTAL) BY MOUTH DAILY AS NEEDED FOR PAIN WITH FOOD 30 tablet 1   Misc Natural Products (TURMERIC CURCUMIN) CAPS Take 3,000 capsules by mouth.     mometasone  (ELOCON ) 0.1 % cream Apply 1 application. topically daily as needed (Rash). Qd up to 5 days a week to eczema on hands and left foot prn flares 45 g 3   mupirocin  ointment (BACTROBAN ) 2 % Apply 1 application topically 2 (two) times daily. To lesions on abdomen. 22 g 0   omeprazole  (PRILOSEC) 20 MG capsule TAKE 1 CAPSULE (20 MG TOTAL) BY MOUTH DAILY. TAKE 30 MINUTES TO ONE HOUR BEFORE BREAKFAST 90 capsule 0   Ruxolitinib Phosphate  (OPZELURA ) 1.5 % CREA Apply to hands once a day 60 g 2   triamcinolone  (KENALOG ) 0.025 % cream Apply 1 application topically 2 (two) times daily. 80 g 2   [DISCONTINUED] fluticasone  (FLONASE ) 50 MCG/ACT nasal spray Place 2 sprays into both nostrils daily. 16 g  0   No current facility-administered medications on file prior to visit.    Review of Systems  Constitutional:  Positive for chills and fever. Negative for fatigue.  Respiratory:  Positive for shortness of breath. Negative for cough and wheezing.   Cardiovascular:  Positive for leg swelling (bilateral). Negative for chest pain and palpitations.  Gastrointestinal:  Negative for nausea and vomiting.  Skin:  Positive for rash (left groin) and wound.      Objective:    BP 130/78   Pulse 94   Temp 99 F (37.2 C) (Oral)   Ht 5' 1 (1.549 m)   Wt (!) 320 lb 3.2 oz (145.2 kg)   LMP  (LMP Unknown)   SpO2 95%   BMI  60.50 kg/m  BP Readings from Last 3 Encounters:  05/15/24 130/78  07/28/23 136/70  06/29/23 130/76   Wt Readings from Last 3 Encounters:  05/15/24 (!) 320 lb 3.2 oz (145.2 kg)  07/28/23 (!) 307 lb 9.6 oz (139.5 kg)  06/29/23 (!) 305 lb (138.3 kg)    Physical Exam Vitals reviewed.  Constitutional:      Appearance: She is well-developed.  Eyes:     Conjunctiva/sclera: Conjunctivae normal.  Cardiovascular:     Rate and Rhythm: Normal rate and regular rhythm.     Pulses: Normal pulses.     Heart sounds: Normal heart sounds.     Comments: +1 non pitting BLE edema. No palpable cords or masses.     Pulmonary:     Effort: Pulmonary effort is normal.     Breath sounds: Normal breath sounds. No wheezing, rhonchi or rales.  Skin:    General: Skin is warm and dry.         Comments: erythema BLE. increased warmth and exquisite tenderness LLE.  Drained and open 2-3 cyst. No purulent discharge or exquisite tenderness.   Neurological:     Mental Status: She is alert.  Psychiatric:        Speech: Speech normal.        Behavior: Behavior normal.        Thought Content: Thought content normal.

## 2024-05-15 NOTE — Progress Notes (Signed)
 Pharmacy Antibiotic Note  Danielle Browning is a 48 y.o. 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  Pharmacy has been consulted for vancomycin  dosing.  Plan: start vancomycin  2000 mg IV x 1 then 1000 mg IV every 12 hours Goal AUC 400-550. Expected AUC: 483 SCr used: 0.80 mg/dL   Height: 5' 1 (845.0 cm) Weight: (!) 145 kg (319 lb 10.7 oz) IBW/kg (Calculated) : 47.8  Temp (24hrs), Avg:99.1 F (37.3 C), Min:99 F (37.2 C), Max:99.4 F (37.4 C)  Recent Labs  Lab 05/15/24 1024  WBC 12.7*  CREATININE 0.68    Estimated Creatinine Clearance: 115.1 mL/min (by C-G formula based on SCr of 0.68 mg/dL).    Allergies  Allergen Reactions   Penicillins Anaphylaxis   Other Swelling    Ant bites   Fruit & Vegetable Daily [Nutritional Supplements] Other (See Comments)    Fruits with peels cause blisters in mouth, especially peaches. She can take cooked fruit and supplements.   Sulfa Antibiotics Hives and Itching    Antimicrobials this admission: 09/09 vancomycin  >>  09/09 doxycycline  x 1     Microbiology results: 09/09 BCx: pending 09/09 MRSA PCR: pending  Thank you for allowing pharmacy to be a part of this patient's care.  Adriana JONETTA Bolster 05/15/2024 2:50 PM
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
## 2024-05-15 NOTE — Discharge Instructions (Addendum)
 Plate Method for Diabetes   Foods with carbohydrates make your blood glucose level go up. The plate method is a simple way to meal plan and control the amount of carbohydrate you eat.         Use the following guidance to build a healthy plate to control carbohydrates. Divide a 9-inch plate into 3 sections, and consider your beverage the 4th section of your meal: Food Group Examples of Foods/Beverages for This Section of your Meal  Section 1: Non-starchy vegetables Fill  of your plate to include non-starchy vegetables Asparagus, broccoli, brussels sprouts, cabbage, carrots, cauliflower, celery, cucumber, green beans, mushrooms, peppers, salad greens, tomatoes, or zucchini.  Section 2: Protein foods Fill  of your plate to include a lean protein Lean meat, poultry, fish, seafood, cheese, eggs, lean deli meat, tofu, beans, lentils, nuts or nut butters.  Section 3: Carbohydrate foods Fill  of your plate to include carbohydrate foods Whole grains, whole wheat bread, brown rice, whole grain pasta, polenta, corn tortillas, fruit, or starchy vegetables (potatoes, green peas, corn, beans, acorn squash, and butternut squash). One cup of milk also counts as a food that contains carbohydrate.  Section 4: Beverage Choose water or a low-calorie drink for your beverage. Unsweetened tea, coffee, or flavored/sparkling water without added sugar.  Image reprinted with permission from The American Diabetes Association.  Copyright 2022 by the American Diabetes Association.   Copyright 2022  Academy of Nutrition and Dietetics. All rights reserved    Carbohydrate Counting For People With Diabetes  Foods with carbohydrates make your blood glucose level go up. Learning how to count carbohydrates can help you control your blood glucose levels. First, identify the foods you eat that contain carbohydrates. Then, using the Foods with Carbohydrates chart, determine about how much carbohydrates are in your meals and  snacks. Make sure you are eating foods with fiber, protein, and healthy fat along with your carbohydrate foods. Foods with Carbohydrates The following table shows carbohydrate foods that have about 15 grams of carbohydrate each. Using measuring cups, spoons, or a food scale when you first begin learning about carbohydrate counting can help you learn about the portion sizes you typically eat. The following foods have 15 grams carbohydrate each:  Grains 1 slice bread (1 ounce)  1 small tortilla (6-inch size)   large bagel (1 ounce)  1/3 cup pasta or rice (cooked)   hamburger or hot dog bun ( ounce)   cup cooked cereal   to  cup ready-to-eat cereal  2 taco shells (5-inch size) Fruit 1 small fresh fruit ( to 1 cup)   medium banana  17 small grapes (3 ounces)  1 cup melon or berries   cup canned or frozen fruit  2 tablespoons dried fruit (blueberries, cherries, cranberries, raisins)   cup unsweetened fruit juice  Starchy Vegetables  cup cooked beans, peas, corn, potatoes/sweet potatoes   large baked potato (3 ounces)  1 cup acorn or butternut squash  Snack Foods 3 to 6 crackers  8 potato chips or 13 tortilla chips ( ounce to 1 ounce)  3 cups popped popcorn  Dairy 3/4 cup (6 ounces) nonfat plain yogurt, or yogurt with sugar-free sweetener  1 cup milk  1 cup plain rice, soy, coconut or flavored almond milk Sweets and Desserts  cup ice cream or frozen yogurt  1 tablespoon jam, jelly, pancake syrup, table sugar, or honey  2 tablespoons light pancake syrup  1 inch square of frosted cake or 2 inch square of  unfrosted cake  2 small cookies (2/3 ounce each) or  large cookie  Sometimes you'll have to estimate carbohydrate amounts if you don't know the exact recipe. One cup of mixed foods like soups can have 1 to 2 carbohydrate servings, while some casseroles might have 2 or more servings of carbohydrate. Foods that have less than 20 calories in each serving can be counted as  "free" foods. Count 1 cup raw vegetables, or  cup cooked non-starchy vegetables as "free" foods. If you eat 3 or more servings at one meal, then count them as 1 carbohydrate serving.  Foods without Carbohydrates  Not all foods contain carbohydrates. Meat, some dairy, fats, non-starchy vegetables, and many beverages don't contain carbohydrate. So when you count carbohydrates, you can generally exclude chicken, pork, beef, fish, seafood, eggs, tofu, cheese, butter, sour cream, avocado, nuts, seeds, olives, mayonnaise, water, black coffee, unsweetened tea, and zero-calorie drinks. Vegetables with no or low carbohydrate include green beans, cauliflower, tomatoes, and onions. How much carbohydrate should I eat at each meal?  Carbohydrate counting can help you plan your meals and manage your weight. Following are some starting points for carbohydrate intake at each meal. Work with your registered dietitian nutritionist to find the best range that works for your blood glucose and weight.   To Lose Weight To Maintain Weight  Women 2 - 3 carb servings 3 - 4 carb servings  Men 3 - 4 carb servings 4 - 5 carb servings  Checking your blood glucose after meals will help you know if you need to adjust the timing, type, or number of carbohydrate servings in your meal plan. Achieve and keep a healthy body weight by balancing your food intake and physical activity.  Tips How should I plan my meals?  Plan for half the food on your plate to include non-starchy vegetables, like salad greens, broccoli, or carrots. Try to eat 3 to 5 servings of non-starchy vegetables every day. Have a protein food at each meal. Protein foods include chicken, fish, meat, eggs, or beans (note that beans contain carbohydrate). These two food groups (non-starchy vegetables and proteins) are low in carbohydrate. If you fill up your plate with these foods, you will eat less carbohydrate but still fill up your stomach. Try to limit your carbohydrate  portion to  of the plate.  What fats are healthiest to eat?  Diabetes increases risk for heart disease. To help protect your heart, eat more healthy fats, such as olive oil, nuts, and avocado. Eat less saturated fats like butter, cream, and high-fat meats, like bacon and sausage. Avoid trans fats, which are in all foods that list "partially hydrogenated oil" as an ingredient. What should I drink?  Choose drinks that are not sweetened with sugar. The healthiest choices are water, carbonated or seltzer waters, and tea and coffee without added sugars.  Sweet drinks will make your blood glucose go up very quickly. One serving of soda or energy drink is  cup. It is best to drink these beverages only if your blood glucose is low.  Artificially sweetened, or diet drinks, typically do not increase your blood glucose if they have zero calories in them. Read labels of beverages, as some diet drinks do have carbohydrate and will raise your blood glucose. Label Reading Tips Read Nutrition Facts labels to find out how many grams of carbohydrate are in a food you want to eat. Don't forget: sometimes serving sizes on the label aren't the same as how much food  you are going to eat, so you may need to calculate how much carbohydrate is in the food you are serving yourself.   Carbohydrate Counting for People with Diabetes Sample 1-Day Menu  Breakfast  cup yogurt, low fat, low sugar (1 carbohydrate serving)   cup cereal, ready-to-eat, unsweetened (1 carbohydrate serving)  1 cup strawberries (1 carbohydrate serving)   cup almonds ( carbohydrate serving)  Lunch 1, 5 ounce can chunk light tuna  2 ounces cheese, low fat cheddar  6 whole wheat crackers (1 carbohydrate serving)  1 small apple (1 carbohydrate servings)   cup carrots ( carbohydrate serving)   cup snap peas  1 cup 1% milk (1 carbohydrate serving)   Evening Meal Stir fry made with: 3 ounces chicken  1 cup brown rice (3 carbohydrate servings)    cup broccoli ( carbohydrate serving)   cup green beans   cup onions  1 tablespoon olive oil  2 tablespoons teriyaki sauce ( carbohydrate serving)  Evening Snack 1 extra small banana (1 carbohydrate serving)  1 tablespoon peanut butter   Carbohydrate Counting for People with Diabetes Vegan Sample 1-Day Menu  Breakfast 1 cup cooked oatmeal (2 carbohydrate servings)   cup blueberries (1 carbohydrate serving)  2 tablespoons flaxseeds  1 cup soymilk fortified with calcium and vitamin D   1 cup coffee  Lunch 2 slices whole wheat bread (2 carbohydrate servings)   cup baked tofu   cup lettuce  2 slices tomato  2 slices avocado   cup baby carrots ( carbohydrate serving)  1 orange (1 carbohydrate serving)  1 cup soymilk fortified with calcium and vitamin D    Evening Meal Burrito made with: 1 6-inch corn tortilla (1 carbohydrate serving)  1 cup refried vegetarian beans (2 carbohydrate servings)   cup chopped tomatoes   cup lettuce   cup salsa  1/3 cup brown rice (1 carbohydrate serving)  1 tablespoon olive oil for rice   cup zucchini   Evening Snack 6 small whole grain crackers (1 carbohydrate serving)  2 apricots ( carbohydrate serving)   cup unsalted peanuts ( carbohydrate serving)    Carbohydrate Counting for People with Diabetes Vegetarian (Lacto-Ovo) Sample 1-Day Menu  Breakfast 1 cup cooked oatmeal (2 carbohydrate servings)   cup blueberries (1 carbohydrate serving)  2 tablespoons flaxseeds  1 egg  1 cup 1% milk (1 carbohydrate serving)  1 cup coffee  Lunch 2 slices whole wheat bread (2 carbohydrate servings)  2 ounces low-fat cheese   cup lettuce  2 slices tomato  2 slices avocado   cup baby carrots ( carbohydrate serving)  1 orange (1 carbohydrate serving)  1 cup unsweetened tea  Evening Meal Burrito made with: 1 6-inch corn tortilla (1 carbohydrate serving)   cup refried vegetarian beans (1 carbohydrate serving)   cup tomatoes   cup lettuce    cup salsa  1/3 cup brown rice (1 carbohydrate serving)  1 tablespoon olive oil for rice   cup zucchini  1 cup 1% milk (1 carbohydrate serving)  Evening Snack 6 small whole grain crackers (1 carbohydrate serving)  2 apricots ( carbohydrate serving)   cup unsalted peanuts ( carbohydrate serving)    Copyright 2020  Academy of Nutrition and Dietetics. All rights reserved.  Using Nutrition Labels: Carbohydrate  Serving Size  Look at the serving size. All the information on the label is based on this portion. Servings Per Container  The number of servings contained in the package. Guidelines for Carbohydrate  Look at  the total grams of carbohydrate in the serving size.  1 carbohydrate choice = 15 grams of carbohydrate. Range of Carbohydrate Grams Per Choice  Carbohydrate Grams/Choice Carbohydrate Choices  6-10   11-20 1  21-25 1  26-35 2  36-40 2  41-50 3  51-55 3  56-65 4  66-70 4  71-80 5    Copyright 2020  Academy of Nutrition and Dietetics. All rights reserved.

## 2024-05-15 NOTE — ED Provider Notes (Signed)
 Danielle Browning Provider Note    Event Date/Time   First MD Initiated Contact with Patient 05/15/24 1039     (approximate)   History   Leg Pain and Leg Swelling   HPI  Danielle Browning is a 48 y.o. 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  Sent in by urgent care for further evaluation.  Has been having 1 month of shortness of breath that is worse at night, also noted that her left lower extremity is red and swollen.  The redness and swelling to her left lower extremity has been ongoing for a month has gotten worse in the last couple days.  States that she noted it be tender yesterday when she also noted a temperature of 104 yesterday.  Has not been on antibiotics for cellulitis.  States that she is not on any anticoagulation because her prior DVT was 15 years ago, provoked after a leg fracture.  No pain or redness to her right lower extremity.  Does note some chest tightness but no pressure.  States that because of the pain she is having trouble with walking.  On independent review, she went to urgent care, had an EKG done that showed a T wave inversion to V2, was sent for further evaluation.  Had COVID and flu swab that were both negative at urgent care today.     Physical Exam   Triage Vital Signs: ED Triage Vitals  Encounter Vitals Group     BP 05/15/24 1023 127/88     Girls Systolic BP Percentile --      Girls Diastolic BP Percentile --      Boys Systolic BP Percentile --      Boys Diastolic BP Percentile --      Pulse Rate 05/15/24 1018 91     Resp 05/15/24 1018 20     Temp 05/15/24 1018 99 F (37.2 C)     Temp Source 05/15/24 1018 Oral     SpO2 05/15/24 1018 95 %     Weight 05/15/24 1020 (!) 319 lb 10.7 oz (145 kg)     Height 05/15/24 1020 5' 1 (1.549 m)     Head Circumference --      Peak Flow --      Pain  Score 05/15/24 1019 10     Pain Loc --      Pain Education --      Exclude from Growth Chart --     Most recent vital signs: Vitals:   05/15/24 1023 05/15/24 1426  BP: 127/88 121/60  Pulse:  89  Resp:  18  Temp:  99.4 F (37.4 C)  SpO2:  96%     General: Awake, no distress.  CV:  Good peripheral perfusion.  Resp:  Normal effort.  No tachypnea or respiratory distress Abd:  No distention.  Soft nontender Other:  No obvious unilateral swelling, she does have erythema with associated tenderness to her left lower extremity, no focal weakness or numbness, not pain at proportion, no palpable crepitus, does have lower extremity edema bilaterally.  Equal DP pulses bilaterally.  ED Results / Procedures / Treatments   Labs (all labs ordered are listed, but only abnormal results are displayed) Labs Reviewed  CBC WITH DIFFERENTIAL/PLATELET - Abnormal; Notable for the following components:      Result Value   WBC 12.7 (*)  Neutro Abs 10.7 (*)    All other components within normal limits  BASIC METABOLIC PANEL WITH GFR - Abnormal; Notable for the following components:   Glucose, Bld 242 (*)    Calcium 8.4 (*)    All other components within normal limits  MRSA NEXT GEN BY PCR, NASAL  HCG, QUANTITATIVE, PREGNANCY  BRAIN NATRIURETIC PEPTIDE  HIV ANTIBODY (ROUTINE TESTING W REFLEX)  LACTIC ACID, PLASMA  LACTIC ACID, PLASMA  TROPONIN I (HIGH SENSITIVITY)     EKG  EKG shows, EKG shows sinus rhythm, rate 83, normal QS, normal QTc, no obvious ischemic ST elevation, T wave flattening in aVL, V3, V2, prior T wave change in V2 was not present   RADIOLOGY On my independent interpretation, chest x-ray without focal consolidation   PROCEDURES:  Critical Care performed: No  Procedures   MEDICATIONS ORDERED IN ED: Medications  doxycycline  (VIBRAMYCIN ) 100 mg in sodium chloride  0.9 % 250 mL IVPB (100 mg Intravenous New Bag/Given 05/15/24 1401)  apixaban  (ELIQUIS ) tablet 10 mg (10  mg Oral Given 05/15/24 1441)    Followed by  apixaban  (ELIQUIS ) tablet 5 mg (has no administration in time range)  cetirizine  (ZYRTEC ) tablet 10 mg (has no administration in time range)  clobetasol  ointment (TEMOVATE ) 0.05 % (has no administration in time range)  mometasone  (ELOCON ) 0.1 % cream 1 Application (has no administration in time range)  ondansetron  (ZOFRAN ) tablet 4 mg (has no administration in time range)    Or  ondansetron  (ZOFRAN ) injection 4 mg (has no administration in time range)  acetaminophen  (TYLENOL ) tablet 650 mg (has no administration in time range)    Or  acetaminophen  (TYLENOL ) suppository 650 mg (has no administration in time range)  HYDROcodone -acetaminophen  (NORCO/VICODIN) 5-325 MG per tablet 1-2 tablet (has no administration in time range)  morphine  (PF) 2 MG/ML injection 2 mg (has no administration in time range)  senna-docusate (Senokot-S) tablet 1 tablet (has no administration in time range)  saccharomyces boulardii (FLORASTOR) capsule 250 mg (has no administration in time range)  albuterol  (PROVENTIL ) (2.5 MG/3ML) 0.083% nebulizer solution 2.5 mg (has no administration in time range)  vancomycin  (VANCOREADY) IVPB 2000 mg/400 mL (has no administration in time range)  vancomycin  (VANCOCIN ) IVPB 1000 mg/200 mL premix (has no administration in time range)  buPROPion  (WELLBUTRIN  XL) 24 hr tablet 300 mg (has no administration in time range)  DULoxetine  (CYMBALTA ) DR capsule 30 mg (has no administration in time range)  gabapentin  (NEURONTIN ) capsule 300 mg (has no administration in time range)  morphine  (PF) 2 MG/ML injection 2 mg (2 mg Intravenous Given 05/15/24 1126)  iohexol  (OMNIPAQUE ) 350 MG/ML injection 75 mL (75 mLs Intravenous Contrast Given 05/15/24 1302)  oxyCODONE  (Oxy IR/ROXICODONE ) immediate release tablet 5 mg (5 mg Oral Given 05/15/24 1323)     IMPRESSION / MDM / ASSESSMENT AND PLAN / ED COURSE  I reviewed the triage vital signs and the nursing notes.                               Differential diagnosis includes, but is not limited to, viral illness, pneumonia, atypical ACS, arrhythmia, PE, DVT, cellulitis, electrolyte derangements.  Labs, EKG, troponin, chest x-ray, CT PE study, DVT ultrasound.  Patient's presentation is most consistent with acute presentation with potential threat to life or bodily function.  Independent interpretation of labs and imaging below.  Will treat her cellulitis with doxycycline .  First dose given in the emergency department.  CT PE shows a very small PE, no right heart strain.  On reassessment patient still has some pain to her left lower extremity, states that she has difficulty walking.  Will plan to have her admitted for pain control, anticoagulation and IV antibiotics.  Consulted the hospitalist who is agreeable with plan for admission and will evaluate the patient.  She is admitted.  The patient is on the cardiac monitor to evaluate for evidence of arrhythmia and/or significant heart rate changes.   Clinical Course as of 05/15/24 1552  Tue May 15, 2024  1124 Independent review of labs, mild leukocytosis, electrolytes not severely deranged, troponin is not elevated.  BNP is not elevated. [TT]  1146 DG Chest 1 View IMPRESSION: 1. Limited evaluation due to underpenetration. 2. Low lung volumes with right basilar opacities suggestive of atelectasis   [TT]  1306 US  Venous Img Lower  Left (DVT Study) IMPRESSION: 1. No evidence of DVT. 2. Limited visualization of the posterior tibial and peroneal veins due to lower extremity edema.   [TT]  1334 CT Angio Chest PE W/Cm &/Or Wo Cm IMPRESSION: 1. Very small volume nonocclusive thrombus in the right lung lower lobe proximal subsegmental pulmonary artery branch. No associated right heart strain or lung infarction. 2. No lung mass, consolidation, pleural effusion or pneumothorax. No suspicious lung nodule. 3. Otherwise essentially unremarkable exam, as described  above.   [TT]    Clinical Course User Index [TT] Waymond Lorelle Cummins, MD     FINAL CLINICAL IMPRESSION(S) / ED DIAGNOSES   Final diagnoses:  Cellulitis of left lower extremity  Leg swelling  SOB (shortness of breath)  Acute pulmonary embolism without acute cor pulmonale, unspecified pulmonary embolism type (HCC)     Rx / DC Orders   ED Discharge Orders     None        Note:  This document was prepared using Dragon voice recognition software and may include unintentional dictation errors.    Waymond Lorelle Cummins, MD 05/15/24 [phone]

## 2024-05-16 DIAGNOSIS — L03116 Cellulitis of left lower limb: Secondary | ICD-10-CM | POA: Diagnosis not present

## 2024-05-16 LAB — CBC
HCT: 35.5 % — ABNORMAL LOW (ref 36.0–46.0)
Hemoglobin: 12.1 g/dL (ref 12.0–15.0)
MCH: 28.7 pg (ref 26.0–34.0)
MCHC: 34.1 g/dL (ref 30.0–36.0)
MCV: 84.1 fL (ref 80.0–100.0)
Platelets: 249 K/uL (ref 150–400)
RBC: 4.22 MIL/uL (ref 3.87–5.11)
RDW: 13 % (ref 11.5–15.5)
WBC: 9 K/uL (ref 4.0–10.5)
nRBC: 0 % (ref 0.0–0.2)

## 2024-05-16 LAB — BASIC METABOLIC PANEL WITH GFR
Anion gap: 7 (ref 5–15)
BUN: 13 mg/dL (ref 6–20)
CO2: 25 mmol/L (ref 22–32)
Calcium: 8.2 mg/dL — ABNORMAL LOW (ref 8.9–10.3)
Chloride: 104 mmol/L (ref 98–111)
Creatinine, Ser: 0.77 mg/dL (ref 0.44–1.00)
GFR, Estimated: >60 mL/min (ref >60)
Glucose, Bld: 239 mg/dL — ABNORMAL HIGH (ref 70–99)
Potassium: 3.8 mmol/L (ref 3.5–5.1)
Sodium: 136 mmol/L (ref 135–145)

## 2024-05-16 LAB — LACTIC ACID, PLASMA: Lactic Acid, Venous: 1.1 mmol/L (ref 0.5–1.9)

## 2024-05-16 LAB — HIV ANTIBODY (ROUTINE TESTING W REFLEX): HIV Screen 4th Generation wRfx: NONREACTIVE

## 2024-05-16 MED ORDER — SODIUM CHLORIDE 0.9 % IV SOLN: INTRAVENOUS | Status: AC

## 2024-05-16 MED ORDER — VITAMIN D 25 MCG (1000 UNIT) PO TABS
4000.0000 [IU] | ORAL_TABLET | Freq: Every day | ORAL | Status: DC
  Administered 2024-05-16 – 2024-05-22 (×7): 4000 [IU] via ORAL
  Filled 2024-05-16 (×7): qty 4

## 2024-05-16 MED ORDER — HYDROXYZINE HCL 25 MG PO TABS
25.0000 mg | ORAL_TABLET | Freq: Three times a day (TID) | ORAL | Status: DC
  Administered 2024-05-16 – 2024-05-21 (×15): 25 mg via ORAL
  Filled 2024-05-16 (×16): qty 1

## 2024-05-16 NOTE — Plan of Care (Signed)
   Problem: Education: Goal: Knowledge of General Education information will improve Description: Including pain rating scale, medication(s)/side effects and non-pharmacologic comfort measures Outcome: Progressing   Problem: Pain Managment: Goal: General experience of comfort will improve and/or be controlled Outcome: Progressing   Problem: Safety: Goal: Ability to remain free from injury will improve Outcome: Progressing

## 2024-05-16 NOTE — Progress Notes (Signed)
 PROGRESS NOTE    Danielle Browning  FMW:[id number] DOB: 1976/06/01 DOA: 05/15/2024 PCP: Dineen Rollene MATSU, FNP   Brief Narrative:   48 y.o. female with medical history significant of remote history of DVT and PE, contact dermatitis, morbid obesity, presented with worsening of left leg rash swelling and pain, found to have small right lower lobe PE, has been started on IV antibiotics and eliquis . She does have prior h/o PE after right ankle fracture, 15 years back.  Assessment & Plan:  Principal Problem:   Sepsis (HCC) Active Problems:   Cellulitis   Left leg cellulitis,POA:  - Continue broad spectrum antibiotics. De-escalate once appropriate. 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 -She would likely need to be on life long anticoagulation at this point -Risk factors include sedentary life style, Class III obesity and prior episode of PE.   SIRS - Secondary to cellulitis, management as above   Class III obesity with adiposopathy symptoms: -BMI>60 - Outpatient GLP-1 agonist therapy evaluation  Likely obstructive sleep apnea,POA: Reports frequent apneic spells at night, frequent night time awakenings, morning fatigue, headaches and day time sleepiness. Needs sleep study as an outpatient and CPAP arrangement.   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   DVT prophylaxis:  apixaban  (ELIQUIS ) tablet 10 mg  apixaban  (ELIQUIS ) tablet 5 mg     Code Status: Full Code Family Communication:   Status is: Inpatient Remains inpatient appropriate because: Left leg  cellulitis, PE    Subjective:  Complaining of left leg pain. She said her left is chronically red but it has gotten worse over the past 2-3 days. She did mention to me a fever of almost 104 at home 2 days back. She works in an office. She lives at home with her husband.  Examination:  General exam: Appears calm and comfortable  Respiratory system: Clear to auscultation. Respiratory effort normal. Cardiovascular system: S1 & S2 heard, RRR. No JVD, murmurs, rubs, gallops or clicks. No pedal edema. Gastrointestinal system: Abdomen is nondistended, soft and nontender. No organomegaly or masses felt. Normal bowel sounds heard. Central nervous system: Alert and oriented. No focal neurological deficits. Extremities: Symmetric 5 x 5 power. Skin: Left leg is slightly swollen, erythematous and tender, extending from just below the left ankle to the left knee. No evidence of ulceration or necrosis/gangrene Psychiatry: Judgement and insight appear normal. Mood & affect appropriate.       Diet Orders (From admission, onward)     Start     Ordered   05/15/24 1447  Diet regular Room service appropriate? Yes; Fluid consistency: Thin  Diet effective now       Question Answer Comment  Room service appropriate? Yes   Fluid consistency: Thin      05/15/24 1447            Objective: Vitals:   05/15/24 2203 05/15/24 2204 05/16/24 0410 05/16/24 0804  BP: (!) 141/73  110/62 (!) 140/91  Pulse: 95 91 79 79  Resp: 17  18 18   Temp: 99.5 F (37.5 C)  99 F (37.2 C) 98.9  F (37.2 C)  TempSrc:   Oral Oral  SpO2: (!) 87% 95% 98% 96%  Weight:      Height:        Intake/Output Summary (Last 24 hours) at 05/16/2024 1133 Last data filed at 05/16/2024 0900 Gross per 24 hour  Intake 385.89 ml  Output --  Net 385.89 ml   Filed Weights   05/15/24 1020  Weight: (!) 145 kg    Scheduled Meds:  apixaban   10 mg Oral BID   Followed by   NOREEN ON 05/22/2024] apixaban   5 mg Oral BID    buPROPion   300 mg Oral Daily   cetirizine   10 mg Oral QPM   DULoxetine   30 mg Oral Daily   gabapentin   300 mg Oral TID   saccharomyces boulardii  250 mg Oral BID   Continuous Infusions:  vancomycin  200 mL/hr at 05/16/24 0654    Nutritional status     Body mass index is 60.4 kg/m.  Data Reviewed:   CBC: Recent Labs  Lab 05/15/24 1024 05/16/24 0134  WBC 12.7* 9.0  NEUTROABS 10.7*  --   HGB 14.4 12.1  HCT 42.8 35.5*  MCV 85.1 84.1  PLT 283 249   Basic Metabolic Panel: Recent Labs  Lab 05/15/24 1024 05/16/24 0134  NA 136 136  K 3.8 3.8  CL 101 104  CO2 26 25  GLUCOSE 242* 239*  BUN 12 13  CREATININE 0.68 0.77  CALCIUM 8.4* 8.2*   GFR: Estimated Creatinine Clearance: 115.1 mL/min (by C-G formula based on SCr of 0.77 mg/dL). Liver Function Tests: No results for input(s): AST, ALT, ALKPHOS, BILITOT, PROT, ALBUMIN in the last 168 hours. No results for input(s): LIPASE, AMYLASE in the last 168 hours. No results for input(s): AMMONIA in the last 168 hours. Coagulation Profile: No results for input(s): INR, PROTIME in the last 168 hours. Cardiac Enzymes: No results for input(s): CKTOTAL, CKMB, CKMBINDEX, TROPONINI in the last 168 hours. BNP (last 3 results) No results for input(s): PROBNP in the last 8760 hours. HbA1C: No results for input(s): HGBA1C in the last 72 hours. CBG: No results for input(s): GLUCAP in the last 168 hours. Lipid Profile: No results for input(s): CHOL, HDL, LDLCALC, TRIG, CHOLHDL, LDLDIRECT in the last 72 hours. Thyroid  Function Tests: No results for input(s): TSH, T4TOTAL, FREET4, T3FREE, THYROIDAB in the last 72 hours. Anemia Panel: No results for input(s): VITAMINB12, FOLATE, FERRITIN, TIBC, IRON, RETICCTPCT in the last 72 hours. Sepsis Labs: Recent Labs  Lab 05/15/24 1949 05/16/24 0134  LATICACIDVEN 1.0 1.1    Recent Results (from the past 240 hours)  MRSA  Next Gen by PCR, Nasal     Status: None   Collection Time: 05/15/24  7:49 PM   Specimen: Nasal Mucosa; Nasal Swab  Result Value Ref Range Status   MRSA by PCR Next Gen NOT DETECTED NOT DETECTED Final    Comment: (NOTE) The GeneXpert MRSA Assay (FDA approved for NASAL specimens only), is one component of a comprehensive MRSA colonization surveillance program. It is not intended to diagnose MRSA infection nor to guide or monitor treatment for MRSA infections. Test performance is not FDA approved in patients less than 62 years old. Performed at [facility], [address] [place], [place] [address]          Radiology Studies: DG Tibia/Fibula Left Result Date: 05/15/2024 EXAM: 3 VIEW(S) XRAY OF THE LEFT TIBIA AND FIBULA 05/15/2024 03:17:58 PM COMPARISON: None available. CLINICAL HISTORY: 07680 Cellulitis 92319.  Cellulitis FINDINGS: BONES AND JOINTS: No acute fracture. No focal osseous lesion. No joint dislocation. Degenerative changes are noted within the left knee. SOFT TISSUES: Diffuse subcutaneous soft tissue edema. IMPRESSION: 1. Diffuse subcutaneous soft tissue edema, consistent with cellulitis. 2. Degenerative changes in the left knee. Electronically signed by: Waddell Calk MD 05/15/2024 03:54 PM EDT RP Workstation: [id number]   CT Angio Chest PE W/Cm &/Or Wo Cm Addendum Date: 05/15/2024 ADDENDUM REPORT: 05/15/2024 13:39 ADDENDUM: Critical Value/emergent results were called by telephone at the time of interpretation on 05/15/2024 at 1:39 pm to provider TING TAN , who verbally acknowledged these results. Electronically Signed   By: Ree Molt M.D.   On: 05/15/2024 13:39   Result Date: 05/15/2024 CLINICAL DATA:  Pulmonary embolism (PE) suspected, high prob. Left lower leg redness and swelling. Shortness of breath. EXAM: CT ANGIOGRAPHY CHEST WITH CONTRAST TECHNIQUE: Multidetector CT imaging of the chest was performed using the standard protocol during bolus administration of  intravenous contrast. Multiplanar CT image reconstructions and MIPs were obtained to evaluate the vascular anatomy. RADIATION DOSE REDUCTION: This exam was performed according to the departmental dose-optimization program which includes automated exposure control, adjustment of the mA and/or kV according to patient size and/or use of iterative reconstruction technique. CONTRAST:  75mL OMNIPAQUE  IOHEXOL  350 MG/ML SOLN COMPARISON:  None Available. FINDINGS: Cardiovascular: Examination is limited due to suboptimal contrast opacification. There is very small volume nonocclusive thrombus in the right lung lower lobe proximal subsegmental pulmonary artery branch (series 5, image 162). No associated right heart strain or lung infarction. Normal cardiac size. No pericardial effusion. No aortic aneurysm. Mediastinum/Nodes: Visualized thyroid  gland appears grossly unremarkable. No solid / cystic mediastinal masses. The esophagus is nondistended precluding optimal assessment. No axillary, mediastinal or hilar lymphadenopathy by size criteria. Lungs/Pleura: The central tracheo-bronchial tree is patent. No mass or consolidation. No pleural effusion or pneumothorax. No suspicious lung nodules. Upper Abdomen: Visualized upper abdominal viscera within normal limits. Musculoskeletal: The visualized soft tissues of the chest wall are grossly unremarkable. No suspicious osseous lesions. There are mild multilevel degenerative changes in the visualized spine. Review of the MIP images confirms the above findings. IMPRESSION: 1. Very small volume nonocclusive thrombus in the right lung lower lobe proximal subsegmental pulmonary artery branch. No associated right heart strain or lung infarction. 2. No lung mass, consolidation, pleural effusion or pneumothorax. No suspicious lung nodule. 3. Otherwise essentially unremarkable exam, as described above. Electronically Signed: By: Ree Molt M.D. On: 05/15/2024 13:32   US  Venous Img Lower   Left (DVT Study) Result Date: 05/15/2024 EXAM: ULTRASOUND DUPLEX OF THE LEFT LOWER EXTREMITY VEINS TECHNIQUE: Duplex ultrasound using B-mode/gray scaled imaging and Doppler spectral analysis and color flow was obtained of the deep venous structures of the left lower extremity. COMPARISON: None. CLINICAL HISTORY: Swelling. FINDINGS: The visualized veins of the lower extremity are patent and free of echogenic thrombus. The veins demonstrate good compressibility with normal color flow study and spectral analysis. There is limited visualization of the posterior tibial and peroneal veins due to lower extremity edema. IMPRESSION: 1. No evidence of DVT. 2. Limited visualization of the posterior tibial and peroneal veins due to lower extremity edema. Electronically signed by: Waddell Calk MD 05/15/2024 01:03 PM EDT RP Workstation: [id number]   DG Chest 1 View Result Date: 05/15/2024 EXAM: 1 VIEW XRAY OF THE CHEST 05/15/2024 10:50:00 AM COMPARISON: Chest radiograph 05/17/2021. CLINICAL HISTORY: Patient presents with SOB since 1 month worse since last night. Worse with lying flat. LLE is  red and swollen. FINDINGS: LUNGS AND PLEURA: Low lung volumes. Right basilar opacities suggestive of atelectasis. No pleural effusion. No pneumothorax. HEART AND MEDIASTINUM: No acute abnormality of the cardiac and mediastinal silhouettes. BONES AND SOFT TISSUES: No acute osseous abnormality. IMPRESSION: 1. Limited evaluation due to underpenetration. 2. Low lung volumes with right basilar opacities suggestive of atelectasis. Electronically signed by: Donnice Mania MD 05/15/2024 11:31 AM EDT RP Workstation: [id number]        LOS: 1 day   Time spent= 41 mins    Deliliah Room, MD [facility]  If 7PM-7AM, please contact night-coverage  05/16/2024, 11:33 AM

## 2024-05-17 ENCOUNTER — Other Ambulatory Visit (HOSPITAL_COMMUNITY): Payer: Self-pay

## 2024-05-17 ENCOUNTER — Telehealth (HOSPITAL_COMMUNITY): Payer: Self-pay | Admitting: Pharmacy Technician

## 2024-05-17 DIAGNOSIS — L03116 Cellulitis of left lower limb: Secondary | ICD-10-CM | POA: Diagnosis not present

## 2024-05-17 MED ORDER — CEFADROXIL 500 MG PO CAPS
500.0000 mg | ORAL_CAPSULE | Freq: Two times a day (BID) | ORAL | Status: DC
  Filled 2024-05-17: qty 1

## 2024-05-17 MED ORDER — VANCOMYCIN HCL IN DEXTROSE 1-5 GM/200ML-% IV SOLN
1000.0000 mg | Freq: Two times a day (BID) | INTRAVENOUS | Status: AC
  Administered 2024-05-17: 1000 mg via INTRAVENOUS
  Filled 2024-05-17: qty 200

## 2024-05-17 NOTE — Telephone Encounter (Signed)
 Pharmacy Patient Advocate Encounter  Insurance verification completed.    The patient is insured through [employer]. Patient has [employer], may use a copay card, and/or apply for patient assistance if available.    Ran test claim for Linezolid 600mg  tablets and the medication requires a prior authorization.  Cash price for 14 tablets is $51.93.   This test claim was processed through [facility]- copay amounts may vary at other pharmacies due to pharmacy/plan contracts, or as the patient moves through the different stages of their insurance plan.

## 2024-05-17 NOTE — Progress Notes (Signed)
 PROGRESS NOTE    Danielle Browning  FMW:[id number] DOB: February 28, 1976 DOA: 05/15/2024 PCP: Dineen Rollene MATSU, FNP   Brief Narrative:   48 y.o. female with medical history significant of remote history of DVT and PE, contact dermatitis, morbid obesity, presented with worsening of left leg rash swelling and pain, found to have small right lower lobe PE, has been started on IV antibiotics and eliquis . She does have prior h/o PE after right ankle fracture, 15 years back.  Assessment & Plan:  Principal Problem:   Sepsis (HCC) Active Problems:   Cellulitis   Left leg cellulitis,POA: Improving - Continue broad spectrum antibiotics. De-escalate once appropriate. 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 -She would likely need to be on life long anticoagulation at this point -Risk factors include sedentary life style, Class III obesity and prior episode of PE.   SIRS - Secondary to cellulitis, management as above   Class III obesity with adiposopathy symptoms: -BMI>60 - Outpatient GLP-1 agonist therapy evaluation  Likely obstructive sleep apnea,POA: Reports frequent apneic spells at night, frequent night time awakenings, morning fatigue, headaches and day time sleepiness. Needs sleep study as an outpatient and CPAP arrangement.   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   DVT prophylaxis:  apixaban  (ELIQUIS ) tablet 10 mg  apixaban  (ELIQUIS ) tablet 5 mg     Code Status: Full Code Family Communication:  None at the bedside  Status is: Inpatient Remains inpatient  appropriate because: Left leg cellulitis, PE    Subjective:  Left leg is less swollen and red today so she feels better. No shortness of breath or chest pain. We spoke about possibly changing IV antibiotics to oral tomorrow and sending her home on that.  Examination:  General exam: Appears calm and comfortable  Respiratory system: Clear to auscultation. Respiratory effort normal. Cardiovascular system: S1 & S2 heard, RRR. No JVD, murmurs, rubs, gallops or clicks. No pedal edema. Gastrointestinal system: Abdomen is nondistended, soft and nontender. No organomegaly or masses felt. Normal bowel sounds heard. Central nervous system: Alert and oriented. No focal neurological deficits. Extremities: Symmetric 5 x 5 power. Skin: Left leg is slightly swollen, erythematous and tender, extending from just below the left ankle to the left knee. No evidence of ulceration or necrosis/gangrene. Overall, it looks improved as compared to yesterday Psychiatry: Judgement and insight appear normal. Mood & affect appropriate.       Diet Orders (From admission, onward)     Start     Ordered   05/15/24 1447  Diet regular Room service appropriate? Yes; Fluid consistency: Thin  Diet effective now       Question Answer Comment  Room service appropriate? Yes   Fluid consistency: Thin      05/15/24 1447            Objective: Vitals:   05/16/24 2100 05/17/24 0511 05/17/24 0608 05/17/24 0927  BP: 130/78 96/69 128/71 139/76  Pulse: 80 87 86 92  Resp: 18 18  20   Temp: 98.2 F (36.8 C) 98 F (36.7 C)  98.2 F (36.8 C)  TempSrc: Oral Oral    SpO2: 99% 94% 94% 95%  Weight:      Height:        Intake/Output Summary (Last 24 hours) at 05/17/2024 1043 Last data filed at 05/17/2024 0300 Gross per 24 hour  Intake 1247.72 ml  Output --  Net 1247.72 ml   Filed Weights   05/15/24 1020  Weight: (!) 145 kg    Scheduled Meds:  apixaban   10 mg Oral BID   Followed by   NOREEN ON 05/22/2024]  apixaban   5 mg Oral BID   buPROPion   300 mg Oral Daily   cetirizine   10 mg Oral QPM   cholecalciferol   4,000 Units Oral Daily   DULoxetine   30 mg Oral Daily   gabapentin   300 mg Oral TID   hydrOXYzine   25 mg Oral TID   saccharomyces boulardii  250 mg Oral BID   Continuous Infusions:  vancomycin  1,000 mg (05/17/24 0513)    Nutritional status     Body mass index is 60.4 kg/m.  Data Reviewed:   CBC: Recent Labs  Lab 05/15/24 1024 05/16/24 0134  WBC 12.7* 9.0  NEUTROABS 10.7*  --   HGB 14.4 12.1  HCT 42.8 35.5*  MCV 85.1 84.1  PLT 283 249   Basic Metabolic Panel: Recent Labs  Lab 05/15/24 1024 05/16/24 0134  NA 136 136  K 3.8 3.8  CL 101 104  CO2 26 25  GLUCOSE 242* 239*  BUN 12 13  CREATININE 0.68 0.77  CALCIUM 8.4* 8.2*   GFR: Estimated Creatinine Clearance: 115.1 mL/min (by C-G formula based on SCr of 0.77 mg/dL). Liver Function Tests: No results for input(s): AST, ALT, ALKPHOS, BILITOT, PROT, ALBUMIN in the last 168 hours. No results for input(s): LIPASE, AMYLASE in the last 168 hours. No results for input(s): AMMONIA in the last 168 hours. Coagulation Profile: No results for input(s): INR, PROTIME in the last 168 hours. Cardiac Enzymes: No results for input(s): CKTOTAL, CKMB, CKMBINDEX, TROPONINI in the last 168 hours. BNP (last 3 results) No results for input(s): PROBNP in the last 8760 hours. HbA1C: No results for input(s): HGBA1C in the last 72 hours. CBG: No results for input(s): GLUCAP in the last 168 hours. Lipid Profile: No results for input(s): CHOL, HDL, LDLCALC, TRIG, CHOLHDL, LDLDIRECT in the last 72 hours. Thyroid  Function Tests: No results for input(s): TSH, T4TOTAL, FREET4, T3FREE, THYROIDAB in the last 72 hours. Anemia Panel: No results for input(s): VITAMINB12, FOLATE, FERRITIN, TIBC, IRON, RETICCTPCT in the last 72 hours. Sepsis Labs: Recent Labs  Lab  05/15/24 1949 05/16/24 0134  LATICACIDVEN 1.0 1.1    Recent Results (from the past 240 hours)  MRSA Next Gen by PCR, Nasal     Status: None   Collection Time: 05/15/24  7:49 PM   Specimen: Nasal Mucosa; Nasal Swab  Result Value Ref Range Status   MRSA by PCR Next Gen NOT DETECTED NOT DETECTED Final    Comment: (NOTE) The GeneXpert MRSA Assay (FDA approved for NASAL specimens only), is one component of a comprehensive MRSA colonization surveillance program. It is not intended to diagnose MRSA infection nor to guide or monitor treatment for MRSA infections. Test performance is not FDA approved in patients less than 13 years old. Performed at [facility], [address] [place], [place] [address]          Radiology Studies: DG Tibia/Fibula Left Result Date: 05/15/2024 EXAM: 3 VIEW(S) XRAY OF THE LEFT TIBIA AND FIBULA 05/15/2024  03:17:58 PM COMPARISON: None available. CLINICAL HISTORY: 07680 Cellulitis 92319. Cellulitis FINDINGS: BONES AND JOINTS: No acute fracture. No focal osseous lesion. No joint dislocation. Degenerative changes are noted within the left knee. SOFT TISSUES: Diffuse subcutaneous soft tissue edema. IMPRESSION: 1. Diffuse subcutaneous soft tissue edema, consistent with cellulitis. 2. Degenerative changes in the left knee. Electronically signed by: Waddell Calk MD 05/15/2024 03:54 PM EDT RP Workstation: [id number]   CT Angio Chest PE W/Cm &/Or Wo Cm Addendum Date: 05/15/2024 ADDENDUM REPORT: 05/15/2024 13:39 ADDENDUM: Critical Value/emergent results were called by telephone at the time of interpretation on 05/15/2024 at 1:39 pm to provider TING TAN , who verbally acknowledged these results. Electronically Signed   By: Ree Molt M.D.   On: 05/15/2024 13:39   Result Date: 05/15/2024 CLINICAL DATA:  Pulmonary embolism (PE) suspected, high prob. Left lower leg redness and swelling. Shortness of breath. EXAM: CT ANGIOGRAPHY CHEST WITH CONTRAST TECHNIQUE:  Multidetector CT imaging of the chest was performed using the standard protocol during bolus administration of intravenous contrast. Multiplanar CT image reconstructions and MIPs were obtained to evaluate the vascular anatomy. RADIATION DOSE REDUCTION: This exam was performed according to the departmental dose-optimization program which includes automated exposure control, adjustment of the mA and/or kV according to patient size and/or use of iterative reconstruction technique. CONTRAST:  75mL OMNIPAQUE  IOHEXOL  350 MG/ML SOLN COMPARISON:  None Available. FINDINGS: Cardiovascular: Examination is limited due to suboptimal contrast opacification. There is very small volume nonocclusive thrombus in the right lung lower lobe proximal subsegmental pulmonary artery branch (series 5, image 162). No associated right heart strain or lung infarction. Normal cardiac size. No pericardial effusion. No aortic aneurysm. Mediastinum/Nodes: Visualized thyroid  gland appears grossly unremarkable. No solid / cystic mediastinal masses. The esophagus is nondistended precluding optimal assessment. No axillary, mediastinal or hilar lymphadenopathy by size criteria. Lungs/Pleura: The central tracheo-bronchial tree is patent. No mass or consolidation. No pleural effusion or pneumothorax. No suspicious lung nodules. Upper Abdomen: Visualized upper abdominal viscera within normal limits. Musculoskeletal: The visualized soft tissues of the chest wall are grossly unremarkable. No suspicious osseous lesions. There are mild multilevel degenerative changes in the visualized spine. Review of the MIP images confirms the above findings. IMPRESSION: 1. Very small volume nonocclusive thrombus in the right lung lower lobe proximal subsegmental pulmonary artery branch. No associated right heart strain or lung infarction. 2. No lung mass, consolidation, pleural effusion or pneumothorax. No suspicious lung nodule. 3. Otherwise essentially unremarkable exam,  as described above. Electronically Signed: By: Ree Molt M.D. On: 05/15/2024 13:32   US  Venous Img Lower  Left (DVT Study) Result Date: 05/15/2024 EXAM: ULTRASOUND DUPLEX OF THE LEFT LOWER EXTREMITY VEINS TECHNIQUE: Duplex ultrasound using B-mode/gray scaled imaging and Doppler spectral analysis and color flow was obtained of the deep venous structures of the left lower extremity. COMPARISON: None. CLINICAL HISTORY: Swelling. FINDINGS: The visualized veins of the lower extremity are patent and free of echogenic thrombus. The veins demonstrate good compressibility with normal color flow study and spectral analysis. There is limited visualization of the posterior tibial and peroneal veins due to lower extremity edema. IMPRESSION: 1. No evidence of DVT. 2. Limited visualization of the posterior tibial and peroneal veins due to lower extremity edema. Electronically signed by: Waddell Calk MD 05/15/2024 01:03 PM EDT RP Workstation: [id number]   DG Chest 1 View Result Date: 05/15/2024 EXAM: 1 VIEW XRAY OF THE CHEST 05/15/2024 10:50:00 AM COMPARISON: Chest radiograph 05/17/2021. CLINICAL HISTORY: Patient presents with SOB since 1 month  worse since last night. Worse with lying flat. LLE is red and swollen. FINDINGS: LUNGS AND PLEURA: Low lung volumes. Right basilar opacities suggestive of atelectasis. No pleural effusion. No pneumothorax. HEART AND MEDIASTINUM: No acute abnormality of the cardiac and mediastinal silhouettes. BONES AND SOFT TISSUES: No acute osseous abnormality. IMPRESSION: 1. Limited evaluation due to underpenetration. 2. Low lung volumes with right basilar opacities suggestive of atelectasis. Electronically signed by: Donnice Mania MD 05/15/2024 11:31 AM EDT RP Workstation: [id number]        LOS: 2 days   Time spent= 41 mins    Deliliah Room, MD [facility]  If 7PM-7AM, please contact night-coverage  05/17/2024, 10:43 AM

## 2024-05-18 DIAGNOSIS — T7840XA Allergy, unspecified, initial encounter: Secondary | ICD-10-CM | POA: Diagnosis not present

## 2024-05-18 DIAGNOSIS — I2699 Other pulmonary embolism without acute cor pulmonale: Secondary | ICD-10-CM

## 2024-05-18 DIAGNOSIS — A419 Sepsis, unspecified organism: Secondary | ICD-10-CM

## 2024-05-18 DIAGNOSIS — B001 Herpesviral vesicular dermatitis: Secondary | ICD-10-CM

## 2024-05-18 DIAGNOSIS — Z8669 Personal history of other diseases of the nervous system and sense organs: Secondary | ICD-10-CM

## 2024-05-18 DIAGNOSIS — L03116 Cellulitis of left lower limb: Secondary | ICD-10-CM | POA: Diagnosis not present

## 2024-05-18 MED ORDER — ACETAMINOPHEN 325 MG PO TABS: 650.0000 mg | ORAL_TABLET | Freq: Four times a day (QID) | ORAL | Status: DC | PRN

## 2024-05-18 MED ORDER — POLYETHYLENE GLYCOL 3350 17 G PO PACK
17.0000 g | PACK | Freq: Two times a day (BID) | ORAL | Status: DC
  Administered 2024-05-19 – 2024-05-22 (×5): 17 g via ORAL
  Filled 2024-05-18 (×7): qty 1

## 2024-05-18 MED ORDER — BISACODYL 5 MG PO TBEC
5.0000 mg | DELAYED_RELEASE_TABLET | Freq: Once | ORAL | Status: AC
  Administered 2024-05-18: 5 mg via ORAL
  Filled 2024-05-18: qty 1

## 2024-05-18 MED ORDER — IBUPROFEN 400 MG PO TABS
600.0000 mg | ORAL_TABLET | Freq: Three times a day (TID) | ORAL | Status: DC | PRN
  Administered 2024-05-18 – 2024-05-22 (×8): 600 mg via ORAL
  Filled 2024-05-18 (×8): qty 2

## 2024-05-18 MED ORDER — LEVOFLOXACIN IN D5W 500 MG/100ML IV SOLN
500.0000 mg | INTRAVENOUS | Status: DC
  Administered 2024-05-19 – 2024-05-20 (×3): 500 mg via INTRAVENOUS
  Filled 2024-05-18 (×3): qty 100

## 2024-05-18 MED ORDER — ACETAMINOPHEN 650 MG RE SUPP: 650.0000 mg | Freq: Four times a day (QID) | RECTAL | Status: DC | PRN

## 2024-05-18 MED ORDER — DIPHENHYDRAMINE HCL 25 MG PO CAPS
50.0000 mg | ORAL_CAPSULE | Freq: Four times a day (QID) | ORAL | Status: DC | PRN
  Administered 2024-05-18 – 2024-05-21 (×4): 50 mg via ORAL
  Filled 2024-05-18 (×4): qty 2

## 2024-05-18 MED ORDER — VALACYCLOVIR HCL 500 MG PO TABS
1000.0000 mg | ORAL_TABLET | Freq: Two times a day (BID) | ORAL | Status: AC
Start: 2024-05-18 — End: 2024-05-18
  Administered 2024-05-18 (×2): 1000 mg via ORAL
  Filled 2024-05-18 (×2): qty 2

## 2024-05-18 MED ORDER — VANCOMYCIN HCL 2000 MG/400ML IV SOLN
2000.0000 mg | INTRAVENOUS | Status: DC
  Administered 2024-05-18 – 2024-05-19 (×2): 2000 mg via INTRAVENOUS
  Filled 2024-05-18 (×3): qty 400

## 2024-05-18 MED ORDER — HYDROCODONE-ACETAMINOPHEN 5-325 MG PO TABS
1.0000 | ORAL_TABLET | ORAL | Status: DC | PRN
  Administered 2024-05-18 – 2024-05-22 (×19): 2 via ORAL
  Filled 2024-05-18 (×19): qty 2

## 2024-05-18 MED ORDER — SODIUM CHLORIDE 0.9 % IV SOLN
2.0000 g | Freq: Every day | INTRAVENOUS | Status: DC
  Administered 2024-05-18: 2 g via INTRAVENOUS
  Filled 2024-05-18: qty 20

## 2024-05-18 MED ORDER — POLYETHYLENE GLYCOL 3350 17 G PO PACK
17.0000 g | PACK | Freq: Every day | ORAL | Status: DC
  Administered 2024-05-18: 17 g via ORAL
  Filled 2024-05-18: qty 1

## 2024-05-18 NOTE — Evaluation (Signed)
 Physical Therapy Evaluation Patient Details Name: Danielle Browning MRN: [id number] DOB: 1975/11/19 Today's Date: 05/18/2024  History of Present Illness  Danielle Browning is a 48 y.o. female with medical history significant of remote history of DVT and PE, contact dermatitis, morbid obesity, presented with worsening of left leg rash swelling and pain.  Clinical Impression  Pt is a pleasant 48 year old female who was admitted for LLE pain. Prior to hospitalization, pt did not require AD for ambulation and was independent with ADLs. Pt performs bed mobility with mod I. Pt requires use of bed features to sit at EOB, however is able to do so without physical assistance. Pt performs transfers with min A for lift from the bed and use of RW. Pt able to transfer to recliner with CGA with use of RW. Heavy reliance on RW for support. Pt reports decreased in pain post session.  Pt demonstrates all bed mobility/transfers/ambulation at baseline level. Pt does not require any further PT needs at this time. Pt will be dc in house and does not require follow up. RN aware. Will dc current orders. Pt encouraged to work with mobility specialists during her acute stay/        If plan is discharge home, recommend the following: A little help with walking and/or transfers;Help with stairs or ramp for entrance   Can travel by private vehicle        Equipment Recommendations Rolling walker (2 wheels)  Recommendations for Other Services       Functional Status Assessment Patient has not had a recent decline in their functional status     Precautions / Restrictions Precautions Precautions: Fall Recall of Precautions/Restrictions: Intact Restrictions Weight Bearing Restrictions Per Provider Order: No      Mobility  Bed Mobility Overal bed mobility: Modified Independent             General bed mobility comments: Use of bed features for supine>sit at EOB. Increaesd time needed, but no physical assistance     Transfers Overall transfer level: Needs assistance Equipment used: Rolling walker (2 wheels) Transfers: Sit to/from Stand, Bed to chair/wheelchair/BSC Sit to Stand: Min assist Stand pivot transfers: Contact guard assist         General transfer comment: min A with lift from EOB.    Ambulation/Gait Ambulation/Gait assistance: Contact guard assist Gait Distance (Feet): 5 Feet Assistive device: Rolling walker (2 wheels) Gait Pattern/deviations: Step-to pattern, Decreased weight shift to left Gait velocity: decreased     General Gait Details: Decreased weight shift to L d/t inc in pain. Heavy reliance on RW for balance/support  Stairs            Wheelchair Mobility     Tilt Bed    Modified Rankin (Stroke Patients Only)       Balance Overall balance assessment: No apparent balance deficits (not formally assessed)                                           Pertinent Vitals/Pain Pain Assessment Pain Assessment: Faces Faces Pain Scale: Hurts little more Pain Location: back and hip Pain Descriptors / Indicators: Aching Pain Intervention(s): Limited activity within patient's tolerance    Home Living Family/patient expects to be discharged to:: Private residence Living Arrangements: Spouse/significant other Available Help at Discharge: Family Type of Home: House Home Access: Stairs to enter Entrance Stairs-Rails: Right;Left;Can reach both  Entrance Stairs-Number of Steps: 4-5   Home Layout: One level Home Equipment: None      Prior Function Prior Level of Function : Independent/Modified Independent             Mobility Comments: Independent with mobility. No AD needed ADLs Comments: independent     Extremity/Trunk Assessment   Upper Extremity Assessment Upper Extremity Assessment: Overall WFL for tasks assessed    Lower Extremity Assessment Lower Extremity Assessment: Generalized weakness    Cervical / Trunk  Assessment Cervical / Trunk Assessment: Normal  Communication   Communication Communication: No apparent difficulties    Cognition Arousal: Alert Behavior During Therapy: WFL for tasks assessed/performed   PT - Cognitive impairments: No apparent impairments                       PT - Cognition Comments: pleasant and agreeable to PT session Following commands: Intact       Cueing Cueing Techniques: Verbal cues     General Comments      Exercises     Assessment/Plan    PT Assessment Patient does not need any further PT services  PT Problem List         PT Treatment Interventions      PT Goals (Current goals can be found in the Care Plan section)  Acute Rehab PT Goals Patient Stated Goal: to go home PT Goal Formulation: With patient Time For Goal Achievement: 06/01/24 Potential to Achieve Goals: Good    Frequency       Co-evaluation               AM-PAC PT 6 Clicks Mobility  Outcome Measure Help needed turning from your back to your side while in a flat bed without using bedrails?: None Help needed moving from lying on your back to sitting on the side of a flat bed without using bedrails?: A Little Help needed moving to and from a bed to a chair (including a wheelchair)?: None Help needed standing up from a chair using your arms (e.g., wheelchair or bedside chair)?: A Little Help needed to walk in hospital room?: None Help needed climbing 3-5 steps with a railing? : A Little 6 Click Score: 21    End of Session Equipment Utilized During Treatment: Gait belt Activity Tolerance: Patient limited by pain Patient left: in chair;with call bell/phone within reach Nurse Communication: Mobility status PT Visit Diagnosis: Difficulty in walking, not elsewhere classified (R26.2)    Time: 8884-8872 PT Time Calculation (min) (ACUTE ONLY): 12 min   Charges:                 Kauri Garson, SPT   Sashay Felling 05/18/2024, 12:25 PM

## 2024-05-18 NOTE — Hospital Course (Addendum)
 48 y.o. female with medical history significant of remote history of DVT and PE, contact dermatitis, morbid obesity, presented with worsening of left leg rash swelling and pain, found to have small right lower lobe PE, has been started on IV antibiotics and eliquis . She does have prior h/o PE after right ankle fracture, 15 years back   9/12.  Patient having headache, cold sore left lip, severe pain in her left leg and hardly able to walk.  Complains of constipation. 9/13.  Patient still having pain on her left leg, also complained of anxiety and depression and more cold sores coming up.  Appreciate psychiatric consultation. 9/14.  Changed vancomycin  over to clindamycin .  Still having quite a bit of pain in her left lower extremity. 9/15.  New onset diabetes with hemoglobin A1c up at 8.2.  Low carbohydrate diet discussed.  MRI shows moderate generalized subcutaneous edema and possible mild enhancement of both lower legs as can be seen with cellulitis.  No deep-seated fluid collection.  No osteomyelitis.  Case discussed with infectious disease specialist to see.

## 2024-05-18 NOTE — Plan of Care (Signed)

## 2024-05-18 NOTE — Assessment & Plan Note (Signed)
 Present on admission with tachycardia and leukocytosis and left lower extremity cellulitis.

## 2024-05-18 NOTE — Assessment & Plan Note (Deleted)
BMI 60.4

## 2024-05-18 NOTE — Assessment & Plan Note (Addendum)
 Patient had another cold sore develop.  Was given Valtrex  1000 mg twice a day on 9/12.  Completed 3 days of 500 mg twice daily Valtrex .

## 2024-05-18 NOTE — Progress Notes (Signed)
       CROSS COVER NOTE  NAME: Danielle Browning MRN: [id number] DOB : 06-10-1976    Concern as stated by nurse / staff   Patient developed rash and itching at the site of prior infusion and has developed itching in the area and on the back     Pertinent findings on chart review: Patient admitted for sepsis secondary to cellulitis On Rocephin  and vancomycin   Patient Assessment Patient seen at bedside. States she has a history of multiple allergies as well as allergy to penicillin as a baby when she  almost died    May 24, 2024    8:16 PM 05-24-2024    3:29 PM 2024/05/24    8:54 AM  Vitals with BMI  Systolic 116 120 839  Diastolic 57 58 97  Pulse 83 83 87   Physical Exam Skin:    Comments: Erythematopapular, non urticarial exam done site of infusion right wrist and forearm also on back  Neurological:     Mental Status: She is alert.         Assessment and  Interventions   Assessment:  Rash, suspect secondary to Rocephin  History of severe penicillin allergy among other allergies  Plan: Discussed with pharmacist Danielle Browning.  Based on timing, unlikely red man syndrome from vancomycin  and more suspicious for reaction to Rocephin  Benadryl  for itch DC Rocephin (recommended on open evidence) and replace with Levaquin  as recommended by pharmacy Changes discussed with patient      CRITICAL CARE Performed by: Danielle Browning   Total critical care time: 35 minutes  Critical care time was exclusive of separately billable procedures and treating other patients.  Critical care was necessary to treat or prevent imminent or life-threatening deterioration.  Critical care was time spent personally by me on the following activities: development of treatment plan with patient and/or surrogate as well as nursing, discussions with consultants, evaluation of patient's response to treatment, examination of patient, obtaining history from patient or surrogate, ordering and performing  treatments and interventions, ordering and review of laboratory studies, ordering and review of radiographic studies, pulse oximetry and re-evaluation of patient's condition.

## 2024-05-18 NOTE — Assessment & Plan Note (Signed)
 Will need outpatient sleep study.  Will get overnight oximetry.

## 2024-05-18 NOTE — Progress Notes (Signed)
  Progress Note   Patient: Danielle Browning FMW:[id number] DOB: 07-07-1976 DOA: 05/15/2024     3 DOS: the patient was seen and examined on 05/18/2024   Brief hospital course: 48 y.o. female with medical history significant of remote history of DVT and PE, contact dermatitis, morbid obesity, presented with worsening of left leg rash swelling and pain, found to have small right lower lobe PE, has been started on IV antibiotics and eliquis . She does have prior h/o PE after right ankle fracture, 15 years back   9/12.  Patient having headache, cold sore left lip, severe pain in her left leg and hardly able to walk.  Complains of constipation.  Assessment and Plan: * Cellulitis Left lower extremity.  Patient was supposed to be switched over to oral antibiotics today but I will switch back to IV vancomycin  and Rocephin .  Clinical sepsis (HCC) Present on admission with tachycardia and leukocytosis and left lower extremity cellulitis.  Acute pulmonary embolism (HCC) Started on Eliquis .  Left lower extremity sonogram negative.  Cold sore Valtrex  x 2 doses  History of sleep apnea Will need outpatient sleep study.  Will get overnight oximetry.  Headache Tylenol  and ibuprofen   Morbid obesity (HCC) BMI 60.4        Subjective: Patient complains of severe pain in left lower extremity, constipation, headache, cold sore, weakness.  Admitted with cellulitis  Physical Exam: Vitals:   05/17/24 2047 05/18/24 0544 05/18/24 0854 05/18/24 1529  BP: 123/67 124/69 (!) 160/97 (!) 120/58  Pulse: 79 89 87 83  Resp: 18 17 17 18   Temp: 98.6 F (37 C) 98.4 F (36.9 C) 99.7 F (37.6 C) 98.2 F (36.8 C)  TempSrc:   Oral   SpO2: 93% 96% 96% 96%  Weight:      Height:       Physical Exam HENT:     Head: Normocephalic.  Eyes:     General: Lids are normal.  Cardiovascular:     Rate and Rhythm: Normal rate and regular rhythm.     Heart sounds: Normal heart sounds, S1 normal and S2 normal.  Pulmonary:      Breath sounds: No decreased breath sounds, wheezing, rhonchi or rales.  Abdominal:     Palpations: Abdomen is soft.     Tenderness: There is no abdominal tenderness.  Musculoskeletal:     Right lower leg: No swelling.     Left lower leg: No swelling.  Skin:    General: Skin is warm.     Comments: Left lower extremity circumferential erythema with pain to light touch.  Neurological:     Mental Status: She is alert and oriented to person, place, and time.     Data Reviewed: Blood count 9.0, hemoglobin 12.1, creatinine 0.77  Family Communication: Family at bedside  Disposition: Status is: Inpatient Change antibiotics back to IV  Planned Discharge Destination: Home    Time spent: 28 minutes  Author: Charlie Patterson, MD 05/18/2024 3:47 PM  For on call review [email or web address].

## 2024-05-18 NOTE — Progress Notes (Signed)
 Pharmacy Antibiotic Note  Danielle Browning is a 48 y.o. 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  Pharmacy has been consulted for vancomycin  dosing.  9/9 DG Tibia/Fibula L: diffuse subcutaneous soft tissue edema, consistent with cellulitis  Patient previously on vancomycin  1000 mg IV q12h x ~48h. Plan was to transition patient to PO antibiotics today; however, after MD evaluated patient, he thinks she could benefit from continue IV antibiotics due to redness of her leg. Vancomycin  restarted with the addition of ceftriaxone .  Given patient's noted penicillin allergy, spoke with patient about allergy and use of cephalosporins to treat her cellulitis. Per patient, her reaction to penicillin occurred when she was an infant during a hospitalization for PNA. She was told by her mother that her throat swelled up, causing difficulty breathing. Unsure of treatment she received for the reaction. To her knowledge, she has never taken amoxicillin, Augmentin, or any cephalosporin in the past. Given limited oral options to treat her cellulitis with her noted allergies, asked patient if she would be willing to try a cephalosporin. She agreed as long as she could take it while inpatient in case she were to have an allergic reaction.   Plan: Start vancomycin  2000 mg IV Q24H. Goal AUC 400-550. Expected AUC: 483.2 Expected Css min: 10.5 SCr used: 0.8  Weight used: IBW, Vd used: 0.5 (BMI 60.4) Start ceftriaxone  2 g IV Q24H per MD Continue to monitor renal function and follow culture results Continue to monitor for improvement of symptoms and appropriate transition to PO antibiotics    Height: 5' 1 (154.9 cm) Weight: (!) 145 kg (319 lb 10.7 oz) IBW/kg (Calculated) : 47.8  Temp (24hrs), Avg:98.3 F (36.8 C), Min:98.1 F (36.7 C), Max:98.6 F (37 C)  Recent Labs  Lab 05/15/24 1024 05/15/24 1949 05/16/24 0134  WBC  12.7*  --  9.0  CREATININE 0.68  --  0.77  LATICACIDVEN  --  1.0 1.1    Estimated Creatinine Clearance: 115.1 mL/min (by C-G formula based on SCr of 0.77 mg/dL).    Allergies  Allergen Reactions   Penicillins Anaphylaxis   Other Swelling    Ant bites   Fruit & Vegetable Daily [Nutritional Supplements] Other (See Comments)    Fruits with peels cause blisters in mouth, especially peaches. She can take cooked fruit and supplements.   Sulfa Antibiotics Hives and Itching    Antimicrobials this admission: 09/09 vancomycin  >>  09/09 doxycycline  x 1     Microbiology results: 09/09 BCx: pending 09/09 MRSA PCR: not detected  Thank you for allowing pharmacy to be a part of this patient's care.  Lum VEAR Mania, PharmD, BCPS 05/18/2024 8:50 AM

## 2024-05-18 NOTE — Assessment & Plan Note (Signed)
 Tylenol and ibuprofen.

## 2024-05-18 NOTE — Assessment & Plan Note (Signed)
 Started on Eliquis .  Left lower extremity sonogram negative.

## 2024-05-18 NOTE — Assessment & Plan Note (Addendum)
 Left lower extremity with severe discomfort even to light touch.  Pain control.  Will get ID consultation.  Currently on Levaquin  and clindamycin .  MRI does not show any osteomyelitis or deeper seeded infection.  Show subcutaneous edema.

## 2024-05-19 DIAGNOSIS — F331 Major depressive disorder, recurrent, moderate: Secondary | ICD-10-CM | POA: Diagnosis not present

## 2024-05-19 DIAGNOSIS — A419 Sepsis, unspecified organism: Secondary | ICD-10-CM | POA: Diagnosis not present

## 2024-05-19 DIAGNOSIS — F329 Major depressive disorder, single episode, unspecified: Secondary | ICD-10-CM

## 2024-05-19 DIAGNOSIS — L03116 Cellulitis of left lower limb: Secondary | ICD-10-CM | POA: Diagnosis not present

## 2024-05-19 DIAGNOSIS — I2690 Septic pulmonary embolism without acute cor pulmonale: Secondary | ICD-10-CM | POA: Diagnosis not present

## 2024-05-19 DIAGNOSIS — F321 Major depressive disorder, single episode, moderate: Secondary | ICD-10-CM

## 2024-05-19 LAB — CBC
HCT: 36.3 % (ref 36.0–46.0)
Hemoglobin: 12.4 g/dL (ref 12.0–15.0)
MCH: 28.7 pg (ref 26.0–34.0)
MCHC: 34.2 g/dL (ref 30.0–36.0)
MCV: 84 fL (ref 80.0–100.0)
Platelets: 372 K/uL (ref 150–400)
RBC: 4.32 MIL/uL (ref 3.87–5.11)
RDW: 12.5 % (ref 11.5–15.5)
WBC: 6.9 K/uL (ref 4.0–10.5)
nRBC: 0 % (ref 0.0–0.2)

## 2024-05-19 LAB — SEDIMENTATION RATE: Sed Rate: 78 mm/h — ABNORMAL HIGH (ref 0–30)

## 2024-05-19 MED ORDER — VALACYCLOVIR HCL 500 MG PO TABS
500.0000 mg | ORAL_TABLET | Freq: Two times a day (BID) | ORAL | Status: AC
  Administered 2024-05-19 – 2024-05-21 (×6): 500 mg via ORAL
  Filled 2024-05-19 (×6): qty 1

## 2024-05-19 MED ORDER — ALPRAZOLAM 0.5 MG PO TABS
0.5000 mg | ORAL_TABLET | ORAL | Status: AC
  Administered 2024-05-19: 0.5 mg via ORAL
  Filled 2024-05-19: qty 1

## 2024-05-19 MED ORDER — SERTRALINE HCL 50 MG PO TABS
50.0000 mg | ORAL_TABLET | Freq: Every day | ORAL | Status: DC
  Administered 2024-05-20 – 2024-05-21 (×2): 50 mg via ORAL
  Filled 2024-05-19 (×2): qty 1

## 2024-05-19 NOTE — Assessment & Plan Note (Signed)
 Psychiatry changed Cymbalta  over to Zoloft .

## 2024-05-19 NOTE — Progress Notes (Signed)
 Pharmacy Antibiotic Note  Danielle Browning is a 48 y.o. 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  Pharmacy has been consulted for vancomycin  dosing.  9/9 DG Tibia/Fibula L: diffuse subcutaneous soft tissue edema, consistent with cellulitis  Patient previously on vancomycin  1000 mg IV q12h x ~48h. Plan was to transition patient to PO antibiotics today; however, after MD evaluated patient, he thinks she could benefit from continue IV antibiotics due to redness of her leg. Vancomycin  restarted with the addition of ceftriaxone .  Given patient's noted penicillin allergy, spoke with patient about allergy and use of cephalosporins to treat her cellulitis. Per patient, her reaction to penicillin occurred when she was an infant during a hospitalization for PNA. She was told by her mother that her throat swelled up, causing difficulty breathing. Unsure of treatment she received for the reaction. To her knowledge, she has never taken amoxicillin, Augmentin, or any cephalosporin in the past. Given limited oral options to treat her cellulitis with her noted allergies, asked patient if she would be willing to try a cephalosporin. She agreed as long as she could take it while inpatient in case she were to have an allergic reaction.   UPDATE :  9/12 -  RN stated pt had developed rash along forearm and on back which began sometime 9/12 AM and grew worse throughout the day.   Does not fit the classical presentation for Redman's syndrome from Vancomycin , which usually occurs on neck, torso, face.   Ceftriaxone  has < 1 % cross reactivity with PCN as they do not share similar side chains.  Nonetheless, could be unique allergy to ceftriaxone .  Unclear source of rash.  Consulted MD who agreed to switch ceftriaxone  to levaquin .   Even though rash does not match typical redman syndrome, will decrease vancomycin  infusion rate to 100 ml/hr in  case her presentation is atypical.    Plan: Start vancomycin  2000 mg IV Q24H. Goal AUC 400-550. Expected AUC: 483.2 Expected Css min: 10.5 SCr used: 0.8  Weight used: IBW, Vd used: 0.5 (BMI 60.4) Start ceftriaxone  2 g IV Q24H per MD Continue to monitor renal function and follow culture results Continue to monitor for improvement of symptoms and appropriate transition to PO antibiotics    Height: 5' 1 (154.9 cm) Weight: (!) 145 kg (319 lb 10.7 oz) IBW/kg (Calculated) : 47.8  Temp (24hrs), Avg:98.6 F (37 C), Min:98.1 F (36.7 C), Max:99.7 F (37.6 C)  Recent Labs  Lab 05/15/24 1024 05/15/24 1949 05/16/24 0134  WBC 12.7*  --  9.0  CREATININE 0.68  --  0.77  LATICACIDVEN  --  1.0 1.1    Estimated Creatinine Clearance: 115.1 mL/min (by C-G formula based on SCr of 0.77 mg/dL).    Allergies  Allergen Reactions   Penicillins Anaphylaxis   Other Swelling    Ant bites   Fruit & Vegetable Daily [Nutritional Supplements] Other (See Comments)    Fruits with peels cause blisters in mouth, especially peaches. She can take cooked fruit and supplements.   Sulfa Antibiotics Hives and Itching    Antimicrobials this admission: 09/09 vancomycin  >>  09/09 doxycycline  x 1     Microbiology results: 09/09 BCx: pending 09/09 MRSA PCR: not detected  Thank you for allowing pharmacy to be a part of this patient's care.  Austina Constantin D, PharmD, BCPS 05/19/2024 2:19 AM

## 2024-05-19 NOTE — Progress Notes (Signed)
 Progress Note   Patient: Danielle Browning FMW:[id number] DOB: [birth year] DOA: 05/15/2024     4 DOS: the patient was seen and examined on 05/19/2024   Brief hospital course: 48 y.o. female with medical history significant of remote history of DVT and PE, contact dermatitis, morbid obesity, presented with worsening of left leg rash swelling and pain, found to have small right lower lobe PE, has been started on IV antibiotics and eliquis . She does have prior h/o PE after right ankle fracture, 15 years back   9/12.  Patient having headache, cold sore left lip, severe pain in her left leg and hardly able to walk.  Complains of constipation.  Assessment and Plan: * Cellulitis Left lower extremity.  Patient on vancomycin  and Levaquin .  White blood cell count normalized.  Still having severe pain.  Elevation.  Clinical sepsis (HCC) Present on admission with tachycardia and leukocytosis and left lower extremity cellulitis.  Acute pulmonary embolism (HCC) Started on Eliquis .  Left lower extremity sonogram negative.  Current moderate episode of major depressive disorder (HCC) 1 dose of Xanax  given earlier.  Psychiatry changed Cymbalta  over to Zoloft .  Cold sore Patient had another cold sore develop.  Was given Valtrex  1000 mg twice a day on 9/12.  Will give Valtrex  500 mg twice daily for 3 more days.  History of sleep apnea Overnight oximetry in paper chart shows desaturations under 88%.  Will give oxygen at night and set up for home oxygen.  Will need an outpatient sleep study.  Headache Tylenol  and ibuprofen   Morbid obesity (HCC) BMI 60.4        Subjective: Patient with a very labile mood today.  Feels like she has expendable.  Asked to speak with psychiatry.  Has another fever blister coming up.  Severe pain in her left lower extremity.  Physical Exam: Vitals:   05/18/24 0854 05/18/24 1529 05/18/24 2016 05/19/24 0845  BP: (!) 160/97 (!) 120/58 (!) 116/57 (!) 100/59  Pulse: 87 83 83  89  Resp: 17 18 16 18   Temp: 99.7 F (37.6 C) 98.2 F (36.8 C) 98.1 F (36.7 C) 98.1 F (36.7 C)  TempSrc: Oral     SpO2: 96% 96% 95% 93%  Weight:      Height:       Physical Exam HENT:     Head: Normocephalic.     Comments: Fever blisters lower lip left side Eyes:     General: Lids are normal.  Cardiovascular:     Rate and Rhythm: Normal rate and regular rhythm.     Heart sounds: Normal heart sounds, S1 normal and S2 normal.  Pulmonary:     Breath sounds: No decreased breath sounds, wheezing, rhonchi or rales.  Abdominal:     Palpations: Abdomen is soft.     Tenderness: There is no abdominal tenderness.  Musculoskeletal:     Right lower leg: No swelling.     Left lower leg: No swelling.  Skin:    General: Skin is warm.     Comments: Left lower extremity circumferential erythema with pain to light touch.   Neurological:     Mental Status: She is alert and oriented to person, place, and time.  Psychiatric:        Mood and Affect: Affect is labile.     Data Reviewed: CBC normal range  Family Communication: Family at bedside  Disposition: Status is: Inpatient Remains inpatient appropriate because: Continue IV antibiotics for severe pain left lower extremity  Planned Discharge  Destination: Home    Time spent: 28 minutes  Author: Charlie Patterson, MD 05/19/2024 2:08 PM  For on call review [email or web address].

## 2024-05-19 NOTE — Plan of Care (Signed)
  Problem: Clinical Measurements: Goal: Will remain free from infection Outcome: Progressing Goal: Diagnostic test results will improve Outcome: Progressing Goal: Respiratory complications will improve Outcome: Progressing   Problem: Nutrition: Goal: Adequate nutrition will be maintained Outcome: Progressing   Problem: Coping: Goal: Level of anxiety will decrease Outcome: Progressing   Problem: Elimination: Goal: Will not experience complications related to urinary retention Outcome: Progressing   Problem: Safety: Goal: Ability to remain free from injury will improve Outcome: Progressing   Problem: Skin Integrity: Goal: Risk for impaired skin integrity will decrease Outcome: Progressing   Problem: Skin Integrity: Goal: Skin integrity will improve Outcome: Progressing

## 2024-05-19 NOTE — Progress Notes (Signed)
 Transition of Care [facility]) - Inpatient Brief Assessment   Patient Details  Name: Danielle Browning MRN: [id number] Date of Birth: 09-10-1975  [facility]) CM/SW Contact:    Victory Jackquline RAMAN, RN Phone Number: 05/19/2024, 6:22 PM   Clinical Narrative:  RNCM reviewed chart, no TOC needs at this time. Will continue to follow for D/C Planning/Care Coordination and update as applicable.    Transition of Care Asessment: Insurance and Status: (P) Insurance coverage has been reviewed Patient has primary care physician: (P) Yes Home environment has been reviewed: (P) Single Family Home Prior level of function:: (P) Not Documented Prior/Current Home Services: (P) No current home services Social Drivers of Health Review: (P) SDOH reviewed no interventions necessary Readmission risk has been reviewed: (P) Yes Transition of care needs: (P) no transition of care needs at this time
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
## 2024-05-20 DIAGNOSIS — A419 Sepsis, unspecified organism: Secondary | ICD-10-CM | POA: Diagnosis not present

## 2024-05-20 DIAGNOSIS — G441 Vascular headache, not elsewhere classified: Secondary | ICD-10-CM

## 2024-05-20 DIAGNOSIS — L03116 Cellulitis of left lower limb: Secondary | ICD-10-CM | POA: Diagnosis not present

## 2024-05-20 DIAGNOSIS — B001 Herpesviral vesicular dermatitis: Secondary | ICD-10-CM | POA: Diagnosis not present

## 2024-05-20 DIAGNOSIS — I2699 Other pulmonary embolism without acute cor pulmonale: Secondary | ICD-10-CM | POA: Diagnosis not present

## 2024-05-20 MED ORDER — CLINDAMYCIN PHOSPHATE 600 MG/50ML IV SOLN
600.0000 mg | Freq: Three times a day (TID) | INTRAVENOUS | Status: DC
  Administered 2024-05-20 – 2024-05-21 (×4): 600 mg via INTRAVENOUS
  Filled 2024-05-20 (×4): qty 50

## 2024-05-20 MED ORDER — MORPHINE SULFATE (PF) 2 MG/ML IV SOLN
1.0000 mg | INTRAVENOUS | Status: DC | PRN
  Administered 2024-05-20 – 2024-05-21 (×5): 1 mg via INTRAVENOUS
  Filled 2024-05-20 (×5): qty 1

## 2024-05-20 MED ORDER — CLINDAMYCIN HCL 150 MG PO CAPS
300.0000 mg | ORAL_CAPSULE | Freq: Four times a day (QID) | ORAL | Status: DC
  Filled 2024-05-20 (×2): qty 2

## 2024-05-20 NOTE — Progress Notes (Signed)
 Progress Note   Patient: Danielle Browning FMW:[id number] DOB: [birth year] DOA: 05/15/2024     5 DOS: the patient was seen and examined on 05/20/2024   Brief hospital course: 48 y.o. female with medical history significant of remote history of DVT and PE, contact dermatitis, morbid obesity, presented with worsening of left leg rash swelling and pain, found to have small right lower lobe PE, has been started on IV antibiotics and eliquis . She does have prior h/o PE after right ankle fracture, 15 years back   9/12.  Patient having headache, cold sore left lip, severe pain in her left leg and hardly able to walk.  Complains of constipation. 9/13.  Patient still having pain on her left leg, also complained of anxiety and depression and more cold sores coming up.  Appreciate psychiatric consultation. 9/14.  Changed vancomycin  over to clindamycin .  Still having quite a bit of pain in her left lower extremity.  Assessment and Plan: * Cellulitis Left lower extremity.  Continue Levaquin  and switch vancomycin  over to clindamycin .  White blood cell count normalized.  Still having severe pain.  Elevation.  Check CK tomorrow morning.  Needed pain control with IV medications today.  On oral medications.  Clinical sepsis (HCC) Present on admission with tachycardia and leukocytosis and left lower extremity cellulitis.  Acute pulmonary embolism (HCC) Started on Eliquis .  Left lower extremity sonogram negative.  Current moderate episode of major depressive disorder [facility]) Psychiatry changed Cymbalta  over to Zoloft .  Cold sore Patient had another cold sore develop.  Was given Valtrex  1000 mg twice a day on 9/12.  Continue Valtrex  500 mg twice daily for 3 days starting 9/13.  History of sleep apnea Overnight oximetry in paper chart shows desaturations under 88%.  Will give oxygen at night and set up for home oxygen.  Will need an outpatient sleep study.  Headache Tylenol  and ibuprofen   Morbid obesity  (HCC) BMI 60.4        Subjective: Patient feels better than when she came in but still having severe pain left lower extremity.  Admitted with cellulitis.  Physical Exam: Vitals:   05/19/24 1701 05/19/24 2005 05/20/24 0326 05/20/24 0756  BP: 130/71 120/61 (!) 105/54 116/69  Pulse: 80 75 82 74  Resp: 17 18 18 18   Temp: 98.2 F (36.8 C) 98.5 F (36.9 C) 98.6 F (37 C) 97.8 F (36.6 C)  TempSrc: Oral Oral    SpO2: 95% 95% 97% 95%  Weight:      Height:       Physical Exam HENT:     Head: Normocephalic.     Comments: Fever blisters lower lip left side Eyes:     General: Lids are normal.  Cardiovascular:     Rate and Rhythm: Normal rate and regular rhythm.     Heart sounds: Normal heart sounds, S1 normal and S2 normal.  Pulmonary:     Breath sounds: No decreased breath sounds, wheezing, rhonchi or rales.  Abdominal:     Palpations: Abdomen is soft.     Tenderness: There is no abdominal tenderness.  Musculoskeletal:     Right lower leg: No swelling.     Left lower leg: No swelling.  Skin:    General: Skin is warm.     Comments: Left lower extremity circumferential erythema with pain to light touch.   Neurological:     Mental Status: She is alert and oriented to person, place, and time.     Data Reviewed: No new data  today  Family Communication: Family at bedside  Disposition: Status is: Inpatient Remains inpatient appropriate because: Still with severe pain left lower extremity.  Continue Levaquin .  Changed vancomycin  over to clindamycin   Planned Discharge Destination: Home    Time spent: 28 minutes  Author: Charlie Patterson, MD 05/20/2024 1:26 PM  For on call review [email or web address].

## 2024-05-20 NOTE — Plan of Care (Signed)
  Problem: Clinical Measurements: Goal: Will remain free from infection Outcome: Progressing   Problem: Nutrition: Goal: Adequate nutrition will be maintained Outcome: Progressing   Problem: Elimination: Goal: Will not experience complications related to urinary retention Outcome: Progressing   Problem: Safety: Goal: Ability to remain free from injury will improve Outcome: Progressing   Problem: Skin Integrity: Goal: Risk for impaired skin integrity will decrease Outcome: Progressing

## 2024-05-20 NOTE — Plan of Care (Signed)
   Problem: Education: Goal: Knowledge of General Education information will improve Description: Including pain rating scale, medication(s)/side effects and non-pharmacologic comfort measures Outcome: Progressing   Problem: Activity: Goal: Risk for activity intolerance will decrease Outcome: Progressing   Problem: Nutrition: Goal: Adequate nutrition will be maintained Outcome: Progressing

## 2024-05-21 ENCOUNTER — Inpatient Hospital Stay

## 2024-05-21 DIAGNOSIS — K5909 Other constipation: Secondary | ICD-10-CM

## 2024-05-21 DIAGNOSIS — A419 Sepsis, unspecified organism: Secondary | ICD-10-CM | POA: Diagnosis not present

## 2024-05-21 DIAGNOSIS — Z6841 Body Mass Index (BMI) 40.0 and over, adult: Secondary | ICD-10-CM

## 2024-05-21 DIAGNOSIS — L309 Dermatitis, unspecified: Secondary | ICD-10-CM

## 2024-05-21 DIAGNOSIS — K5900 Constipation, unspecified: Secondary | ICD-10-CM | POA: Insufficient documentation

## 2024-05-21 DIAGNOSIS — L409 Psoriasis, unspecified: Secondary | ICD-10-CM | POA: Insufficient documentation

## 2024-05-21 DIAGNOSIS — L03116 Cellulitis of left lower limb: Secondary | ICD-10-CM | POA: Diagnosis not present

## 2024-05-21 DIAGNOSIS — I2699 Other pulmonary embolism without acute cor pulmonale: Secondary | ICD-10-CM | POA: Diagnosis not present

## 2024-05-21 DIAGNOSIS — L301 Dyshidrosis [pompholyx]: Secondary | ICD-10-CM

## 2024-05-21 LAB — CBC
HCT: 35.6 % — ABNORMAL LOW (ref 36.0–46.0)
Hemoglobin: 11.7 g/dL — ABNORMAL LOW (ref 12.0–15.0)
MCH: 28.1 pg (ref 26.0–34.0)
MCHC: 32.9 g/dL (ref 30.0–36.0)
MCV: 85.4 fL (ref 80.0–100.0)
Platelets: 380 K/uL (ref 150–400)
RBC: 4.17 MIL/uL (ref 3.87–5.11)
RDW: 12.3 % (ref 11.5–15.5)
WBC: 8.3 K/uL (ref 4.0–10.5)
nRBC: 0 % (ref 0.0–0.2)

## 2024-05-21 LAB — HEMOGLOBIN A1C
Hgb A1c MFr Bld: 8.2 % — ABNORMAL HIGH (ref 4.8–5.6)
Mean Plasma Glucose: 188.64 mg/dL

## 2024-05-21 LAB — BASIC METABOLIC PANEL WITH GFR
Anion gap: 7 (ref 5–15)
BUN: 8 mg/dL (ref 6–20)
CO2: 29 mmol/L (ref 22–32)
Calcium: 8.5 mg/dL — ABNORMAL LOW (ref 8.9–10.3)
Chloride: 99 mmol/L (ref 98–111)
Creatinine, Ser: 0.78 mg/dL (ref 0.44–1.00)
GFR, Estimated: >60 mL/min (ref >60)
Glucose, Bld: 352 mg/dL — ABNORMAL HIGH (ref 70–99)
Potassium: 4 mmol/L (ref 3.5–5.1)
Sodium: 135 mmol/L (ref 135–145)

## 2024-05-21 LAB — GLUCOSE, CAPILLARY
Glucose-Capillary: 301 mg/dL — ABNORMAL HIGH (ref 70–99)
Glucose-Capillary: 282 mg/dL — ABNORMAL HIGH (ref 70–99)
Glucose-Capillary: 285 mg/dL — ABNORMAL HIGH (ref 70–99)
Glucose-Capillary: 309 mg/dL — ABNORMAL HIGH (ref 70–99)

## 2024-05-21 LAB — SEDIMENTATION RATE: Sed Rate: 63 mm/h — ABNORMAL HIGH (ref 0–30)

## 2024-05-21 LAB — CK: Total CK: 35 U/L — ABNORMAL LOW (ref 38–234)

## 2024-05-21 MED ORDER — LINEZOLID 600 MG PO TABS
600.0000 mg | ORAL_TABLET | Freq: Two times a day (BID) | ORAL | Status: DC
  Administered 2024-05-21 – 2024-05-22 (×2): 600 mg via ORAL
  Filled 2024-05-21 (×2): qty 1

## 2024-05-21 MED ORDER — INSULIN GLARGINE 100 UNIT/ML ~~LOC~~ SOLN
10.0000 [IU] | Freq: Every day | SUBCUTANEOUS | Status: DC
  Administered 2024-05-21: 10 [IU] via SUBCUTANEOUS
  Filled 2024-05-21 (×2): qty 0.1

## 2024-05-21 MED ORDER — DIAZEPAM 5 MG/ML IJ SOLN: 2.5000 mg | Freq: Once | INTRAMUSCULAR | Status: DC | PRN

## 2024-05-21 MED ORDER — GADOBUTROL 1 MMOL/ML IV SOLN
10.0000 mL | Freq: Once | INTRAVENOUS | Status: AC | PRN
  Administered 2024-05-21: 10 mL via INTRAVENOUS

## 2024-05-21 MED ORDER — INSULIN ASPART 100 UNIT/ML IJ SOLN
0.0000 [IU] | Freq: Every day | INTRAMUSCULAR | Status: DC
  Administered 2024-05-21: 4 [IU] via SUBCUTANEOUS
  Filled 2024-05-21: qty 1

## 2024-05-21 MED ORDER — INSULIN ASPART 100 UNIT/ML IJ SOLN
0.0000 [IU] | Freq: Three times a day (TID) | INTRAMUSCULAR | Status: DC
  Administered 2024-05-21 (×2): 3 [IU] via SUBCUTANEOUS
  Administered 2024-05-21 – 2024-05-22 (×2): 4 [IU] via SUBCUTANEOUS
  Administered 2024-05-22: 2 [IU] via SUBCUTANEOUS
  Filled 2024-05-21 (×5): qty 1

## 2024-05-21 MED ORDER — SERTRALINE HCL 50 MG PO TABS
25.0000 mg | ORAL_TABLET | Freq: Every day | ORAL | Status: DC
  Administered 2024-05-22: 25 mg via ORAL
  Filled 2024-05-21: qty 1

## 2024-05-21 NOTE — Plan of Care (Addendum)
  Problem: Education: Goal: Knowledge of General Education information will improve Description: Including pain rating scale, medication(s)/side effects and non-pharmacologic comfort measures Outcome: Progressing   Problem: Clinical Measurements: Goal: Will remain free from infection Outcome: Progressing Goal: Respiratory complications will improve Outcome: Progressing   Problem: Coping: Goal: Level of anxiety will decrease Outcome: Progressing   

## 2024-05-21 NOTE — Progress Notes (Signed)
   05/21/24 1200  Mobility  Activity Ambulated with assistance;Stood at bedside;Respositioned in chair  Level of Assistance Contact guard assist, steadying assist  Assistive Device Front wheel walker  Distance Ambulated (ft) 35 ft  Range of Motion/Exercises Active Assistive  Activity Response Tolerated fair  Mobility Referral Yes  Mobility visit 1 Mobility  Mobility Specialist Start Time (ACUTE ONLY) 1212  Mobility Specialist Stop Time (ACUTE ONLY) 1230  Mobility Specialist Time Calculation (min) (ACUTE ONLY) 18 min   Mobility Specialist - Progress Note  Pt was in the chair upon entry. Pt agreed to mobility, but did state sharp pain feeling on the back heel of her left leg. Pt was able to STS independently. Pt was able to ambulate well to and from the exit door to the bathroom back to the chair with mild pain. Pt was able to return to the chair with feet up and needs in reach.   Clem Rodes Mobility Specialist 05/21/24, 12:46 PM

## 2024-05-21 NOTE — Assessment & Plan Note (Signed)
On MiraLAX °

## 2024-05-21 NOTE — Progress Notes (Signed)
 Pharmacy - Benefit check  Linezolid  prior authorization completed.  Approved and copay is $0   Andrewjames Weirauch, PharmD, BCPS, BCIDP Work Cell: [phone] 05/21/2024 4:45 PM

## 2024-05-21 NOTE — Progress Notes (Signed)
 Progress Note   Patient: Danielle Browning FMW:[id number] DOB: 1976/06/12 DOA: 05/15/2024     6 DOS: the patient was seen and examined on 05/21/2024   Brief hospital course: 48 y.o. female with medical history significant of remote history of DVT and PE, contact dermatitis, morbid obesity, presented with worsening of left leg rash swelling and pain, found to have small right lower lobe PE, has been started on IV antibiotics and eliquis . She does have prior h/o PE after right ankle fracture, 15 years back   9/12.  Patient having headache, cold sore left lip, severe pain in her left leg and hardly able to walk.  Complains of constipation. 9/13.  Patient still having pain on her left leg, also complained of anxiety and depression and more cold sores coming up.  Appreciate psychiatric consultation. 9/14.  Changed vancomycin  over to clindamycin .  Still having quite a bit of pain in her left lower extremity. 9/15.  New onset diabetes with hemoglobin A1c up at 8.2.  Low carbohydrate diet discussed.  MRI shows moderate generalized subcutaneous edema and possible mild enhancement of both lower legs as can be seen with cellulitis.  No deep-seated fluid collection.  No osteomyelitis.  Case discussed with infectious disease specialist to see.  Assessment and Plan: * Cellulitis Left lower extremity with severe discomfort even to light touch.  Pain control.  Will get ID consultation.  Currently on Levaquin  and clindamycin .  MRI does not show any osteomyelitis or deeper seeded infection.  Show subcutaneous edema.  Clinical sepsis (HCC) Present on admission with tachycardia and leukocytosis and left lower extremity cellulitis.  Acute pulmonary embolism (HCC) Started on Eliquis .  Left lower extremity sonogram negative.  Constipation On MiraLAX   Psoriasis Clobetasol  cream  Current moderate episode of major depressive disorder (HCC) Psychiatry changed Cymbalta  over to Zoloft .  Cold sore Patient had another  cold sore develop.  Was given Valtrex  1000 mg twice a day on 9/12.  Continue Valtrex  500 mg twice daily for 3 days starting 9/13.  History of sleep apnea Overnight oximetry in paper chart shows desaturations under 88%.  Will give oxygen at night and set up for home oxygen.  Will need an outpatient sleep study.  Headache Tylenol  and ibuprofen   Morbid obesity (HCC) BMI 60.4        Subjective: Patient seen this morning and again this afternoon.  This afternoon has some itching.  Complains of whole rash right arm and also chest.  Asked for some Benadryl .  Complains of pain in her left lower extremity.  Physical Exam: Vitals:   05/20/24 1621 05/20/24 1931 05/21/24 0420 05/21/24 0808  BP: 124/77 (!) 147/85 (!) 107/55 114/70  Pulse: 76 89 81 74  Resp: 18 19 18 18   Temp: 98.5 F (36.9 C) 98.3 F (36.8 C) 98.2 F (36.8 C) 98.1 F (36.7 C)  TempSrc:      SpO2: 97% 96% 98% 94%  Weight:      Height:       Physical Exam HENT:     Head: Normocephalic.     Comments: Fever blisters lower lip left side Eyes:     General: Lids are normal.  Cardiovascular:     Rate and Rhythm: Normal rate and regular rhythm.     Heart sounds: Normal heart sounds, S1 normal and S2 normal.  Pulmonary:     Breath sounds: No decreased breath sounds, wheezing, rhonchi or rales.  Abdominal:     Palpations: Abdomen is soft.  Tenderness: There is no abdominal tenderness.  Musculoskeletal:     Right lower leg: No swelling.     Left lower leg: No swelling.  Skin:    General: Skin is warm.     Comments: Left lower extremity circumferential erythema with pain to light touch.   Neurological:     Mental Status: She is alert and oriented to person, place, and time.     Data Reviewed: MRI shows moderate generalized subcutaneous edema with possible mild has been both lower extremities against ascending cellulitis. Glucose 352, hemoglobin A1c 8.2, sed rate 63, white blood count 8.3, hemoglobin 11.7,  platelet count 380, creatinine 0.78  Family Communication: Family at bedside this afternoon  Disposition: Status is: Inpatient Remains inpatient appropriate because: Still with severe pain in left lower extremity.  Imaging does not show osteomyelitis or deep-seated abscess.  Will get ID consultation.  Planned Discharge Destination: Home    Time spent: 28 minutes  Author: Charlie Patterson, MD 05/21/2024 3:52 PM  For on call review [email or web address].
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
## 2024-05-21 NOTE — Assessment & Plan Note (Signed)
 Clobetasol cream

## 2024-05-22 ENCOUNTER — Other Ambulatory Visit (HOSPITAL_COMMUNITY): Payer: Self-pay

## 2024-05-22 ENCOUNTER — Telehealth (HOSPITAL_COMMUNITY): Payer: Self-pay | Admitting: Pharmacy Technician

## 2024-05-22 DIAGNOSIS — I2699 Other pulmonary embolism without acute cor pulmonale: Secondary | ICD-10-CM | POA: Diagnosis not present

## 2024-05-22 DIAGNOSIS — L03116 Cellulitis of left lower limb: Secondary | ICD-10-CM | POA: Diagnosis not present

## 2024-05-22 DIAGNOSIS — E1165 Type 2 diabetes mellitus with hyperglycemia: Secondary | ICD-10-CM | POA: Insufficient documentation

## 2024-05-22 DIAGNOSIS — B001 Herpesviral vesicular dermatitis: Secondary | ICD-10-CM | POA: Diagnosis not present

## 2024-05-22 DIAGNOSIS — A419 Sepsis, unspecified organism: Secondary | ICD-10-CM | POA: Diagnosis not present

## 2024-05-22 LAB — GLUCOSE, CAPILLARY
Glucose-Capillary: 198 mg/dL — ABNORMAL HIGH (ref 70–99)
Glucose-Capillary: 326 mg/dL — ABNORMAL HIGH (ref 70–99)

## 2024-05-22 MED ORDER — APIXABAN 5 MG PO TABS: 5.0000 mg | ORAL_TABLET | Freq: Two times a day (BID) | ORAL | 0 refills | Status: DC

## 2024-05-22 MED ORDER — LIVING WELL WITH DIABETES BOOK
Freq: Once | Status: DC
  Filled 2024-05-22: qty 1

## 2024-05-22 MED ORDER — SERTRALINE HCL 25 MG PO TABS: ORAL_TABLET | ORAL | 0 refills | Status: DC

## 2024-05-22 MED ORDER — LINEZOLID 600 MG PO TABS: 600.0000 mg | ORAL_TABLET | Freq: Two times a day (BID) | ORAL | 0 refills | Status: AC

## 2024-05-22 MED ORDER — SACCHAROMYCES BOULARDII 250 MG PO CAPS: 250.0000 mg | ORAL_CAPSULE | Freq: Two times a day (BID) | ORAL | 0 refills | Status: AC

## 2024-05-22 MED ORDER — HYDROCODONE-ACETAMINOPHEN 5-325 MG PO TABS: 1.0000 | ORAL_TABLET | Freq: Four times a day (QID) | ORAL | 0 refills | Status: AC | PRN

## 2024-05-22 MED ORDER — INSULIN STARTER KIT- PEN NEEDLES (ENGLISH)
1.0000 | Freq: Once | Status: DC
  Filled 2024-05-22: qty 1

## 2024-05-22 MED ORDER — INSULIN GLARGINE-YFGN 100 UNIT/ML ~~LOC~~ SOPN: 18.0000 [IU] | PEN_INJECTOR | Freq: Every day | SUBCUTANEOUS | 0 refills | Status: DC

## 2024-05-22 MED ORDER — POLYETHYLENE GLYCOL 3350 17 G PO PACK: 17.0000 g | PACK | Freq: Every day | ORAL | 0 refills | Status: AC

## 2024-05-22 MED ORDER — INSULIN STARTER KIT- PEN NEEDLES (ENGLISH): 1.0000 | Freq: Once | 0 refills | Status: AC

## 2024-05-22 MED ORDER — EMBECTA PEN NEEDLE NANO 32G X 4 MM MISC: 1.0000 | Freq: Every day | 0 refills | Status: AC

## 2024-05-22 NOTE — Discharge Summary (Signed)
 Physician Discharge Summary   Patient: Danielle Browning MRN: [id number] DOB: [birth year]  Admit date:     05/15/2024  Discharge date: 05/22/24  Discharge Physician: Charlie Patterson   PCP: Dineen Rollene MATSU, FNP   Recommendations at discharge:   Follow-up PCP 5 days  Discharge Diagnoses: Principal Problem:   Cellulitis Active Problems:   Clinical sepsis (HCC)   Acute pulmonary embolism (HCC)   Headache   Morbid obesity with BMI of 60.0-69.9, adult (HCC)   History of sleep apnea   Cold sore   Current moderate episode of major depressive disorder (HCC)   Psoriasis   Constipation   Uncontrolled type 2 diabetes mellitus with hyperglycemia, without long-term current use of insulin  [facility])    Hospital Course: 48 y.o. female with medical history significant of remote history of DVT and PE, contact dermatitis, morbid obesity, presented with worsening of left leg rash swelling and pain, found to have small right lower lobe PE, has been started on IV antibiotics and eliquis . She does have prior h/o PE after right ankle fracture, 15 years back   9/12.  Patient having headache, cold sore left lip, severe pain in her left leg and hardly able to walk.  Complains of constipation. 9/13.  Patient still having pain on her left leg, also complained of anxiety and depression and more cold sores coming up.  Appreciate psychiatric consultation. 9/14.  Changed vancomycin  over to clindamycin .  Still having quite a bit of pain in her left lower extremity. 9/15.  New onset diabetes with hemoglobin A1c up at 8.2.  Low carbohydrate diet discussed.  MRI shows moderate generalized subcutaneous edema and possible mild enhancement of both lower legs as can be seen with cellulitis.  No deep-seated fluid collection.  No osteomyelitis.  Case discussed with infectious disease specialist and switched over to Zyvox . 9/16.  Patient feeling little bit better.  Tolerating Zyvox .  Still having pain but wants to go home.   Infectious disease specialist recommended Zyvox  for another 5 days.  Assessment and Plan: * Cellulitis Left lower extremity with severe discomfort even to light touch.  Pain control with oral medications.  MRI does not show any osteomyelitis or deeper seeded infection.  Show subcutaneous edema.  Antibiotics changed over to oral Zyvox  on 9/15.  Patient wanting to get out of the hospital.  Will prescribe Zyvox  for another 5 days as per ID.  Clinical sepsis (HCC) Present on admission with tachycardia and leukocytosis and left lower extremity cellulitis.  Acute pulmonary embolism (HCC) Started on Eliquis .  Left lower extremity sonogram negative.  Uncontrolled type 2 diabetes mellitus with hyperglycemia, without long-term current use of insulin  [facility]) Patient was interested in going home with insulin .  Diabetes coordinator taught her how to inject insulin  and get her set up with [facility] freestyle sensor.  Will give Lantus  insulin  18 units at bedtime.  If sugars still remain in the 200s can go up to 3 units every 3 days.  Patient interested in one of the GLP-1 inhibitors as outpatient.  This will have to be approved with primary care physician.  Constipation On MiraLAX   Psoriasis Clobetasol  cream  Current moderate episode of major depressive disorder [facility]) Psychiatry changed Cymbalta  over to Zoloft .  Cold sore Patient had another cold sore develop.  Was given Valtrex  1000 mg twice a day on 9/12.  Completed 3 days of 500 mg twice daily Valtrex .  History of sleep apnea Overnight oximetry in paper chart shows desaturations under 88%.  Will give oxygen  at night and set up for home oxygen.  Will need an outpatient sleep study.  Morbid obesity with BMI of 60.0-69.9, adult (HCC) BMI 60.4  Headache Tylenol  and ibuprofen          Consultants: Psychiatry, infectious disease Procedures performed: None Disposition: Home Diet recommendation:  Cardiac and Carb modified diet DISCHARGE  MEDICATION: Allergies as of 05/22/2024       Reactions   Penicillins Anaphylaxis   Other Swelling   Ant bites   Fruit & Vegetable Daily [nutritional Supplements] Other (See Comments)   Fruits with peels cause blisters in mouth, especially peaches. She can take cooked fruit and supplements.   Sulfa Antibiotics Hives, Itching   Rocephin  [ceftriaxone ] Rash        Medication List     STOP taking these medications    cyclobenzaprine  10 MG tablet Commonly known as: FLEXERIL    DULoxetine  30 MG capsule Commonly known as: Cymbalta        TAKE these medications    albuterol  108 (90 Base) MCG/ACT inhaler Commonly known as: VENTOLIN  HFA Inhale 2 puffs into the lungs every 6 (six) hours as needed for wheezing or shortness of breath.   apixaban  5 MG Tabs tablet Commonly known as: ELIQUIS  Take 1 tablet (5 mg total) by mouth 2 (two) times daily.   buPROPion  150 MG 24 hr tablet Commonly known as: WELLBUTRIN  XL Take 2 tablets (300 mg total) by mouth daily.   clobetasol  ointment 0.05 % Commonly known as: TEMOVATE  Apply to affected skin qd-bid prn, Avoid applying to face, groin, and axilla. Use as directed. Long-term use can cause thinning of the skin.   Clotrimazole  1 % Oint Apply 1 application topically 2 (two) times daily.   Dupixent  300 MG/2ML Soaj Generic drug: Dupilumab  INJECT 300 MG UNDER THE SKIN EVERY 14 DAYS   Embecta Pen Needle Nano 32G X 4 MM Misc Generic drug: Insulin  Pen Needle 1 Dose by Does not apply route daily.   gabapentin  300 MG capsule Commonly known as: NEURONTIN  TAKE 1 CAPSULE BY MOUTH THREE TIMES A DAY   HYDROcodone -acetaminophen  5-325 MG tablet Commonly known as: NORCO/VICODIN Take 1 tablet by mouth every 6 (six) hours as needed for up to 5 days for severe pain (pain score 7-10).   hydrOXYzine  25 MG tablet Commonly known as: ATARAX  Take 25 mg by mouth 3 (three) times daily.   insulin  glargine-yfgn 100 UNIT/ML Pen Commonly known as:  SEMGLEE  Inject 18 Units into the skin at bedtime.   insulin  starter kit- pen needles Misc 1 kit by Other route once for 1 dose.   levocetirizine 5 MG tablet Commonly known as: XYZAL Take 5 mg by mouth every evening.   linezolid  600 MG tablet Commonly known as: ZYVOX  Take 1 tablet (600 mg total) by mouth every 12 (twelve) hours for 5 days.   meloxicam  15 MG tablet Commonly known as: MOBIC  TAKE 1 TABLET (15 MG TOTAL) BY MOUTH DAILY AS NEEDED FOR PAIN WITH FOOD   mometasone  0.1 % cream Commonly known as: ELOCON  Apply 1 application. topically daily as needed (Rash). Qd up to 5 days a week to eczema on hands and left foot prn flares   mupirocin  ointment 2 % Commonly known as: BACTROBAN  Apply 1 application topically 2 (two) times daily. To lesions on abdomen.   omeprazole  20 MG capsule Commonly known as: PRILOSEC TAKE 1 CAPSULE (20 MG TOTAL) BY MOUTH DAILY. TAKE 30 MINUTES TO ONE HOUR BEFORE BREAKFAST   Opzelura  1.5 % Crea Generic  drug: Ruxolitinib Phosphate  Apply to hands once a day   polyethylene glycol 17 g packet Commonly known as: MIRALAX  / GLYCOLAX  Take 17 g by mouth daily.   saccharomyces boulardii 250 MG capsule Commonly known as: FLORASTOR Take 1 capsule (250 mg total) by mouth 2 (two) times daily.   sertraline  25 MG tablet Commonly known as: ZOLOFT  One tablet daily for 5 days then can go up to 2 tablets daily Start taking on: May 23, 2024   triamcinolone  0.025 % cream Commonly known as: KENALOG  Apply 1 application topically 2 (two) times daily.   VITAMIN D3 PO Take 4,000 Int'l Units by mouth.               Durable Medical Equipment  (From admission, onward)           Start     Ordered   05/19/24 0923  For home use only DME oxygen  Once       Question Answer Comment  Length of Need Lifetime   Mode or (Route) Nasal cannula   Liters per Minute 2   Frequency Only at night (stationary unit needed)   Oxygen delivery system Gas       05/19/24 0923            Follow-up Information     Arnett, Rollene MATSU, FNP. Call today.   Specialty: Family Medicine Why: To follow up Contact information: [address] [place] [address] [place] [address] [phone]         Babara Call, MD Follow up in 4 week(s).   Specialty: Oncology Why: Recurrent PE Contact information: [address] [place] [address] [phone]                Discharge Exam: Fredricka Weights   05/15/24 1020  Weight: (!) 145 kg   Physical Exam HENT:     Head: Normocephalic.     Comments: Fever blisters lower lip left side Eyes:     General: Lids are normal.  Cardiovascular:     Rate and Rhythm: Normal rate and regular rhythm.     Heart sounds: Normal heart sounds, S1 normal and S2 normal.  Pulmonary:     Breath sounds: No decreased breath sounds, wheezing, rhonchi or rales.  Abdominal:     Palpations: Abdomen is soft.     Tenderness: There is no abdominal tenderness.  Musculoskeletal:     Right lower leg: No swelling.     Left lower leg: No swelling.  Skin:    General: Skin is warm.  Neurological:     Mental Status: She is alert and oriented to person, place, and time.         Condition at discharge: stable  The results of significant diagnostics from this hospitalization (including imaging, microbiology, ancillary and laboratory) are listed below for reference.   Imaging Studies: MR TIBIA FIBULA LEFT W WO CONTRAST Result Date: 05/21/2024 CLINICAL DATA:  Worsening left leg rash, swelling and pain. On antibiotics and Eliquis  for pulmonary embolus. History of DVT and contact dermatitis. EXAM: MRI OF LOWER LEFT EXTREMITY WITHOUT AND WITH CONTRAST TECHNIQUE: Multiplanar, multisequence MR imaging of the left lower leg was performed both before and after administration of intravenous contrast. CONTRAST:  10mL GADAVIST  GADOBUTROL  1 MMOL/ML IV SOLN COMPARISON:  Radiographs 05/15/2024. MRI of the left ankle 01/11/2024.  FINDINGS: Bones/Joint/Cartilage Both lower legs are included on the coronal images. There is no evidence of acute fracture, dislocation or osteomyelitis. Mild degenerative changes at  both knees. No significant left knee or ankle joint effusion. Ligaments No significant ligamentous abnormalities are identified on large field-of-view imaging. Muscles and Tendons The left lower leg muscles and tendons appear unremarkable. Soft tissues Moderate generalized subcutaneous edema appears fairly symmetric bilaterally based on the coronal inversion recovery images. Possible mild associated subcutaneous enhancement as can be seen with cellulitis. No organized fluid collection or foreign body identified. There is no evidence of deep soft tissue fluid collection or acute vascular abnormality. IMPRESSION: 1. Moderate generalized subcutaneous edema and possible mild enhancement in both lower legs as can be seen with cellulitis. No evidence of abscess, deep soft tissue infection or osteomyelitis. 2. No acute osseous findings. Electronically Signed   By: Elsie Perone M.D.   On: 05/21/2024 14:34   DG Tibia/Fibula Left Result Date: 05/15/2024 EXAM: 3 VIEW(S) XRAY OF THE LEFT TIBIA AND FIBULA 05/15/2024 03:17:58 PM COMPARISON: None available. CLINICAL HISTORY: 07680 Cellulitis [id number]. Cellulitis FINDINGS: BONES AND JOINTS: No acute fracture. No focal osseous lesion. No joint dislocation. Degenerative changes are noted within the left knee. SOFT TISSUES: Diffuse subcutaneous soft tissue edema. IMPRESSION: 1. Diffuse subcutaneous soft tissue edema, consistent with cellulitis. 2. Degenerative changes in the left knee. Electronically signed by: Waddell Calk MD 05/15/2024 03:54 PM EDT RP Workstation: [id number]   CT Angio Chest PE W/Cm &/Or Wo Cm Addendum Date: 05/15/2024 ADDENDUM REPORT: 05/15/2024 13:39 ADDENDUM: Critical Value/emergent results were called by telephone at the time of interpretation on 05/15/2024 at 1:39 pm to provider  TING TAN , who verbally acknowledged these results. Electronically Signed   By: Ree Molt M.D.   On: 05/15/2024 13:39   Result Date: 05/15/2024 CLINICAL DATA:  Pulmonary embolism (PE) suspected, high prob. Left lower leg redness and swelling. Shortness of breath. EXAM: CT ANGIOGRAPHY CHEST WITH CONTRAST TECHNIQUE: Multidetector CT imaging of the chest was performed using the standard protocol during bolus administration of intravenous contrast. Multiplanar CT image reconstructions and MIPs were obtained to evaluate the vascular anatomy. RADIATION DOSE REDUCTION: This exam was performed according to the departmental dose-optimization program which includes automated exposure control, adjustment of the mA and/or kV according to patient size and/or use of iterative reconstruction technique. CONTRAST:  75mL OMNIPAQUE  IOHEXOL  350 MG/ML SOLN COMPARISON:  None Available. FINDINGS: Cardiovascular: Examination is limited due to suboptimal contrast opacification. There is very small volume nonocclusive thrombus in the right lung lower lobe proximal subsegmental pulmonary artery branch (series 5, image 162). No associated right heart strain or lung infarction. Normal cardiac size. No pericardial effusion. No aortic aneurysm. Mediastinum/Nodes: Visualized thyroid  gland appears grossly unremarkable. No solid / cystic mediastinal masses. The esophagus is nondistended precluding optimal assessment. No axillary, mediastinal or hilar lymphadenopathy by size criteria. Lungs/Pleura: The central tracheo-bronchial tree is patent. No mass or consolidation. No pleural effusion or pneumothorax. No suspicious lung nodules. Upper Abdomen: Visualized upper abdominal viscera within normal limits. Musculoskeletal: The visualized soft tissues of the chest wall are grossly unremarkable. No suspicious osseous lesions. There are mild multilevel degenerative changes in the visualized spine. Review of the MIP images confirms the above findings.  IMPRESSION: 1. Very small volume nonocclusive thrombus in the right lung lower lobe proximal subsegmental pulmonary artery branch. No associated right heart strain or lung infarction. 2. No lung mass, consolidation, pleural effusion or pneumothorax. No suspicious lung nodule. 3. Otherwise essentially unremarkable exam, as described above. Electronically Signed: By: Ree Molt M.D. On: 05/15/2024 13:32   US  Venous Img Lower  Left (DVT Study) Result Date:  05/15/2024 EXAM: ULTRASOUND DUPLEX OF THE LEFT LOWER EXTREMITY VEINS TECHNIQUE: Duplex ultrasound using B-mode/gray scaled imaging and Doppler spectral analysis and color flow was obtained of the deep venous structures of the left lower extremity. COMPARISON: None. CLINICAL HISTORY: Swelling. FINDINGS: The visualized veins of the lower extremity are patent and free of echogenic thrombus. The veins demonstrate good compressibility with normal color flow study and spectral analysis. There is limited visualization of the posterior tibial and peroneal veins due to lower extremity edema. IMPRESSION: 1. No evidence of DVT. 2. Limited visualization of the posterior tibial and peroneal veins due to lower extremity edema. Electronically signed by: Waddell Calk MD 05/15/2024 01:03 PM EDT RP Workstation: [id number]   DG Chest 1 View Result Date: 05/15/2024 EXAM: 1 VIEW XRAY OF THE CHEST 05/15/2024 10:50:00 AM COMPARISON: Chest radiograph 05/17/2021. CLINICAL HISTORY: Patient presents with SOB since 1 month worse since last night. Worse with lying flat. LLE is red and swollen. FINDINGS: LUNGS AND PLEURA: Low lung volumes. Right basilar opacities suggestive of atelectasis. No pleural effusion. No pneumothorax. HEART AND MEDIASTINUM: No acute abnormality of the cardiac and mediastinal silhouettes. BONES AND SOFT TISSUES: No acute osseous abnormality. IMPRESSION: 1. Limited evaluation due to underpenetration. 2. Low lung volumes with right basilar opacities suggestive of  atelectasis. Electronically signed by: Donnice Mania MD 05/15/2024 11:31 AM EDT RP Workstation: [id number]    Microbiology: Results for orders placed or performed during the hospital encounter of 05/15/24  MRSA Next Gen by PCR, Nasal     Status: None   Collection Time: 05/15/24  7:49 PM   Specimen: Nasal Mucosa; Nasal Swab  Result Value Ref Range Status   MRSA by PCR Next Gen NOT DETECTED NOT DETECTED Final    Comment: (NOTE) The GeneXpert MRSA Assay (FDA approved for NASAL specimens only), is one component of a comprehensive MRSA colonization surveillance program. It is not intended to diagnose MRSA infection nor to guide or monitor treatment for MRSA infections. Test performance is not FDA approved in patients less than 34 years old. Performed at [facility], [address] [place], [place] [address]     Labs: CBC: Recent Labs  Lab 05/16/24 0134 05/19/24 0636 05/21/24 0458  WBC 9.0 6.9 8.3  HGB 12.1 12.4 11.7*  HCT 35.5* 36.3 35.6*  MCV 84.1 84.0 85.4  PLT 249 372 380   Basic Metabolic Panel: Recent Labs  Lab 05/16/24 0134 05/21/24 0458  NA 136 135  K 3.8 4.0  CL 104 99  CO2 25 29  GLUCOSE 239* 352*  BUN 13 8  CREATININE 0.77 0.78  CALCIUM 8.2* 8.5*   Liver Function Tests: No results for input(s): AST, ALT, ALKPHOS, BILITOT, PROT, ALBUMIN in the last 168 hours. CBG: Recent Labs  Lab 05/21/24 1210 05/21/24 1706 05/21/24 2051 05/22/24 0932 05/22/24 1154  GLUCAP 282* 285* 309* 198* 326*    Discharge time spent: greater than 30 minutes.  Signed: Charlie Patterson, MD [facility] 05/22/2024

## 2024-05-22 NOTE — Progress Notes (Signed)
  RD consulted for nutrition education regarding diabetes.   Lab Results  Component Value Date   HGBA1C 8.2 (H) 05/21/2024   Noted order for discharge.   RD provided Carbohydrate Counting for People with Diabetes and Plate Method handout from the Academy of Nutrition and Dietetics. Attached to AVS/ discharge summary.   DM coordinator met with pt. Plan to discharge home on insulin . CGM attached prior to discharge. Pt also interested in GLP-1.   RD referred pt to [facility]'s Nutrition and Diabetes Education Services.   Body mass index is 60.4 kg/m. Pt meets criteria for obesity, class III based on current BMI. Obesity is a complex, chronic medical condition that is optimally managed by a multidisciplinary care team. Weight loss is not an ideal goal for an acute inpatient hospitalization. However, if further work-up for obesity is warranted, consider outpatient referral to [facility].    Current diet order is carb modified, patient is consuming approximately 100% of meals at this time. Labs and medications reviewed. No further nutrition interventions warranted at this time. RD contact information provided. If additional nutrition issues arise, please re-consult RD.  Margery ORN, RD, LDN, CDCES Registered Dietitian III Certified Diabetes Care and Education Specialist If unable to reach this RD, please use RD Inpatient group chat on secure chat between hours of 8am-4 pm daily

## 2024-05-22 NOTE — Inpatient Diabetes Management (Addendum)
 Inpatient Diabetes Program Recommendations  AACE/ADA: New Consensus Statement on Inpatient Glycemic Control (2015)  Target Ranges:  Prepandial:   less than 140 mg/dL      Peak postprandial:   less than 180 mg/dL (1-2 hours)      Critically ill patients:  140 - 180 mg/dL   Lab Results  Component Value Date   GLUCAP 198 (H) 05/22/2024   HGBA1C 8.2 (H) 05/21/2024   Diabetes history: New Onset DM2 Current orders for Inpatient glycemic control: Lantus  10 units daily Novolog  0-6 units tid, 0-5 units hs  Inpatient Diabetes Program Recommendations:   Spoke with pt about new diagnosis. Discussed A1C results with them and explained what an A1C is, basic pathophysiology of DM Type 2, basic home care, basic diabetes diet nutrition principles, importance of checking CBGs and maintaining good CBG control to prevent long-term and short-term complications. Reviewed signs and symptoms of hyperglycemia and hypoglycemia and how to treat hypoglycemia at home. Also reviewed blood sugar goals at home.  RNs to provide ongoing basic DM education at bedside with this patient. Have ordered educational booklet and insulin  starter kit. Have also placed RD consult for DM diet education for this patient.   Educated patient on insulin  pen use at home. Reviewed contents of insulin  flexpen starter kit. Reviewed all steps of insulin  pen including attachment of needle, 2-unit air shot, dialing up dose, giving injection, removing needle, disposal of sharps, storage of unused insulin , disposal of insulin  etc. Patient able to provide successful return demonstration. Also reviewed troubleshooting with insulin  pen. MD to give patient Rxs for insulin  pens and insulin  pen needles.   MD ordered application of Freestyle CGM at discharge for patient. Education done regarding application and changing CGM sensor (alternate every 15 days on back of arms), 1 hour warm-up, use of glucometer when alert displays, how to scan CGM for glucose  reading and information for PCP. Patient has also been given educational packet regarding use CGM sensor including the 1-800 toll free number for any questions, problems or needs related to the [facility] sensors or reader.    Sensor applied by patient to (R) Arm at 11:15 am.  Explained that glucose readings will not be available until 1 hour after application. Reviewed use of CGM including how to scan, changing Sensor, Vitamin C warning, arrows with glucose readings, and Freestyle app. Patient very appreciative.   Patient interested in GLP1 since she now has dx of diabetes and would be covered with her insurance.  Thank you, Danielle Leas E. Lashawnda Hancox, RN, MSN, CNS, CDCES  Diabetes Coordinator Inpatient Glycemic Control Team Team Pager [phone] (8am-5pm) 05/22/2024 11:46 AM

## 2024-05-22 NOTE — Assessment & Plan Note (Addendum)
 Patient was interested in going home with insulin .  Diabetes coordinator taught her how to inject insulin  and get her set up with [facility] freestyle sensor.  Will give Lantus  insulin  18 units at bedtime.  If sugars still remain in the 200s can go up to 3 units every 3 days.  Patient interested in one of the GLP-1 inhibitors as outpatient.  This will have to be approved with primary care physician.

## 2024-05-22 NOTE — Telephone Encounter (Signed)
 Patient Product/process development scientist completed.    The patient is insured through [employer]. Patient has [employer], may use a copay card, and/or apply for patient assistance if available.    Ran test claim for Semglee  Pen and the current 30 day co-pay is $0.00.  Ran test claim for [employer] and Requires Prior Authorization  This test claim was processed through [employer]- copay amounts may vary at other pharmacies due to [employer], or as the patient moves through the different stages of their insurance plan.     Reyes Sharps, CPHT Pharmacy Technician III Certified Patient Advocate [facility] Patient Advocate Team Direct Number: [phone]  Fax: [phone]

## 2024-05-22 NOTE — Assessment & Plan Note (Signed)
BMI 60.4

## 2024-05-22 NOTE — Telephone Encounter (Signed)
 Pharmacy Patient Advocate Encounter   Received notification from Inpatient Request that prior authorization for  FreeStyle Libre 3 Plus Sensor  is required/requested.   Insurance verification completed.   The patient is insured through [employer] .   Per test claim: PA required; PA submitted to above mentioned insurance via Latent Key/confirmation #/EOC [id number] Status is pending

## 2024-05-22 NOTE — Plan of Care (Signed)

## 2024-05-23 ENCOUNTER — Telehealth: Payer: Self-pay

## 2024-05-23 ENCOUNTER — Other Ambulatory Visit (HOSPITAL_COMMUNITY): Payer: Self-pay

## 2024-05-23 NOTE — Telephone Encounter (Signed)
 Copied from CRM [id number]. Topic: Appointments - Scheduling Inquiry for Clinic >> May 23, 2024 12:54 PM Zy'onna H wrote: Reason for CRM:  Per the patients request for her recently scheduled Hospital Follow Up visit, if the patient can be seen prior to the 24th of Sept 2025, please call telephone to move the appointment up.  As she was informed by the Charlie Patterson, MD & Cort ONEIDA Mana, MD to be seen within 5 days of completing her antibiotic Rx.  If the patient can be seen at an earlier time, that'd be wonderful

## 2024-05-23 NOTE — Telephone Encounter (Signed)
 Spoke to pt to se if she would like to see another provider before the 24th pt declined and stated that she would like to wait and see Maragret

## 2024-05-23 NOTE — Telephone Encounter (Signed)
 Pharmacy Patient Advocate Encounter  Received notification from [employer] that Prior Authorization for FreeStyle Libre 3 Plus Sensor  has been APPROVED from 05/23/2024 to 05/23/2025. Ran test claim, Copay is $0.00. This test claim was processed through [facility]- copay amounts may vary at other pharmacies due to pharmacy/plan contracts, or as the patient moves through the different stages of their insurance plan.   PA #/Case ID/Reference #: [id number]

## 2024-05-23 NOTE — Transitions of Care (Post Inpatient/ED Visit) (Signed)
   05/23/2024  Name: Danielle Browning MRN: [id number] DOB: [birth year]  Today's TOC FU Call Status: Today's TOC FU Call Status:: Unsuccessful Call (1st Attempt) Unsuccessful Call (1st Attempt) Date: 05/23/24  Attempted to reach the patient regarding the most recent Inpatient/ED visit. Left a HIPAA approved voicemail message to phone number provided in demographics on preferred number listed.  Follow Up Plan: Additional outreach attempts will be made to reach the patient to complete the Transitions of Care (Post Inpatient/ED visit) call.   Richerd Fish, RN, BSN, CCM [facility], [facility] RN Care Manager Direct Dial: [phone]

## 2024-05-24 ENCOUNTER — Telehealth: Payer: Self-pay

## 2024-05-24 NOTE — Transitions of Care (Post Inpatient/ED Visit) (Signed)
   05/24/2024  Name: Edan Serratore MRN: [id number] DOB: [birth year]  Today's TOC FU Call Status: Today's TOC FU Call Status:: Unsuccessful Call (2nd Attempt) Unsuccessful Call (2nd Attempt) Date: 05/24/24  Attempted to reach the patient regarding the most recent Inpatient/ED visit.  Follow Up Plan: Additional outreach attempts will be made to reach the patient to complete the Transitions of Care (Post Inpatient/ED visit) call.   Vannie Hilgert J. Billee Balcerzak RN, MSN [facility], [facility] RN Care Manager Direct Dial: [phone]  Fax: [phone] Website: [email or web address]

## 2024-05-24 NOTE — Telephone Encounter (Signed)
 Call pt Appt opened up for tomorrow, please sch

## 2024-05-24 NOTE — Telephone Encounter (Signed)
 Spoke to pt she declined to see another provider other than you!

## 2024-05-24 NOTE — Telephone Encounter (Signed)
 Patient is scheduled and aware.

## 2024-05-25 ENCOUNTER — Ambulatory Visit: Admitting: Family

## 2024-05-25 ENCOUNTER — Telehealth: Payer: Self-pay

## 2024-05-25 ENCOUNTER — Encounter: Payer: Self-pay | Admitting: Family

## 2024-05-25 ENCOUNTER — Encounter: Payer: Self-pay | Admitting: Pulmonary Disease

## 2024-05-25 VITALS — BP 142/78 | HR 78 | Temp 98.5°F | Ht 61.0 in | Wt 319.7 lb

## 2024-05-25 DIAGNOSIS — F411 Generalized anxiety disorder: Secondary | ICD-10-CM | POA: Diagnosis not present

## 2024-05-25 DIAGNOSIS — E1165 Type 2 diabetes mellitus with hyperglycemia: Secondary | ICD-10-CM | POA: Diagnosis not present

## 2024-05-25 DIAGNOSIS — I2699 Other pulmonary embolism without acute cor pulmonale: Secondary | ICD-10-CM

## 2024-05-25 DIAGNOSIS — F32A Depression, unspecified: Secondary | ICD-10-CM | POA: Insufficient documentation

## 2024-05-25 DIAGNOSIS — Z794 Long term (current) use of insulin: Secondary | ICD-10-CM

## 2024-05-25 DIAGNOSIS — L03116 Cellulitis of left lower limb: Secondary | ICD-10-CM

## 2024-05-25 DIAGNOSIS — Z7985 Long-term (current) use of injectable non-insulin antidiabetic drugs: Secondary | ICD-10-CM

## 2024-05-25 DIAGNOSIS — R300 Dysuria: Secondary | ICD-10-CM

## 2024-05-25 LAB — URINALYSIS, ROUTINE W REFLEX MICROSCOPIC
Nitrite: NEGATIVE
Specific Gravity, Urine: 1.02 (ref 1.000–1.030)
Total Protein, Urine: 30 — AB
Urine Glucose: NEGATIVE
Urobilinogen, UA: 0.2 (ref 0.0–1.0)
pH: 6 (ref 5.0–8.0)

## 2024-05-25 MED ORDER — BUSPIRONE HCL 5 MG PO TABS: 5.0000 mg | ORAL_TABLET | Freq: Three times a day (TID) | ORAL | 1 refills | Status: DC | PRN

## 2024-05-25 MED ORDER — TIRZEPATIDE 2.5 MG/0.5ML ~~LOC~~ SOAJ: 2.5000 mg | SUBCUTANEOUS | 2 refills | Status: DC

## 2024-05-25 MED ORDER — SERTRALINE HCL 50 MG PO TABS: 50.0000 mg | ORAL_TABLET | Freq: Every day | ORAL | 3 refills | Status: DC

## 2024-05-25 MED ORDER — APIXABAN 5 MG PO TABS
5.0000 mg | ORAL_TABLET | Freq: Two times a day (BID) | ORAL | 3 refills | Status: AC
Start: 2024-05-25 — End: ?

## 2024-05-25 NOTE — Progress Notes (Signed)
 Assessment & Plan:  Uncontrolled type 2 diabetes mellitus with hyperglycemia, without long-term current use of insulin  (HCC) Assessment & Plan: Uncontrolled, new onset.   Transition to Mounjaro  is planned for better glycemic control, weight loss, and sleep apnea management. Start Mounjaro  2.5 mg once weekly and decrease Lantus  to 10 units at night. Monitor blood glucose with Libre and provide parameters for glucose monitoring, advising to call if levels are above 250 or below 70. Educate on Mounjaro  side effects, including nausea and diarrhea.    Orders: -     Tirzepatide ; Inject 2.5 mg into the skin once a week.  Dispense: 2 mL; Refill: 2  Other acute pulmonary embolism without acute cor pulmonale (HCC) Assessment & Plan: Pulmonary embolism requires lifelong anticoagulation. A pulmonology referral is pending, while a hematology referral is deferred.  Consider in the future for formal anticoagulation testing.  Place a referral to pulmonology  due to concern for desaturations at night, sleep apnea, pulmonary embolism  Orders: -     Pulmonary Visit -     Apixaban ; Take 1 tablet (5 mg total) by mouth 2 (two) times daily.  Dispense: 180 tablet; Refill: 3  GAD (generalized anxiety disorder) Assessment & Plan: Chronic, suboptimal control.  No longer on duloxetine .  Started Zoloft  25 mg.  Encouraged her to increase to 50 mg. Discontinue Wellbutrin  with a tapering plan. Prescribe Buspar  5 mg three times a day as needed for anxiety. Close follow-up   Orders: -     Sertraline  HCl; Take 1 tablet (50 mg total) by mouth at bedtime.  Dispense: 90 tablet; Refill: 3 -     busPIRone  HCl; Take 1 tablet (5 mg total) by mouth 3 (three) times daily as needed.  Dispense: 60 tablet; Refill: 1  Dysuria -     Urine Culture -     Urinalysis, Routine w reflex microscopic  Cellulitis of left lower extremity Assessment & Plan: Hospitalization course reviewed.  Medications reconciled. Cellulitis presents  with pain and sensitivity, though swelling has decreased.  Advised to complete  Zyvox . Avoid Ace Wraps until pain is tolerable. Elevate legs and manage pain with hydrocodone -acetaminophen  as needed.  Complete short-term disability paperwork.      Return precautions given.   Risks, benefits, and alternatives of the medications and treatment plan prescribed today were discussed, and patient expressed understanding.   Education regarding symptom management and diagnosis given to patient on AVS either electronically or printed.  Return in about 2 weeks (around 06/08/2024).  Rollene Northern, FNP  Subjective:    Patient ID: Danielle Browning, female    DOB: 07-Dec-1975, 48 y.o.   MRN: [id number]  CC: Danielle Browning is a 48 y.o. female who presents today for follow up.   HPI: HPI Discussed the use of AI scribe software for clinical note transcription with the patient, who gave verbal consent to proceed.  History of Present Illness   Danielle Browning is a 48 year old female after hospitalization for cellulitis , PE and new onset diabetes.  She experiences persistent pain and sensitivity in her legs, particularly the left leg, improved from prior, following a recent hospitalization for cellulitis. The swelling has decreased, but the area remains extremely sensitive, causing pain even with light touch. She is currently on oral  Zyvox . Denies fever or chills. Swelling in her legs increases when they are not elevated, and she is not considering returning to work due to her condition at this time.   She has a remote history of  sleep apnea, though she has never undergone a sleep study or received treatment. She noted oxygen to drop into the 80s during her hospital stay.  She has a history of pulmonary embolism and is on Eliquis . She is also on Lantus  for diabetes management, taking 20 units daily, though her blood sugar levels are erratic, never dropping below 150 mg/dL.   She is currently taking  hydrocodone  acetaminophen  for pain management. She is also on Zoloft  25mg , and no longer on duloxetine . She desires to taper off Wellbutrin .   She is concerned about a potential urinary tract infection, noting pain at the end of urination. She is experiencing cold feet and restless legs, which she attributes to potential iron deficiency.          Compliant apixaban  5 mg twice daily.  Compliant with bupropion  150 mg daily.  No longer on Cymbalta .  Compliant with 25 mg Zoloft .  Admitted 05/15/2024 and discharged 05/22/2024.  Principal admitting diagnoses cellulitis, clinical sepsis, acute pulmonary embolism, sleep apnea, depression, diabetes Hospital course significant for small right lower lobe pulmonary embolism.  CT angiogram 05/15/2024  very small volume nonocclusive thrombus in the right lung lower lobe.No associated right heart strain or lung infarction.  started on IV antibiotics and Eliquis .  She has a prior history of an embolism after right ankle fracture. Vancomycin  changed to clindamycin  05/20/2024.  New onset diabetes hemoglobin 8.2.  MRI generalized cutaneous edema and possible mild enhancement of both lower legs.  No deep seeded fluid collection.  No osteomyelitis.  Infectious disease consult and switch to Zyvox   left lower extremity ultrasound negative DVT Given CGM.  Lantus  20 units daily Consult with psychiatry and Cymbalta  changed to Zoloft . Discharged on 5 days outpatient Zyvox    Allergies: Penicillins, Other, Fruit & vegetable daily [nutritional supplements], Sulfa antibiotics, and Rocephin  [ceftriaxone ] Current Outpatient Medications on File Prior to Visit  Medication Sig Dispense Refill   albuterol  (VENTOLIN  HFA) 108 (90 Base) MCG/ACT inhaler Inhale 2 puffs into the lungs every 6 (six) hours as needed for wheezing or shortness of breath. 6.7 g 0   Cholecalciferol  (VITAMIN D3 PO) Take 4,000 Int'l Units by mouth.     clobetasol  ointment (TEMOVATE ) 0.05 % Apply to affected skin  qd-bid prn, Avoid applying to face, groin, and axilla. Use as directed. Long-term use can cause thinning of the skin. 30 g 2   Clotrimazole  1 % OINT Apply 1 application topically 2 (two) times daily. 56.7 g 1   Dupilumab  (DUPIXENT ) 300 MG/2ML SOAJ INJECT 300 MG UNDER THE SKIN EVERY 14 DAYS 4 mL 11   gabapentin  (NEURONTIN ) 300 MG capsule TAKE 1 CAPSULE BY MOUTH THREE TIMES A DAY 90 capsule 1   hydrOXYzine  (ATARAX ) 25 MG tablet Take 25 mg by mouth 3 (three) times daily.     insulin  glargine-yfgn (SEMGLEE ) 100 UNIT/ML Pen Inject 18 Units into the skin at bedtime. 15 mL 0   Insulin  Pen Needle (EMBECTA PEN NEEDLE NANO) 32G X 4 MM MISC 1 Dose by Does not apply route daily. 30 each 0   levocetirizine (XYZAL) 5 MG tablet Take 5 mg by mouth every evening.     meloxicam  (MOBIC ) 15 MG tablet TAKE 1 TABLET (15 MG TOTAL) BY MOUTH DAILY AS NEEDED FOR PAIN WITH FOOD 30 tablet 1   mometasone  (ELOCON ) 0.1 % cream Apply 1 application. topically daily as needed (Rash). Qd up to 5 days a week to eczema on hands and left foot prn flares 45 g 3  mupirocin  ointment (BACTROBAN ) 2 % Apply 1 application topically 2 (two) times daily. To lesions on abdomen. 22 g 0   omeprazole  (PRILOSEC) 20 MG capsule TAKE 1 CAPSULE (20 MG TOTAL) BY MOUTH DAILY. TAKE 30 MINUTES TO ONE HOUR BEFORE BREAKFAST 90 capsule 0   polyethylene glycol (MIRALAX  / GLYCOLAX ) 17 g packet Take 17 g by mouth daily. 30 each 0   Ruxolitinib Phosphate  (OPZELURA ) 1.5 % CREA Apply to hands once a day 60 g 2   saccharomyces boulardii (FLORASTOR) 250 MG capsule Take 1 capsule (250 mg total) by mouth 2 (two) times daily. 60 capsule 0   triamcinolone  (KENALOG ) 0.025 % cream Apply 1 application topically 2 (two) times daily. 80 g 2   [DISCONTINUED] fluticasone  (FLONASE ) 50 MCG/ACT nasal spray Place 2 sprays into both nostrils daily. 16 g 0   No current facility-administered medications on file prior to visit.    Review of Systems  Constitutional:  Negative for  chills and fever.  Respiratory:  Positive for shortness of breath. Negative for cough.   Cardiovascular:  Positive for leg swelling. Negative for chest pain and palpitations.  Gastrointestinal:  Negative for nausea and vomiting.  Psychiatric/Behavioral:  Negative for suicidal ideas.       Objective:    BP (!) 142/78   Pulse 78   Temp 98.5 F (36.9 C) (Oral)   Ht 5' 1 (1.549 m)   Wt (!) 319 lb 10.7 oz (145 kg)   LMP  (LMP Unknown)   SpO2 95%   BMI 60.40 kg/m  BP Readings from Last 3 Encounters:  05/25/24 (!) 142/78  05/22/24 124/72  05/15/24 130/78   Wt Readings from Last 3 Encounters:  05/25/24 (!) 319 lb 10.7 oz (145 kg)  05/15/24 (!) 319 lb 10.7 oz (145 kg)  05/15/24 (!) 320 lb 3.2 oz (145.2 kg)      05/25/2024   11:14 AM 05/15/2024    8:36 AM 07/28/2023    9:21 AM  Depression screen PHQ 2/9  Decreased Interest 0 0 0  Down, Depressed, Hopeless 0 0 0  PHQ - 2 Score 0 0 0  Altered sleeping 0  0  Tired, decreased energy 0  0  Change in appetite 0  0  Feeling bad or failure about yourself  0  0  Trouble concentrating 0  0  Moving slowly or fidgety/restless 0  0  Suicidal thoughts 0  0  PHQ-9 Score 0  0  Difficult doing work/chores Not difficult at all        05/25/2024   11:14 AM 07/28/2023    9:22 AM  GAD 7 : Generalized Anxiety Score  Nervous, Anxious, on Edge 0 0  Control/stop worrying 0 0  Worry too much - different things 0 0  Trouble relaxing 0 0  Restless 0 0  Easily annoyed or irritable 0 0  Afraid - awful might happen 0 0  Total GAD 7 Score 0 0  Anxiety Difficulty Not difficult at all Not difficult at all     Physical Exam Vitals reviewed.  Constitutional:      Appearance: She is well-developed.  Eyes:     Conjunctiva/sclera: Conjunctivae normal.  Cardiovascular:     Rate and Rhythm: Normal rate and regular rhythm.     Pulses: Normal pulses.     Heart sounds: Normal heart sounds.     Comments: 4 x 7 diffuse  pretibia erythematous area.  I  did not palpate due to the patient's  concern for pain.  No weeping, purulent discharge. Pulmonary:     Effort: Pulmonary effort is normal.     Breath sounds: Normal breath sounds. No wheezing, rhonchi or rales.  Musculoskeletal:     Right lower leg: 1+ Pitting Edema present.     Left lower leg: 1+ Pitting Edema present.  Skin:    General: Skin is warm and dry.  Neurological:     Mental Status: She is alert.  Psychiatric:        Speech: Speech normal.        Behavior: Behavior normal.        Thought Content: Thought content normal.

## 2024-05-25 NOTE — Assessment & Plan Note (Addendum)
 Uncontrolled, new onset.   Transition to Mounjaro  is planned for better glycemic control, weight loss, and sleep apnea management. Start Mounjaro  2.5 mg once weekly and decrease Lantus  to 10 units at night. Monitor blood glucose with Libre and provide parameters for glucose monitoring, advising to call if levels are above 250 or below 70. Educate on Mounjaro  side effects, including nausea and diarrhea.

## 2024-05-25 NOTE — Telephone Encounter (Signed)
 Danielle Browning is it ok to send another letter via my chart for her to be out until 06/08/24

## 2024-05-25 NOTE — Transitions of Care (Post Inpatient/ED Visit) (Signed)
   05/25/2024  Name: Danielle Browning MRN: [id number] DOB: 07/16/1976  Today's TOC FU Call Status: Today's TOC FU Call Status:: Unsuccessful Call (3rd Attempt) Unsuccessful Call (3rd Attempt) Date: 05/25/24  Attempted to reach the patient regarding the most recent Inpatient/ED visit.  Follow Up Plan: No further outreach attempts will be made at this time. We have been unable to contact the patient.  [name] J. Daelyn Pettaway RN, MSN [facility], [facility] RN Care Manager Direct Dial: [phone]  Fax: [phone] Website: [email or web address]

## 2024-05-25 NOTE — Patient Instructions (Addendum)
 Start mounjaro  2.5mg   Decrease lantus  to 10 units   After one week on wellbutrin  150mg  daily, then take every other day for 3 days.   Referral to pulmonology; consider hematology consult  Let us  know if you dont hear back within 2 weeks in regards to an appointment being scheduled.   So that you are aware, if you are [facility] MyChart user , please pay attention to your MyChart messages as you may receive a MyChart message with a phone number to call and schedule this test/appointment own your own from our referral coordinator. This is a new process so I do not want you to miss this message.  If you are not a MyChart user, you will receive a phone call.    Goal of fasting blood sugar is between 70-120. If in this range, we are reaching our target a1c ( goal 6.5%)   Please check fasting blood sugar in the morning time once or twice a week.  You may also check if you feel like you are having a low episode or particularly high episode of blood sugar.  If blood sugars increase, I may advise you to check blood sugar after your largest meal.  You specifically do this TWO hours after largest meal with the goal of being less than 180.  If blood sugar is checked sooner than 2 hours after largest meal, and it will be  expected to be elevated. You must wait 2 hours.   If your blood sugar is less than 180 hours after your largest meal, again we are reaching our target a1c goal   Call [facility] clinic if: BG < 70 or > 300.   If you have any symptoms of low blood sugar ( sweating, shakiness, lightheaded, dizzy) that you notify me. If you have a low, please drink a glass of orange juice and recheck blood sugar every 5 minutes until you don't feel symptomatic AND blood sugar is above 80.   If Mounjaro  is not covered through your insurance, you may go to the [employer] website at mounjaro .com to complete information for savings card.   You may also use the link below.    [email or web address] [email or web address]#savings  You may take this savings to [facility], [facility] or [facility]. If you are unable to get mounjaro , please call to schedule an appointment with me so we can discuss alternative weight loss medications.   start Mounjaro  2.5mg  once per week injected subcutaneously ( [place])  in stomach. Please clean with alcohol swab prior to injection and be sure to rotate site. You may schedule a nurse visit if you would like to first injection.   After 4 weeks, and if tolerated and weight loss has not reached 1-2 lbs per week, please increase to 5mg  once per week [place].    Please read information on medication below and remember black box warning that you may not take if you or a family member is diagnosed with thyroid  cancer (medullary thyroid  cancer), or multiple endocrine neoplasia.       Tirzepatide  Injection ( Mounjaro ) What is this medication? TIRZEPATIDE  (tir ZEP a tide) treats type 2 diabetes. It works by increasing insulin  levels in your body, which decreases your blood sugar (glucose). Changes to diet and exercise are often combined with this medication. This medicine may be used for other purposes; ask your health care provider or pharmacist if you have questions. COMMON BRAND NAME(S): MOUNJARO  What should I tell my care team before I take this medication? They need  to know if you have any of these conditions: Endocrine tumors (MEN 2) or if someone in your family had these tumors Eye disease, vision problems Gallbladder disease History of pancreatitis Kidney disease Stomach or intestine problems Thyroid  cancer or if someone in your family had thyroid  cancer An unusual or allergic reaction to tirzepatide , other medications, foods, dyes, or preservatives Pregnant or trying to get pregnant Breast-feeding How should I use this medication? This medication is injected under the skin. You will be taught how to prepare and give it. It is given once every  week (every 7 days). Keep taking it unless your health care provider tells you to stop. If you use this medication with insulin , you should inject this medication and the insulin  separately. Do not mix them together. Do not give the injections right next to each other. Change (rotate) injection sites with each injection. This medication comes with INSTRUCTIONS FOR USE. Ask your pharmacist for directions on how to use this medication. Read the information carefully. Talk to your pharmacist or care team if you have questions. It is important that you put your used needles and syringes in a special sharps container. Do not put them in a trash can. If you do not have a sharps container, call your pharmacist or care team to get one. A special MedGuide will be given to you by the pharmacist with each prescription and refill. Be sure to read this information carefully each time. Talk to your care team about the use of this medication in children. Special care may be needed. Overdosage: If you think you have taken too much of this medicine contact a poison control center or emergency room at once. NOTE: This medicine is only for you. Do not share this medicine with others. What if I miss a dose? If you miss a dose, take it as soon as you can unless it is more than 4 days (96 hours) late. If it is more than 4 days late, skip the missed dose. Take the next dose at the normal time. Do not take 2 doses within 3 days of each other. What may interact with this medication? Alcohol containing beverages Antiviral medications for HIV or AIDS Aspirin and aspirin-like medications Beta-blockers like atenolol, metoprolol, propranolol Certain medications for blood pressure, heart disease, irregular heart beat Chromium Clonidine Diuretics Female hormones, such as estrogens or progestins, birth control pills Fenofibrate Gemfibrozil Guanethidine Isoniazid Lanreotide Female hormones or anabolic steroids MAOIs like  Carbex, Eldepryl, Marplan, Nardil, and Parnate Medications for weight loss Medications for allergies, asthma, cold, or cough Medications for depression, anxiety, or psychotic disturbances Niacin Nicotine NSAIDs, medications for pain and inflammation, like ibuprofen  or naproxen Octreotide Other medications for diabetes, like glyburide, glipizide, or glimepiride Pasireotide Pentamidine Phenytoin Probenecid Quinolone antibiotics such as ciprofloxacin , levofloxacin , ofloxacin Reserpine Some herbal dietary supplements Steroid medications such as prednisone  or cortisone Sulfamethoxazole; trimethoprim Thyroid  hormones Warfarin This list may not describe all possible interactions. Give your health care provider a list of all the medicines, herbs, non-prescription drugs, or dietary supplements you use. Also tell them if you smoke, drink alcohol, or use illegal drugs. Some items may interact with your medicine. What should I watch for while using this medication? Visit your care team for regular checks on your progress. Drink plenty of fluids while taking this medication. Check with your care team if you get an attack of severe diarrhea, nausea, and vomiting. The loss of too much body fluid can make it dangerous for you to  take this medication. A test called the HbA1C (A1C) will be monitored. This is a simple blood test. It measures your blood sugar control over the last 2 to 3 months. You will receive this test every 3 to 6 months. Learn how to check your blood sugar. Learn the symptoms of low and high blood sugar and how to manage them. Always carry a quick-source of sugar with you in case you have symptoms of low blood sugar. Examples include hard sugar candy or glucose tablets. Make sure others know that you can choke if you eat or drink when you develop serious symptoms of low blood sugar, such as seizures or unconsciousness. They must get medical help at once. Tell your care team if you have  high blood sugar. You might need to change the dose of your medication. If you are sick or exercising more than usual, you might need to change the dose of your medication. Do not skip meals. Ask your care team if you should avoid alcohol. Many nonprescription cough and cold products contain sugar or alcohol. These can affect blood sugar. Pens should never be shared. Even if the needle is changed, sharing may result in passing of viruses like hepatitis or HIV. Wear a medical ID bracelet or chain, and carry a card that describes your disease and details of your medication and dosage times. Birth control may not work properly while you are taking this medication. If you take birth control pills by mouth, your care team may recommend another type of birth control for 4 weeks after you start this medication and for 4 weeks after each increase in your dose of this medication. Ask your care team which birth control methods you should use. What side effects may I notice from receiving this medication? Side effects that you should report to your care team as soon as possible: Allergic reactions-skin rash, itching, hives, swelling of the face, lips, tongue, or throat Change in vision Dehydration-increased thirst, dry mouth, feeling faint or lightheaded, headache, dark yellow or brown urine Gallbladder problems-severe stomach pain, nausea, vomiting, fever Kidney injury-decrease in the amount of urine, swelling of the ankles, hands, or feet Pancreatitis-severe stomach pain that spreads to your back or gets worse after eating or when touched, fever, nausea, vomiting Thyroid  cancer-new mass or lump in the neck, pain or trouble swallowing, trouble breathing, hoarseness Side effects that usually do not require medical attention (report these to your care team if they continue or are bothersome): Constipation Diarrhea Loss of Appetite Nausea Stomach pain Upset stomach Vomiting This list may not describe all  possible side effects. Call your doctor for medical advice about side effects. You may report side effects to FDA at 1-800-FDA-1088. Where should I keep my medication? Keep out of the reach of children and pets. Refrigeration (preferred): Store unopened pens in a refrigerator between 2 and 8 degrees C (36 and 46 degrees F). Keep it in the original carton until you are ready to take it. Do not freeze or use if the medication has been frozen. Protect from light. Get rid of any unused medication after the expiration date on the label. Room Temperature: The pen may be stored at room temperature below 30 degrees C (86 degrees F) for up to a total of 21 days if needed. Protect from light. Avoid exposure to extreme heat. If it is stored at room temperature, throw away any unused medication after 21 days or after it expires, whichever is first. The pen has glass  parts. Handle it carefully. If you drop the pen on a hard surface, do not use it. Use a new pen for your injection. To get rid of medications that are no longer needed or have expired: Take the medication to a medication take-back program. Check with your pharmacy or law enforcement to find a location. If you cannot return the medication, ask your pharmacist or care team how to get rid of this medication safely. NOTE: This sheet is a summary. It may not cover all possible information. If you have questions about this medicine, talk to your doctor, pharmacist, or health care provider.  2022 Elsevier/Gold Standard (2021-01-20 13:57:48)

## 2024-05-26 ENCOUNTER — Ambulatory Visit: Payer: Self-pay | Admitting: Family

## 2024-05-26 DIAGNOSIS — R899 Unspecified abnormal finding in specimens from other organs, systems and tissues: Secondary | ICD-10-CM

## 2024-05-26 LAB — URINE CULTURE
MICRO NUMBER:: 16992067
SPECIMEN QUALITY:: ADEQUATE

## 2024-05-28 ENCOUNTER — Encounter: Payer: Self-pay | Admitting: Family

## 2024-05-28 NOTE — Telephone Encounter (Signed)
 Pt asked for work note until 06/08/24 so we can get paperwork [facility]) completed before then so she can be out until further notice

## 2024-05-28 NOTE — Telephone Encounter (Signed)
 Spoke to pt informed her that they will reach out to her to schedule also her Appt is scheduled for Nov for Pulmonary

## 2024-05-29 NOTE — Assessment & Plan Note (Signed)
 Chronic, suboptimal control.  No longer on duloxetine .  Started Zoloft  25 mg.  Encouraged her to increase to 50 mg. Discontinue Wellbutrin  with a tapering plan. Prescribe Buspar  5 mg three times a day as needed for anxiety. Close follow-up

## 2024-05-29 NOTE — Assessment & Plan Note (Addendum)
 Pulmonary embolism requires lifelong anticoagulation. A pulmonology referral is pending, while a hematology referral is deferred.  Consider in the future for formal anticoagulation testing.  Place a referral to pulmonology  due to concern for desaturations at night, sleep apnea, pulmonary embolism

## 2024-05-29 NOTE — Assessment & Plan Note (Signed)
 Hospitalization course reviewed.  Medications reconciled. Cellulitis presents with pain and sensitivity, though swelling has decreased.  Advised to complete  Zyvox . Avoid Ace Wraps until pain is tolerable. Elevate legs and manage pain with hydrocodone -acetaminophen  as needed.  Complete short-term disability paperwork.

## 2024-05-30 ENCOUNTER — Telehealth: Payer: Self-pay

## 2024-05-30 ENCOUNTER — Inpatient Hospital Stay: Admitting: Family

## 2024-05-31 ENCOUNTER — Telehealth: Payer: Self-pay

## 2024-05-31 NOTE — Telephone Encounter (Signed)
 Pharmacy Patient Advocate Encounter   Received notification from Onbase that prior authorization for Mounjaro  2.5MG /0.5ML auto-injectors is required/requested.   Insurance verification completed.   The patient is insured through [employer] .   Per test claim: PA required; PA submitted to above mentioned insurance via Latent Key/confirmation #/EOC [id number] Status is pending

## 2024-06-01 ENCOUNTER — Telehealth: Payer: Self-pay

## 2024-06-01 NOTE — Telephone Encounter (Signed)
 Copied from CRM #[id number]. Topic: Appointments - Scheduling Inquiry for Clinic >> Jun 01, 2024 11:34 AM Harlene ORN wrote: Reason for CRM: Danielle Browning - [employer]  REP from [facility] called. Patient as referred to their case management program. Wants to know if the patient has been seen recently for post-hospital follow up.  Phone: [phone] ext [phone]

## 2024-06-04 ENCOUNTER — Telehealth: Payer: Self-pay

## 2024-06-04 DIAGNOSIS — B379 Candidiasis, unspecified: Secondary | ICD-10-CM

## 2024-06-04 NOTE — Telephone Encounter (Signed)
 Copied from CRM #[id number]. Topic: Clinical - Prescription Issue >> Jun 04, 2024  4:44 PM Alfonso HERO wrote: Reason for CRM: patient has been on antibiotics and has a bad yeast infection and requesting for Diflucan to be sent to the pharmacy.

## 2024-06-05 ENCOUNTER — Telehealth: Payer: Self-pay | Admitting: Family

## 2024-06-05 ENCOUNTER — Other Ambulatory Visit (HOSPITAL_COMMUNITY): Payer: Self-pay

## 2024-06-05 NOTE — Telephone Encounter (Signed)
 LVM and sent via my chart as well. Please inform and document when pt calls back

## 2024-06-05 NOTE — Telephone Encounter (Signed)
 Pharmacy Patient Advocate Encounter  Received notification from [employer] that Prior Authorization for Mounjaro  2.5MG /0.5ML auto-injectors  has been APPROVED from 05/31/24 to 06/05/25. Ran test claim, Copay is $0. This test claim was processed through [facility]- copay amounts may vary at other pharmacies due to pharmacy/plan contracts, or as the patient moves through the different stages of their insurance plan.   PA #/Case ID/Reference #: [id number]

## 2024-06-05 NOTE — Telephone Encounter (Signed)
 Copied from CRM [id number]. Topic: General - Other >> Jun 04, 2024  4:42 PM Alfonso HERO wrote: Reason for CRM: Patient called to see if the paperwork she was supposed to pick up can be uploaded into her mychart and she will come by at a later date to get the hard copies.

## 2024-06-05 NOTE — Telephone Encounter (Signed)
 LCVM for [facility] @ [phone] ext [phone] to inform her that pt was seen in our office for hsp f/up if she has any questions or needs us  to fax ov notes please feel free to call [phone], left my name also as contact

## 2024-06-06 MED ORDER — FLUCONAZOLE 150 MG PO TABS: 150.0000 mg | ORAL_TABLET | Freq: Once | ORAL | 0 refills | Status: AC

## 2024-06-06 NOTE — Telephone Encounter (Signed)
 Pt is aware.

## 2024-06-06 NOTE — Addendum Note (Signed)
 Addended by: DINEEN ROLLENE MATSU on: 06/06/2024 09:41 AM   Modules accepted: Orders

## 2024-06-06 NOTE — Telephone Encounter (Signed)
 Call pt Diflucan sent in We rec;d treatment and self management plan request to return to case manager; print med list and ask if she would us  to fax for her

## 2024-06-06 NOTE — Telephone Encounter (Signed)
 LVM notifying pt we are unable to upload her FMLA paperwork to her MyChart she will have to pick it up her next appt 06/07/2024 11:30AM

## 2024-06-07 ENCOUNTER — Encounter: Payer: Self-pay | Admitting: Family

## 2024-06-07 ENCOUNTER — Ambulatory Visit: Admitting: Family

## 2024-06-07 VITALS — BP 136/72 | HR 83 | Temp 98.0°F | Ht 61.0 in | Wt 316.0 lb

## 2024-06-07 DIAGNOSIS — Z7985 Long-term (current) use of injectable non-insulin antidiabetic drugs: Secondary | ICD-10-CM

## 2024-06-07 DIAGNOSIS — E1165 Type 2 diabetes mellitus with hyperglycemia: Secondary | ICD-10-CM

## 2024-06-07 DIAGNOSIS — B372 Candidiasis of skin and nail: Secondary | ICD-10-CM | POA: Diagnosis not present

## 2024-06-07 DIAGNOSIS — Z8619 Personal history of other infectious and parasitic diseases: Secondary | ICD-10-CM

## 2024-06-07 DIAGNOSIS — F32A Depression, unspecified: Secondary | ICD-10-CM

## 2024-06-07 DIAGNOSIS — F411 Generalized anxiety disorder: Secondary | ICD-10-CM | POA: Diagnosis not present

## 2024-06-07 DIAGNOSIS — F419 Anxiety disorder, unspecified: Secondary | ICD-10-CM

## 2024-06-07 MED ORDER — VALACYCLOVIR HCL 500 MG PO TABS
ORAL_TABLET | ORAL | 2 refills | Status: AC
Start: 2024-06-07 — End: ?

## 2024-06-07 MED ORDER — CLOTRIMAZOLE 1 % EX CREA: 1.0000 | TOPICAL_CREAM | Freq: Two times a day (BID) | CUTANEOUS | 2 refills | Status: AC

## 2024-06-07 MED ORDER — FLUCONAZOLE 150 MG PO TABS: 150.0000 mg | ORAL_TABLET | Freq: Once | ORAL | 1 refills | Status: AC

## 2024-06-07 MED ORDER — TRAZODONE HCL 50 MG PO TABS: 25.0000 mg | ORAL_TABLET | Freq: Every evening | ORAL | 3 refills | Status: DC | PRN

## 2024-06-07 MED ORDER — TRIAMCINOLONE ACETONIDE 0.5 % EX OINT
1.0000 | TOPICAL_OINTMENT | Freq: Two times a day (BID) | CUTANEOUS | 2 refills | Status: AC
Start: 2024-06-07 — End: ?

## 2024-06-07 NOTE — Telephone Encounter (Signed)
 Spoke to pt explained to her that we can not send documents via my chart unless we did it in Epic

## 2024-06-07 NOTE — Assessment & Plan Note (Signed)
 H/o genital and oral herpes. No evidence of herpetic infection today. I did however refill valtrex  and counseled on how on to use for recurrence.

## 2024-06-07 NOTE — Patient Instructions (Addendum)
 Start mounjaro  2.5mg  Continue glargine 18units for now.  Goal of fasting blood sugar is between 70-120. If in this range, we are reaching our target a1c ( goal 6.5%)   Please check fasting blood sugar in the morning time once or twice a week.  You may also check if you feel like you are having a low episode or particularly high episode of blood sugar.  If blood sugars increase, I may advise you to check blood sugar after your largest meal.  You specifically do this TWO hours after largest meal with the goal of being less than 180.  If blood sugar is checked sooner than 2 hours after largest meal, and it will be  expected to be elevated. You must wait 2 hours.   If your blood sugar is less than 180 hours after your largest meal, again we are reaching our target a1c goal   Call [facility] clinic if: BG < 70 or > 300.   If you have any symptoms of low blood sugar ( sweating, shakiness, lightheaded, dizzy) that you notify me. If you have a low, please drink a glass of orange juice and recheck blood sugar every 5 minutes until you don't feel symptomatic AND blood sugar is above 80.    Start trazodone 25-50mg  at bedtime for sleep  Start diflucan for yeast infection; take one dose today and then again in  days.   You may also use topical kenalog  0.5% which is a potent steroid ; you may only use for 7 days or LESS   You may combine kenalog  with clotrimazole  cream ( anti fungal) for topical treatment of yeast.   Do not use Clobetasol  or other topical steroids while using kenalog  0.5% as the same drug class.

## 2024-06-07 NOTE — Progress Notes (Signed)
 Assessment & Plan:  Candidal intertrigo Assessment & Plan: Extensive candida infection. Start diflucan 150mg  today and repeat in 3 days time. Provided topical kenalog  0.5% and clotrimazole  to use in the beginning as well. Counseled on limiting use of kenalog  0.5% to < 7 days and not to combine with other topical , or older, prescriptions of topical corticosteroids.   Orders: -     Fluconazole; Take 1 tablet (150 mg total) by mouth once for 1 dose. Repeat in 72 hrs.  Dispense: 2 tablet; Refill: 1 -     Triamcinolone  Acetonide; Apply 1 Application topically 2 (two) times daily. Use sparingly for < 1 week. Do not use internal genitalia  Dispense: 15 g; Refill: 2 -     Clotrimazole ; Apply 1 Application topically 2 (two) times daily.  Dispense: 30 g; Refill: 2  anxiety and depression Assessment & Plan: Chronic, uncontrolled. Discussed altered sleep -wake cycle and how to slowly decrease nap time during the day with use of an alarm clock. Advised not to nap more than 30 min/day. Counseled on sleep hygiene and avoidance of blue light. Continue zoloft  50mg . Stop buspar  ( ineffective). Trial of trazodone 25-50mg  at bedtime.    Orders: -     traZODone HCl; Take 0.5-1 tablets (25-50 mg total) by mouth at bedtime as needed for sleep.  Dispense: 30 tablet; Refill: 3  History of herpes genitalis Assessment & Plan: H/o genital and oral herpes. No evidence of herpetic infection today. I did however refill valtrex  and counseled on how on to use for recurrence.   Orders: -     valACYclovir  HCl; Take 500 mg PO x q 12h x 3 days. ASAP w/in 24h of sx onset.  Dispense: 30 tablet; Refill: 2  Uncontrolled type 2 diabetes mellitus with hyperglycemia, without long-term current use of insulin  (HCC) Assessment & Plan: Uncontrolled. We opted not to reduce lantus  from 18 units to 10 units due to concern for hyperglycemia. She will start mounjaro  2.5mg  and follow up in 2 weeks time to review blood sugar.    Anxiety  and depression Assessment & Plan: Chronic, uncontrolled. Discussed altered sleep -wake cycle and how to slowly decrease nap time during the day with use of an alarm clock. Advised not to nap more than 30 min/day. Counseled on sleep hygiene and avoidance of blue light. Continue zoloft  50mg . Stop buspar  ( ineffective). Trial of trazodone 25-50mg  at bedtime.        Return precautions given.   Risks, benefits, and alternatives of the medications and treatment plan prescribed today were discussed, and patient expressed understanding.   Education regarding symptom management and diagnosis given to patient on AVS either electronically or printed.  Return in about 2 weeks (around 06/21/2024).  Rollene Northern, FNP  Subjective:    Patient ID: Clancy Hummer, female    DOB: 1975-12-23, 48 y.o.   MRN: [id number]  CC: Avilyn Virtue is a 48 y.o. female who presents today for follow up.   HPI: HPI Discussed the use of AI scribe software for clinical note transcription with the patient, who gave verbal consent to proceed.  History of Present Illness   Merritt Kibby is a 48 year old female who presents with a rash and sleep disturbances.  She has a painful and raw rash on her side and under her breast. The pain is significant.   She has diarrhea following the use of Zyvox , which has resolved. She saw blood when wiping due to perineal irritation. Denies chills,  fever or abdominal pain. She has been using perineal wipes.  She experiences significant sleep disturbances, with difficulty sleeping at night and excessive daytime sleepiness. She tosses and turns at night due to rash and trouble getting comfortable.  She sleeps during the day and struggles to sleep at night. Compliant with zoloft  50mg  at night. She has tried Buspar  5mg  without improvement of anxiety.  She is no longer on Wellbutrin .  She uses Atarax  25mg  for itching and does not find this sedating. She takes levocetirizine for allergies.  She consumes yogurt for probiotics after her antibiotic course.  Her blood sugars have been high, and she has inconsistent eating habits, sometimes influenced by her husband bringing her milkshakes. She is on 18 units of Lantus  at bedtime and uses a Libre sensor to monitor glucose levels. Average blood glucose 250. No hypoglycemic episodes. GMI not available due to lack of data.      She hasn't picked up mounjaro  2.5mg   Appointment with Dr Isaiah 07/13/24  Allergies: Penicillins, Other, Fruit & vegetable daily [nutritional supplements], Sulfa antibiotics, and Rocephin  [ceftriaxone ] Current Outpatient Medications on File Prior to Visit  Medication Sig Dispense Refill   albuterol  (VENTOLIN  HFA) 108 (90 Base) MCG/ACT inhaler Inhale 2 puffs into the lungs every 6 (six) hours as needed for wheezing or shortness of breath. 6.7 g 0   apixaban  (ELIQUIS ) 5 MG TABS tablet Take 1 tablet (5 mg total) by mouth 2 (two) times daily. 180 tablet 3   Cholecalciferol  (VITAMIN D3 PO) Take 4,000 Int'l Units by mouth.     clobetasol  ointment (TEMOVATE ) 0.05 % Apply to affected skin qd-bid prn, Avoid applying to face, groin, and axilla. Use as directed. Long-term use can cause thinning of the skin. 30 g 2   Clotrimazole  1 % OINT Apply 1 application topically 2 (two) times daily. 56.7 g 1   Dupilumab  (DUPIXENT ) 300 MG/2ML SOAJ INJECT 300 MG UNDER THE SKIN EVERY 14 DAYS 4 mL 11   gabapentin  (NEURONTIN ) 300 MG capsule TAKE 1 CAPSULE BY MOUTH THREE TIMES A DAY 90 capsule 1   hydrOXYzine  (ATARAX ) 25 MG tablet Take 25 mg by mouth 3 (three) times daily.     insulin  glargine-yfgn (SEMGLEE ) 100 UNIT/ML Pen Inject 18 Units into the skin at bedtime. 15 mL 0   Insulin  Pen Needle (EMBECTA PEN NEEDLE NANO) 32G X 4 MM MISC 1 Dose by Does not apply route daily. 30 each 0   levocetirizine (XYZAL) 5 MG tablet Take 5 mg by mouth every evening.     meloxicam  (MOBIC ) 15 MG tablet TAKE 1 TABLET (15 MG TOTAL) BY MOUTH DAILY AS NEEDED FOR  PAIN WITH FOOD 30 tablet 1   mometasone  (ELOCON ) 0.1 % cream Apply 1 application. topically daily as needed (Rash). Qd up to 5 days a week to eczema on hands and left foot prn flares 45 g 3   mupirocin  ointment (BACTROBAN ) 2 % Apply 1 application topically 2 (two) times daily. To lesions on abdomen. 22 g 0   omeprazole  (PRILOSEC) 20 MG capsule TAKE 1 CAPSULE (20 MG TOTAL) BY MOUTH DAILY. TAKE 30 MINUTES TO ONE HOUR BEFORE BREAKFAST 90 capsule 0   polyethylene glycol (MIRALAX  / GLYCOLAX ) 17 g packet Take 17 g by mouth daily. 30 each 0   Ruxolitinib Phosphate  (OPZELURA ) 1.5 % CREA Apply to hands once a day 60 g 2   saccharomyces boulardii (FLORASTOR) 250 MG capsule Take 1 capsule (250 mg total) by mouth 2 (two) times daily. 60  capsule 0   sertraline  (ZOLOFT ) 50 MG tablet Take 1 tablet (50 mg total) by mouth at bedtime. 90 tablet 3   tirzepatide  (MOUNJARO ) 2.5 MG/0.5ML Pen Inject 2.5 mg into the skin once a week. 2 mL 2   triamcinolone  (KENALOG ) 0.025 % cream Apply 1 application topically 2 (two) times daily. 80 g 2   [DISCONTINUED] fluticasone  (FLONASE ) 50 MCG/ACT nasal spray Place 2 sprays into both nostrils daily. 16 g 0   No current facility-administered medications on file prior to visit.    Review of Systems  Constitutional:  Positive for fatigue. Negative for chills and fever.  Respiratory:  Negative for cough.   Cardiovascular:  Negative for chest pain and palpitations.  Gastrointestinal:  Negative for nausea and vomiting.  Skin:  Positive for rash.  Psychiatric/Behavioral:  Positive for sleep disturbance.       Objective:    BP 136/72   Pulse 83   Temp 98 F (36.7 C) (Oral)   Ht 5' 1 (1.549 m)   Wt (!) 316 lb (143.3 kg)   LMP  (LMP Unknown)   SpO2 95%   BMI 59.71 kg/m  BP Readings from Last 3 Encounters:  06/07/24 136/72  05/25/24 (!) 142/78  05/22/24 124/72   Wt Readings from Last 3 Encounters:  06/07/24 (!) 316 lb (143.3 kg)  05/25/24 (!) 319 lb 10.7 oz (145 kg)   05/15/24 (!) 319 lb 10.7 oz (145 kg)    Physical Exam Vitals reviewed.  Constitutional:      Appearance: She is well-developed.  Eyes:     Conjunctiva/sclera: Conjunctivae normal.  Cardiovascular:     Rate and Rhythm: Normal rate and regular rhythm.     Pulses: Normal pulses.     Heart sounds: Normal heart sounds.  Pulmonary:     Effort: Pulmonary effort is normal.     Breath sounds: Normal breath sounds. No wheezing, rhonchi or rales.  Skin:    General: Skin is warm and dry.         Comments: Hyper erythematous contiguous raw appearing rash under skin folds of breasts and bl groin. No vesicular lesions or purulent discharge. Erythematous satellite lesions present.   Neurological:     Mental Status: She is alert.  Psychiatric:        Speech: Speech normal.        Behavior: Behavior normal.        Thought Content: Thought content normal.

## 2024-06-07 NOTE — Assessment & Plan Note (Signed)
 Extensive candida infection. Start diflucan 150mg  today and repeat in 3 days time. Provided topical kenalog  0.5% and clotrimazole  to use in the beginning as well. Counseled on limiting use of kenalog  0.5% to < 7 days and not to combine with other topical , or older, prescriptions of topical corticosteroids.

## 2024-06-07 NOTE — Assessment & Plan Note (Signed)
 Chronic, uncontrolled. Discussed altered sleep -wake cycle and how to slowly decrease nap time during the day with use of an alarm clock. Advised not to nap more than 30 min/day. Counseled on sleep hygiene and avoidance of blue light. Continue zoloft  50mg . Stop buspar  ( ineffective). Trial of trazodone 25-50mg  at bedtime.

## 2024-06-07 NOTE — Assessment & Plan Note (Signed)
 Uncontrolled. We opted not to reduce lantus  from 18 units to 10 units due to concern for hyperglycemia. She will start mounjaro  2.5mg  and follow up in 2 weeks time to review blood sugar.

## 2024-06-08 ENCOUNTER — Ambulatory Visit: Payer: Self-pay

## 2024-06-08 NOTE — Telephone Encounter (Signed)
 This RN made the 1st attempt to contact patient. No answer, left voicemail with call back number    Copied from CRM #[id number]. Topic: Clinical - Medication Question >> Jun 08, 2024  1:19 PM Danielle Browning wrote: Reason for CRM: Patient is calling to check on Libre 3 Plus and Mounjaro . Please call patient.

## 2024-06-08 NOTE — Telephone Encounter (Signed)
 This RN made the 2nd attempt to contact patient. No answer and mailbox full. Unable to leave call back number.

## 2024-06-08 NOTE — Telephone Encounter (Signed)
 LKVM to call back to office to discuss message below

## 2024-06-08 NOTE — Telephone Encounter (Unsigned)
 Copied from CRM #[id number]. Topic: General - Other >> Jun 08, 2024  1:11 PM Drema MATSU wrote: Reason for CRM: Patient had an appointment and was given her short term disability forms. She stated that she did not receive the completed forms. She only got the part she completed.

## 2024-06-08 NOTE — Telephone Encounter (Signed)
 FYI Only or Action Required?: Action required by provider: clinical question for provider and request for documentation or forms. Awaiting orders for [facility] and updated STD paperwork  Patient was last seen in primary care on 06/07/2024 by Danielle Rollene MATSU, FNP.  Called Nurse Triage reporting No chief complaint on file..  Symptoms began na.  Interventions attempted: Other: na.  Symptoms are: na.  Triage Disposition: No disposition on file.  Patient/caregiver understands and will follow disposition?:   Copied from CRM [id number]. Topic: Clinical - Medication Question >> Jun 08, 2024  1:19 PM Danielle Browning wrote: Reason for CRM: Patient is calling to check on Libre 3 Plus and Mounjaro . Please call patient. Answer Assessment - Initial Assessment Questions 1. REASON FOR CALL or QUESTION: What is your reason for calling today? or How can I best     Patient calling to follow up on Mounjaro  and order for [facility] 3 Plus glucose monitor. Patient states that she got the notification the Mounjaro  was approved but hasn't heard about the monitor. She is also asking about her STD paperwork. They gave her blank forms the other day and she is looking to pick the papers up by [facility] 10/8. Please call patient back with response  Protocols used: PCP Call - No Triage-A-AH

## 2024-06-08 NOTE — Telephone Encounter (Signed)
 LVM to call back to office to discuss

## 2024-06-11 MED ORDER — FREESTYLE LIBRE 3 PLUS SENSOR MISC: 2 refills | Status: DC

## 2024-06-11 NOTE — Telephone Encounter (Signed)
 LVM to call back to office to discuss message below

## 2024-06-11 NOTE — Addendum Note (Signed)
 Addended by: Jorene Kaylor on: 06/11/2024 11:00 AM   Modules accepted: Orders

## 2024-06-15 NOTE — Telephone Encounter (Unsigned)
 Copied from CRM [id number]. Topic: General - Other >> Jun 15, 2024  2:41 PM Thersia BROCKS wrote: Reason for CRM: Patient called in regarding paperwork, would like a callback to followup

## 2024-06-17 ENCOUNTER — Other Ambulatory Visit: Payer: Self-pay | Admitting: Family

## 2024-06-17 DIAGNOSIS — M545 Low back pain, unspecified: Secondary | ICD-10-CM

## 2024-06-17 DIAGNOSIS — M2550 Pain in unspecified joint: Secondary | ICD-10-CM

## 2024-06-18 ENCOUNTER — Other Ambulatory Visit

## 2024-06-18 ENCOUNTER — Telehealth: Payer: Self-pay

## 2024-06-18 NOTE — Telephone Encounter (Signed)
 See previous note have already spoke to pt regarding Disability paperwork

## 2024-06-18 NOTE — Telephone Encounter (Signed)
 Copied from CRM [id number]. Topic: Medical Record Request - Other >> Jun 18, 2024  2:39 PM Harlene ORN wrote: Reason for CRM: Patient called to follow up on her Short Term Disability Papers that were returned to her blank after they were printed out by the front desk. Please call back the patient to clarify. Also, please follow up with her about getting scheduled for a sleep study

## 2024-06-18 NOTE — Telephone Encounter (Signed)
 Spoke to pt informed her that the paperwork has been completed and she can piock up, pt also asked about referral for sleep -study that yall discussed

## 2024-06-20 NOTE — Telephone Encounter (Signed)
 Pt has been notified via [facility] upfront

## 2024-06-20 NOTE — Telephone Encounter (Signed)
LVM to call back to discuss message below.

## 2024-06-21 ENCOUNTER — Ambulatory Visit: Admitting: Family

## 2024-06-29 ENCOUNTER — Other Ambulatory Visit: Payer: Self-pay | Admitting: Family

## 2024-06-29 DIAGNOSIS — F411 Generalized anxiety disorder: Secondary | ICD-10-CM

## 2024-07-02 ENCOUNTER — Ambulatory Visit: Admitting: Family

## 2024-07-02 ENCOUNTER — Encounter: Payer: Self-pay | Admitting: Family

## 2024-07-02 VITALS — BP 122/84 | HR 76 | Temp 98.7°F | Ht 61.0 in | Wt 316.4 lb

## 2024-07-02 DIAGNOSIS — F419 Anxiety disorder, unspecified: Secondary | ICD-10-CM

## 2024-07-02 DIAGNOSIS — F32A Depression, unspecified: Secondary | ICD-10-CM | POA: Diagnosis not present

## 2024-07-02 DIAGNOSIS — F411 Generalized anxiety disorder: Secondary | ICD-10-CM | POA: Diagnosis not present

## 2024-07-02 DIAGNOSIS — Z0100 Encounter for examination of eyes and vision without abnormal findings: Secondary | ICD-10-CM

## 2024-07-02 DIAGNOSIS — E1165 Type 2 diabetes mellitus with hyperglycemia: Secondary | ICD-10-CM

## 2024-07-02 DIAGNOSIS — Z7985 Long-term (current) use of injectable non-insulin antidiabetic drugs: Secondary | ICD-10-CM

## 2024-07-02 MED ORDER — TIRZEPATIDE 5 MG/0.5ML ~~LOC~~ SOAJ: 5.0000 mg | SUBCUTANEOUS | 2 refills | Status: DC

## 2024-07-02 MED ORDER — FREESTYLE LIBRE 3 PLUS SENSOR MISC: 2 refills | Status: AC

## 2024-07-02 MED ORDER — SERTRALINE HCL 50 MG PO TABS
100.0000 mg | ORAL_TABLET | Freq: Every day | ORAL | 3 refills | Status: DC
Start: 2024-07-02 — End: 2024-07-26

## 2024-07-02 MED ORDER — INSULIN GLARGINE 100 UNIT/ML ~~LOC~~ SOLN: 18.0000 [IU] | Freq: Every day | SUBCUTANEOUS | 11 refills | Status: DC

## 2024-07-02 NOTE — Assessment & Plan Note (Addendum)
 Reviewed CGM data.  Average 228 over 30 days GMI 8.8 45% 181-250 > 250 32%  1 hypoglycemic episode in early morning.  As glucose data is trending upward, we jointly agreed to increase Mounjaro  to 5 mg.  Continue Lantus  18 units.  Plan to reduce insulin  at follow up as escalate Mounjaro . Counseled on vigilance as a relates hypoglycemic episodes.

## 2024-07-02 NOTE — Progress Notes (Unsigned)
 Assessment & Plan:  Eye exam, routine  Uncontrolled type 2 diabetes mellitus with hyperglycemia, without long-term current use of insulin  [facility]) Assessment & Plan: Reviewed CGM data.  Average 228 over 30 days GMI 8.8 45% 181-250 > 250 32%  1 hypoglycemic episode in early morning.  As glucose data is trending upward, we jointly agreed to increase Mounjaro  to 5 mg.  Continue Lantus  18 units.  Plan to reduce insulin  at follow up as escalate Mounjaro . Counseled on vigilance as a relates hypoglycemic episodes.   GAD (generalized anxiety disorder) -     Sertraline  HCl; Take 2 tablets (100 mg total) by mouth at bedtime.  Dispense: 180 tablet; Refill: 3  Anxiety and depression Assessment & Plan: Chronic suboptimal.  Discussed significant familial conflict.  Fortunately, situation is improved.  She will reach out to EAP counseling through work.  Increase Zoloft  to 100 mg nightly, continue trazodone 25 to 50 mg nightly.   Other orders -     FreeStyle Libre 3 Plus Sensor; Change sensor every 15 days.  Dispense: 4 each; Refill: 2 -     Insulin  Glargine; Inject 0.18 mLs (18 Units total) into the skin daily.  Dispense: 10 mL; Refill: 11 -     Tirzepatide ; Inject 5 mg into the skin once a week.  Dispense: 6 mL; Refill: 2     Return precautions given.   Risks, benefits, and alternatives of the medications and treatment plan prescribed today were discussed, and patient expressed understanding.   Education regarding symptom management and diagnosis given to patient on AVS either electronically or printed.  No follow-ups on file.  Rollene Northern, FNP  Subjective:    Patient ID: Danielle Browning, female    DOB: [birth year], 48 y.o.   MRN: [id number]  CC: Danielle Brow[facility] is a 48 y.o. female who presents today for follow up.   HPI: HPI Discussed the use of AI scribe software for clinical note transcription with the patient, who gave verbal consent to proceed.  History of Present Illness   Danielle Browning is a 48 year old female with diabetes who presents for follow-up of her blood sugar management.  She experienced a recent episode where her blood sugar level spiked to over 300 mg/dL, which she describes as an isolated incident. She is currently on Lantus  at 18 units and Mounjaro  at 2.5 mg. She also experienced a low blood sugar episode in the sixties during the nighttime, which was also a one-time occurrence. Her blood sugar levels generally range between 180 and 220 mg/dL. She has been on Mounjaro  for a month.  Her continuous glucose monitor sensor came off after four or five days, and she has not had a sensor for two weeks. Her glucose management indicator (GMI) over the past 30 days is 8.8%.  She has had self-limiting and transient nausea on the day of administration Mounjaro , overall tolerating well without constipation.  She describes significant family stress, including conflicts with her children and tension in her household.  Her husband is supportive, but her children have had conflicts with him, leading to a strained family dynamic. She has been dealing with these issues for several years, which has impacted her mental health.  However she does feel the conflicts are improved. She is compliant with zoloft  50mg  every day, trazodone 25-50mg  Denies suicidal ideation.  Appointment with Dr Isaiah 07/13/24 Allergies: Penicillins, Other, Fruit & vegetable daily [nutritional supplements], Sulfa antibiotics, and Rocephin  [ceftriaxone ] Current Outpatient Medications on File Prior to Visit  Medication Sig Dispense Refill   albuterol  (VENTOLIN  HFA) 108 (90 Base) MCG/ACT inhaler Inhale 2 puffs into the lungs every 6 (six) hours as needed for wheezing or shortness of breath. 6.7 g 0   apixaban  (ELIQUIS ) 5 MG TABS tablet Take 1 tablet (5 mg total) by mouth 2 (two) times daily. 180 tablet 3   Cholecalciferol  (VITAMIN D3 PO) Take 4,000 Int'l Units by mouth.     clobetasol  ointment (TEMOVATE ) 0.05 %  Apply to affected skin qd-bid prn, Avoid applying to face, groin, and axilla. Use as directed. Long-term use can cause thinning of the skin. 30 g 2   clotrimazole  (LOTRIMIN ) 1 % cream Apply 1 Application topically 2 (two) times daily. 30 g 2   Clotrimazole  1 % OINT Apply 1 application topically 2 (two) times daily. 56.7 g 1   Dupilumab  (DUPIXENT ) 300 MG/2ML SOAJ INJECT 300 MG UNDER THE SKIN EVERY 14 DAYS 4 mL 11   gabapentin  (NEURONTIN ) 300 MG capsule TAKE 1 CAPSULE BY MOUTH THREE TIMES A DAY 90 capsule 1   hydrOXYzine  (ATARAX ) 25 MG tablet Take 25 mg by mouth 3 (three) times daily.     insulin  glargine-yfgn (SEMGLEE ) 100 UNIT/ML Pen Inject 18 Units into the skin at bedtime. 15 mL 0   Insulin  Pen Needle (EMBECTA PEN NEEDLE NANO) 32G X 4 MM MISC 1 Dose by Does not apply route daily. 30 each 0   levocetirizine (XYZAL) 5 MG tablet Take 5 mg by mouth every evening.     meloxicam  (MOBIC ) 15 MG tablet TAKE 1 TABLET (15 MG TOTAL) BY MOUTH DAILY AS NEEDED FOR PAIN WITH FOOD 30 tablet 1   mometasone  (ELOCON ) 0.1 % cream Apply 1 application. topically daily as needed (Rash). Qd up to 5 days a week to eczema on hands and left foot prn flares 45 g 3   mupirocin  ointment (BACTROBAN ) 2 % Apply 1 application topically 2 (two) times daily. To lesions on abdomen. 22 g 0   omeprazole  (PRILOSEC) 20 MG capsule TAKE 1 CAPSULE (20 MG TOTAL) BY MOUTH DAILY. TAKE 30 MINUTES TO ONE HOUR BEFORE BREAKFAST 90 capsule 0   polyethylene glycol (MIRALAX  / GLYCOLAX ) 17 g packet Take 17 g by mouth daily. 30 each 0   Ruxolitinib Phosphate  (OPZELURA ) 1.5 % CREA Apply to hands once a day 60 g 2   saccharomyces boulardii (FLORASTOR) 250 MG capsule Take 1 capsule (250 mg total) by mouth 2 (two) times daily. 60 capsule 0   traZODone (DESYREL) 50 MG tablet TAKE 0.5-1 TABLETS BY MOUTH AT BEDTIME AS NEEDED FOR SLEEP. 90 tablet 3   triamcinolone  (KENALOG ) 0.025 % cream Apply 1 application topically 2 (two) times daily. 80 g 2   triamcinolone   ointment (KENALOG ) 0.5 % Apply 1 Application topically 2 (two) times daily. Use sparingly for < 1 week. Do not use internal genitalia 15 g 2   valACYclovir  (VALTREX ) 500 MG tablet Take 500 mg PO x q 12h x 3 days. ASAP w/in 24h of sx onset. 30 tablet 2   [DISCONTINUED] fluticasone  (FLONASE ) 50 MCG/ACT nasal spray Place 2 sprays into both nostrils daily. 16 g 0   No current facility-administered medications on file prior to visit.    Review of Systems  Constitutional:  Negative for chills and fever.  Respiratory:  Negative for cough.   Cardiovascular:  Negative for chest pain and palpitations.  Gastrointestinal:  Negative for nausea and vomiting.      Objective:    BP 122/84  Pulse 76   Temp 98.7 F (37.1 C) (Oral)   Ht 5' 1 (1.549 m)   Wt (!) 316 lb 6.4 oz (143.5 kg)   SpO2 96%   BMI 59.78 kg/m  BP Readings from Last 3 Encounters:  07/02/24 122/84  06/07/24 136/72  05/25/24 (!) 142/78   Wt Readings from Last 3 Encounters:  07/02/24 (!) 316 lb 6.4 oz (143.5 kg)  06/07/24 (!) 316 lb (143.3 kg)  05/25/24 (!) 319 lb 10.7 oz (145 kg)      07/02/2024    1:21 PM 05/25/2024   11:14 AM 05/15/2024    8:36 AM  Depression screen PHQ 2/9  Decreased Interest 0 0 0  Down, Depressed, Hopeless 0 0 0  PHQ - 2 Score 0 0 0  Altered sleeping 0 0   Tired, decreased energy 0 0   Change in appetite 0 0   Feeling bad or failure about yourself  0 0   Trouble concentrating 0 0   Moving slowly or fidgety/restless 0 0   Suicidal thoughts 0 0   PHQ-9 Score 0 0   Difficult doing work/chores Not difficult at all Not difficult at all       05/25/2024   11:14 AM 07/28/2023    9:22 AM  GAD 7 : Generalized Anxiety Score  Nervous, Anxious, on Edge 0 0  Control/stop worrying 0 0  Worry too much - different things 0 0  Trouble relaxing 0 0  Restless 0 0  Easily annoyed or irritable 0 0  Afraid - awful might happen 0 0  Total GAD 7 Score 0 0  Anxiety Difficulty Not difficult at all Not  difficult at all      Physical Exam Vitals reviewed.  Constitutional:      Appearance: She is well-developed.  Eyes:     Conjunctiva/sclera: Conjunctivae normal.  Cardiovascular:     Rate and Rhythm: Normal rate and regular rhythm.     Pulses: Normal pulses.     Heart sounds: Normal heart sounds.  Pulmonary:     Effort: Pulmonary effort is normal.     Breath sounds: Normal breath sounds. No wheezing, rhonchi or rales.  Skin:    General: Skin is warm and dry.  Neurological:     Mental Status: She is alert.  Psychiatric:        Speech: Speech normal.        Behavior: Behavior normal.        Thought Content: Thought content normal.

## 2024-07-02 NOTE — Patient Instructions (Signed)
 Increase mounjaro  to 5mg .  Please monitor for hypoglycemic episodes.  Please call with blood sugars less than 70 or greater than 250. Increase zoloft  to 100mg .  Very nice seeing you today.  Please let me know how you are doing.  I want to stay in close touch.

## 2024-07-03 NOTE — Assessment & Plan Note (Signed)
 Chronic suboptimal.  Discussed significant familial conflict.  Fortunately, situation is improved.  She will reach out to EAP counseling through work.  Increase Zoloft  to 100 mg nightly, continue trazodone 25 to 50 mg nightly.

## 2024-07-13 ENCOUNTER — Ambulatory Visit: Admitting: Internal Medicine

## 2024-07-13 ENCOUNTER — Encounter: Payer: Self-pay | Admitting: Internal Medicine

## 2024-07-13 VITALS — BP 120/80 | HR 69 | Temp 98.7°F | Ht 61.0 in | Wt 318.8 lb

## 2024-07-13 DIAGNOSIS — Z6841 Body Mass Index (BMI) 40.0 and over, adult: Secondary | ICD-10-CM

## 2024-07-13 DIAGNOSIS — I2699 Other pulmonary embolism without acute cor pulmonale: Secondary | ICD-10-CM

## 2024-07-13 DIAGNOSIS — I2609 Other pulmonary embolism with acute cor pulmonale: Secondary | ICD-10-CM

## 2024-07-13 DIAGNOSIS — G4733 Obstructive sleep apnea (adult) (pediatric): Secondary | ICD-10-CM

## 2024-07-13 DIAGNOSIS — E669 Obesity, unspecified: Secondary | ICD-10-CM

## 2024-07-13 DIAGNOSIS — G4710 Hypersomnia, unspecified: Secondary | ICD-10-CM | POA: Diagnosis not present

## 2024-07-13 DIAGNOSIS — Z86718 Personal history of other venous thrombosis and embolism: Secondary | ICD-10-CM

## 2024-07-13 NOTE — Progress Notes (Signed)
 Name: Danielle Browning MRN: [id number] DOB: 05-02-1976    CHIEF COMPLAINT:  ASSESSMENT OF SLEEP APNEA EXCESSIVE DAYTIME SLEEPINESS Assessment pulmonary embolism history of DVT Recent admission to the hospital for lower leg cellulitis   HISTORY OF PRESENT ILLNESS: Patient is seen today for problems and issues with sleep related to excessive daytime sleepiness Patient  has been having sleep problems for many years Patient has been having excessive daytime sleepiness for a long time Patient has been having extreme fatigue and tiredness, lack of energy +  very Loud snoring every night + struggling breathe at night and gasps for air  Discussed sleep data and reviewed with patient.  Encouraged proper weight management.  Discussed driving precautions and its relationship with hypersomnolence.  Discussed operating dangerous equipment and its relationship with hypersomnolence.  Discussed sleep hygiene, and benefits of a fixed sleep waked time.  The importance of getting eight or more hours of sleep discussed with patient.  Discussed limiting the use of the computer and television before bedtime.  Decrease naps during the day, so night time sleep will become enhanced.  Limit caffeine, and sleep deprivation.  HTN, stroke, and heart failure are potential risk factors.       07/13/2024   10:00 AM  Results of the Epworth flowsheet  Sitting and reading 2  Watching TV 1  Sitting, inactive in a public place (e.g. a theatre or a meeting) 3  As a passenger in a car for an hour without a break 3  Lying down to rest in the afternoon when circumstances permit 3  Sitting and talking to someone 3  Sitting quietly after a lunch without alcohol 0  In a car, while stopped for a few minutes in traffic 1  Total score 16      Recent hospitalization for lower leg cellulitis with a diagnosis of pulmonary embolism CT chest September 2025 Very small volume nonocclusive thrombus in the right lung  lower lobe proximal subsegmental pulmonary artery branch. No associated right heart strain or lung infarction. 2. No lung mass, consolidation, pleural effusion or pneumothorax. No suspicious lung nodule. Patient currently on Eliquis  5 mg twice daily Patient had 1 significant nosebleed however resolved with time and pressure  Patient with previous history of DVT 15 years ago     PAST MEDICAL HISTORY :   has a past medical history of Back pain due to injury, Bilateral swelling of feet, Bronchitis, Cervicalgia, Chronic pain, Constipation, Depression, Dyspnea, Fall, Gallbladder problem, GERD (gastroesophageal reflux disease), H/O motion sickness, Headache, Heart murmur, Hemorrhoids, blood clots, IBS (irritable bowel syndrome), Joint pain, Multiple food allergies, Obesity, Palpitations, Pre-diabetes, SOB (shortness of breath), and Vitamin D  deficiency.  has a past surgical history that includes Cholecystectomy; Foot surgery (Right); arm surgery (Right); Colonoscopy with propofol  (N/A, 12/17/2016); Colonoscopy with propofol  (N/A, 01/06/2022); Esophagogastroduodenoscopy (egd) with propofol  (N/A, 01/06/2022); and Breast biopsy (Left, 06/29/2022). Prior to Admission medications   Medication Sig Start Date End Date Taking? Authorizing Provider  albuterol  (VENTOLIN  HFA) 108 (90 Base) MCG/ACT inhaler Inhale 2 puffs into the lungs every 6 (six) hours as needed for wheezing or shortness of breath. 02/09/20   Moishe Suzen LABOR, NP  apixaban  (ELIQUIS ) 5 MG TABS tablet Take 1 tablet (5 mg total) by mouth 2 (two) times daily. 05/25/24   Dineen Rollene MATSU, FNP  Cholecalciferol  (VITAMIN D3 PO) Take 4,000 Int'l Units by mouth.    [provider]  clobetasol  ointment (TEMOVATE ) 0.05 % Apply to affected skin qd-bid prn, Avoid  applying to face, groin, and axilla. Use as directed. Long-term use can cause thinning of the skin. 05/31/23   Claudene Lehmann, MD  clotrimazole  (LOTRIMIN ) 1 % cream Apply 1  Application topically 2 (two) times daily. 06/07/24   Dineen Rollene MATSU, FNP  Clotrimazole  1 % OINT Apply 1 application topically 2 (two) times daily. 04/07/21   Dineen Rollene MATSU, FNP  Continuous Glucose Sensor (FREESTYLE LIBRE 3 PLUS SENSOR) MISC Change sensor every 15 days. 07/02/24   Dineen Rollene MATSU, FNP  Dupilumab  (DUPIXENT ) 300 MG/2ML SOAJ INJECT 300 MG UNDER THE SKIN EVERY 14 DAYS 02/09/24   Jackquline Sawyer, MD  gabapentin  (NEURONTIN ) 300 MG capsule TAKE 1 CAPSULE BY MOUTH THREE TIMES A DAY 06/18/24   Dineen Rollene MATSU, FNP  hydrOXYzine  (ATARAX ) 25 MG tablet Take 25 mg by mouth 3 (three) times daily. 12/25/23   [provider]  insulin  glargine (LANTUS ) 100 UNIT/ML injection Inject 0.18 mLs (18 Units total) into the skin daily. 07/02/24   Dineen Rollene MATSU, FNP  insulin  glargine-yfgn (SEMGLEE ) 100 UNIT/ML Pen Inject 18 Units into the skin at bedtime. 05/22/24   Josette Ade, MD  Insulin  Pen Needle (EMBECTA PEN NEEDLE NANO) 32G X 4 MM MISC 1 Dose by Does not apply route daily. 05/22/24   Josette Ade, MD  levocetirizine (XYZAL) 5 MG tablet Take 5 mg by mouth every evening. 12/09/23   [provider]  meloxicam  (MOBIC ) 15 MG tablet TAKE 1 TABLET (15 MG TOTAL) BY MOUTH DAILY AS NEEDED FOR PAIN WITH FOOD 03/06/24   Webb, Padonda B, FNP  mometasone  (ELOCON ) 0.1 % cream Apply 1 application. topically daily as needed (Rash). Qd up to 5 days a week to eczema on hands and left foot prn flares 12/14/21   Hester Alm BROCKS, MD  mupirocin  ointment (BACTROBAN ) 2 % Apply 1 application topically 2 (two) times daily. To lesions on abdomen. 04/07/21   Dineen Rollene MATSU, FNP  omeprazole  (PRILOSEC) 20 MG capsule TAKE 1 CAPSULE (20 MG TOTAL) BY MOUTH DAILY. TAKE 30 MINUTES TO ONE HOUR BEFORE BREAKFAST 01/31/24   Dineen Rollene MATSU, FNP  polyethylene glycol (MIRALAX  / GLYCOLAX ) 17 g packet Take 17 g by mouth daily. 05/22/24   Josette Ade, MD  Ruxolitinib Phosphate  (OPZELURA ) 1.5 % CREA Apply to  hands once a day 02/23/22   Hester Alm BROCKS, MD  saccharomyces boulardii (FLORASTOR) 250 MG capsule Take 1 capsule (250 mg total) by mouth 2 (two) times daily. 05/22/24   Josette Ade, MD  sertraline  (ZOLOFT ) 50 MG tablet Take 2 tablets (100 mg total) by mouth at bedtime. 07/02/24   Dineen Rollene MATSU, FNP  tirzepatide  (MOUNJARO ) 5 MG/0.5ML Pen Inject 5 mg into the skin once a week. 07/02/24   Dineen Rollene MATSU, FNP  traZODone (DESYREL) 50 MG tablet TAKE 0.5-1 TABLETS BY MOUTH AT BEDTIME AS NEEDED FOR SLEEP. 07/02/24   Dineen Rollene MATSU, FNP  triamcinolone  (KENALOG ) 0.025 % cream Apply 1 application topically 2 (two) times daily. 04/07/21   Dineen Rollene MATSU, FNP  triamcinolone  ointment (KENALOG ) 0.5 % Apply 1 Application topically 2 (two) times daily. Use sparingly for < 1 week. Do not use internal genitalia 06/07/24   Dineen Rollene MATSU, FNP  valACYclovir  (VALTREX ) 500 MG tablet Take 500 mg PO x q 12h x 3 days. ASAP w/in 24h of sx onset. 06/07/24   Dineen Rollene MATSU, FNP  fluticasone  (FLONASE ) 50 MCG/ACT nasal spray Place 2 sprays into both nostrils daily. 01/10/18 08/29/19  McManama, Katherine D,  FNP   Allergies  Allergen Reactions   Penicillins Anaphylaxis   Other Swelling    Ant bites   Fruit & Vegetable Daily [Nutritional Supplements] Other (See Comments)    Fruits with peels cause blisters in mouth, especially peaches. She can take cooked fruit and supplements.   Sulfa Antibiotics Hives and Itching   Rocephin  [Ceftriaxone ] Rash    FAMILY HISTORY:  family history includes Cancer in her cousin; Colon cancer (age of onset: 30) in her sister; Depression in her mother; Heart attack in her mother; Hyperlipidemia in her mother; Hypertension in her mother; Multiple endocrine neoplasia in an other family member; Obesity in her mother; Ovarian cysts in her sister; Stroke in her mother; Throat cancer in her maternal grandfather. SOCIAL HISTORY:  reports that she has never smoked. She has never  used smokeless tobacco. She reports that she does not drink alcohol and does not use drugs.    BP 120/80   Pulse 69   Temp 98.7 F (37.1 C)   Ht 5' 1 (1.549 m)   Wt (!) 318 lb 12.8 oz (144.6 kg)   LMP  (LMP Unknown)   SpO2 94%   BMI 60.24 kg/m      Review of Systems: Gen:  Denies  fever, sweats, chills weight loss  HEENT: Denies blurred vision, double vision, ear pain, eye pain, hearing loss, nose bleeds, sore throat Cardiac:  No dizziness, chest pain or heaviness, chest tightness,edema, No JVD Resp:   No cough, -sputum production, +shortness of breath,-wheezing, -hemoptysis,  Other:  All other systems negative   Physical Examination:   General Appearance: No distress  EYES PERRLA, EOM intact.   NECK Supple, No JVD Pulmonary: normal breath sounds, No wheezing.  CardiovascularNormal S1,S2.  No m/r/g.   Abdomen: Benign, Soft, non-tender. Neurology UE/LE 5/5 strength, no focal deficits Ext pulses intact, cap refill intact ALL OTHER ROS ARE NEGATIVE     ASSESSMENT AND PLAN SYNOPSIS 48 year old morbidly obese white female patient with signs and symptoms of excessive daytime sleepiness with probable underlying diagnosis of obstructive sleep apnea in the setting of obesity and deconditioned state With underlying diagnosis of small pulmonary embolism with a history of DVT in the past  Recommend home sleep Study for definitve diagnosis  Obesity -recommend significant weight loss -recommend changing diet  Deconditioned state -Recommend increased daily activity and exercise  PE and history of DVT Recommend continuing Eliquis  indefinitely Recommend heme-onc referral to assess hypercoagulable state   MEDICATION ADJUSTMENTS/LABS AND TESTS ORDERED: Recommend Sleep Study Recommend weight loss Continue Eliquis  Referral to hematology oncology   CURRENT MEDICATIONS REVIEWED AT LENGTH WITH PATIENT TODAY   Patient  satisfied with Plan of action and management. All  questions answered   Follow up 3 months   I spent a total of 68 minutes dedicated to the care of this patient on the date of this encounter to include pre-visit review of records, face-to-face time with the patient discussing conditions above, post visit ordering of testing, clinical documentation with the electronic health record, making appropriate referrals as documented, and communicating necessary information to the patient's healthcare team.     Nickolas Alm Cellar, M.D.  [facility]  Medical Director [facility] [place]

## 2024-07-13 NOTE — Patient Instructions (Addendum)
   Recommend home sleep test Continue medication for blood clots in the lungs Referral to hematology oncology to assess for hypercoagulable state

## 2024-07-17 ENCOUNTER — Ambulatory Visit: Admitting: Family

## 2024-07-18 NOTE — Telephone Encounter (Signed)
 Nothing to be done at this time

## 2024-07-19 ENCOUNTER — Telehealth: Payer: Self-pay | Admitting: Oncology

## 2024-07-19 ENCOUNTER — Encounter: Admitting: Family

## 2024-07-19 NOTE — Telephone Encounter (Signed)
 Per referral inbasket schedule pt for next available appt- to re-establish care. MD followed by labs    I called and left vm for pt to call back to schedule appts. Scheduling phone number provided.

## 2024-07-25 ENCOUNTER — Encounter: Payer: Self-pay | Admitting: Dietician

## 2024-07-25 ENCOUNTER — Encounter: Attending: Internal Medicine | Admitting: Dietician

## 2024-07-25 DIAGNOSIS — Z713 Dietary counseling and surveillance: Secondary | ICD-10-CM | POA: Insufficient documentation

## 2024-07-25 DIAGNOSIS — E1165 Type 2 diabetes mellitus with hyperglycemia: Secondary | ICD-10-CM | POA: Insufficient documentation

## 2024-07-25 NOTE — Patient Instructions (Addendum)
 Check your blood sugar each morning before eating or drinking (fasting). Look for numbers between 70-130 mg/dL Check your blood sugar 2 hours after you begin eating a meal. Look for numbers under 180 mg/dL at all times.  Your goal A1c is below 7.0%  Desired Ranges on GCM Report: Very Low (below 54 mg/dL): <8% Low (below 70 mg/dL): <5% Time in Range (70 - 180 mg/dL): >29% High (819 - 749 mg/dL): <74% Very High (above 250 mg/dL): <4%  Work towards eating three meals a day, about 5-6 hours apart!  Begin to recognize carbohydrates, proteins, and non-starchy vegetables in your food choices!  Begin to build your meals using the proportions of the Balanced Plate. First, select your carb choice(s) for the meal. Make this 25% of your meal. Next, select your source of protein to pair with your carb choice(s). Make this another 25% of your meal. Finally, complete your meal with a variety of non-starchy vegetables. Make this the remaining 50% of your meal.

## 2024-07-25 NOTE — Progress Notes (Signed)
 Diabetes Self-Management Education  Visit Type: First/Initial  Appt. Start Time: 0940 Appt. End Time: 1120  07/25/2024  [name], identified by name and date of birth, is a 48 y.o. female with a diagnosis of Diabetes: Type 2.   ASSESSMENT Pt reports high stress related to family conflicts over the last year, states they moved to a new house with their husband and has less stress Pt reports going to ED for cellulitis in September, reports feeling much better now. Pt reports dealing with chronic pain in their low back, arthritis in R hip, degenerated discs in neck/low back, makes physical activity difficult/painful. Pt reports taking Mounjaro  @5mg , has been taking Semglee  at 2 clicks, was then prescribed Lantus  @18u  but was given a vial and syringes instead of KwikPen, so pt is finishing up remaining Semglee  and trying to get correct Lantus . Pt reports using CGM, Libre3 CGM Results from download:   Average glucose:   180 mg/dL for 48 days  Glucose management indicator:   6.9 %  Time in range (70-180 mg/dL):   61 %   (Goal >29%)  Time High (181-250 mg/dL):   28 %   (Goal < 74%)  Time Very High (>250 mg/dL):    11 %   (Goal < 5%)  Time Low (54-69 mg/dL):   0 %   (Goal <5%)  Time Very Low (<54 mg/dL):   0 %   (Goal <8%)     Diabetes Self-Management Education - 07/25/24 1016       Visit Information   Visit Type First/Initial      Initial Visit   Diabetes Type Type 2    Date Diagnosed 05/16/2024    Are you currently following a meal plan? No    Are you taking your medications as prescribed? Yes      Health Coping   How would you rate your overall health? Fair      Psychosocial Assessment   Patient Belief/Attitude about Diabetes Other (comment)   sad/overwhelmed   What is the hardest part about your diabetes right now, causing you the most concern, or is the most worrisome to you about your diabetes?   Making healty food and beverage choices    Self-care barriers None     Self-management support Doctor's office    Other persons present Patient    Patient Concerns Nutrition/Meal planning;Glycemic Control    Special Needs None    Preferred Learning Style Hands on    Learning Readiness Not Ready    How often do you need to have someone help you when you read instructions, pamphlets, or other written materials from your doctor or pharmacy? 1 - Never    What is the last grade level you completed in school? some college      Pre-Education Assessment   Patient understands the diabetes disease and treatment process. Needs Instruction    Patient understands incorporating nutritional management into lifestyle. Needs Instruction    Patient undertands incorporating physical activity into lifestyle. Needs Instruction    Patient understands using medications safely. Needs Instruction    Patient understands monitoring blood glucose, interpreting and using results Needs Instruction    Patient understands prevention, detection, and treatment of acute complications. Needs Instruction    Patient understands prevention, detection, and treatment of chronic complications. Needs Instruction    Patient understands how to develop strategies to address psychosocial issues. Needs Instruction    Patient understands how to develop strategies to promote health/change behavior. Needs Instruction  Complications   Last HgB A1C per patient/outside source 8.2 %   05/21/2024   How often do you check your blood sugar? > 4 times/day   CGM   Fasting Blood glucose range (mg/dL) 869-820;29-870    Postprandial Blood glucose range (mg/dL) 869-820;819-799;>799    Have you had a dilated eye exam in the past 12 months? No    Have you had a dental exam in the past 12 months? Yes    Are you checking your feet? Yes    How many days per week are you checking your feet? 4      Dietary Intake   Lunch Tomato soup, Swamp soup (kielbasa, kale, navy beans, onion, tomato, beef broth)    Snack  (afternoon) Chicken and rice soup, Turkey chili w/ a little cheese, piece of pound cake    Beverage(s) Water, 20 oz Coke ZERO      Activity / Exercise   Activity / Exercise Type ADL's    How many days per week do you exercise? 0    How many minutes per day do you exercise? 0    Total minutes per week of exercise 0      Patient Education   Previous Diabetes Education No    Disease Pathophysiology Factors that contribute to the development of diabetes;Explored patient's options for treatment of their diabetes;Definition of diabetes, type 1 and 2, and the diagnosis of diabetes    Healthy Eating Role of diet in the treatment of diabetes and the relationship between the three main macronutrients and blood glucose level;Plate Method;Meal options for control of blood glucose level and chronic complications.;Information on hints to eating out and maintain blood glucose control.    Being Active Helped patient identify appropriate exercises in relation to his/her diabetes, diabetes complications and other health issue.    Medications Taught/reviewed insulin /injectables, injection, site rotation, insulin /injectables storage and needle disposal.    Monitoring Taught/evaluated CGM (comment)    Acute complications Taught prevention, symptoms, and  treatment of hypoglycemia - the 15 rule.    Chronic complications Relationship between chronic complications and blood glucose control    Diabetes Stress and Support Role of stress on diabetes;Worked with patient to identify barriers to care and solutions;Identified and addressed patients feelings and concerns about diabetes    Lifestyle and Health Coping Lifestyle issues that need to be addressed for better diabetes care      Individualized Goals (developed by patient)   Nutrition Follow meal plan discussed;General guidelines for healthy choices and portions discussed    Physical Activity Exercise 1-2 times per week    Medications take my medication as  prescribed    Monitoring  Consistenly use CGM    Problem Solving Eating Pattern    Reducing Risk examine blood glucose patterns    Health Coping Ask for help with psychological, social, or emotional issues;Discuss barriers to diabetes care with support person/system (comment specifics as needed)      Post-Education Assessment   Patient understands the diabetes disease and treatment process. Needs Review    Patient understands incorporating nutritional management into lifestyle. Needs Review    Patient undertands incorporating physical activity into lifestyle. Needs Review    Patient understands using medications safely. Needs Review    Patient understands monitoring blood glucose, interpreting and using results Needs Review    Patient understands prevention, detection, and treatment of acute complications. Needs Review    Patient understands prevention, detection, and treatment of chronic complications. Needs Review  Patient understands how to develop strategies to address psychosocial issues. Needs Review    Patient understands how to develop strategies to promote health/change behavior. Needs Review      Outcomes   Expected Outcomes Demonstrated interest in learning but significant barriers to change    Future DMSE 4-6 wks    Program Status Not Completed          Individualized Plan for Diabetes Self-Management Training:   Learning Objective:  Patient will have a greater understanding of diabetes self-management. Patient education plan is to attend individual and/or group sessions per assessed needs and concerns.   Plan:   Patient Instructions  Check your blood sugar each morning before eating or drinking (fasting). Look for numbers between 70-130 mg/dL Check your blood sugar 2 hours after you begin eating a meal. Look for numbers under 180 mg/dL at all times.  Your goal A1c is below 7.0%  Desired Ranges on GCM Report: Very Low (below 54 mg/dL): <8% Low (below 70  mg/dL): <5% Time in Range (70 - 180 mg/dL): >29% High (819 - 749 mg/dL): <74% Very High (above 250 mg/dL): <4%  Work towards eating three meals a day, about 5-6 hours apart!  Begin to recognize carbohydrates, proteins, and non-starchy vegetables in your food choices!  Begin to build your meals using the proportions of the Balanced Plate. First, select your carb choice(s) for the meal. Make this 25% of your meal. Next, select your source of protein to pair with your carb choice(s). Make this another 25% of your meal. Finally, complete your meal with a variety of non-starchy vegetables. Make this the remaining 50% of your meal.     Expected Outcomes:  Demonstrated interest in learning but significant barriers to change  Education material provided: ADA - How to Thrive: A Guide for Your Journey with Diabetes and My Plate, CGM Overview, CGM and Nutrition Conversation  If problems or questions, patient to contact team via:  Phone and Email  Future DSME appointment: 4-6 wks

## 2024-07-26 ENCOUNTER — Telehealth: Payer: Self-pay | Admitting: Oncology

## 2024-07-26 ENCOUNTER — Encounter: Payer: Self-pay | Admitting: Family

## 2024-07-26 ENCOUNTER — Ambulatory Visit: Admitting: Family

## 2024-07-26 VITALS — BP 122/78 | HR 74 | Temp 98.5°F | Ht 59.0 in | Wt 314.6 lb

## 2024-07-26 DIAGNOSIS — F32A Depression, unspecified: Secondary | ICD-10-CM

## 2024-07-26 DIAGNOSIS — E1165 Type 2 diabetes mellitus with hyperglycemia: Secondary | ICD-10-CM

## 2024-07-26 DIAGNOSIS — F419 Anxiety disorder, unspecified: Secondary | ICD-10-CM

## 2024-07-26 DIAGNOSIS — Z7985 Long-term (current) use of injectable non-insulin antidiabetic drugs: Secondary | ICD-10-CM | POA: Diagnosis not present

## 2024-07-26 DIAGNOSIS — R040 Epistaxis: Secondary | ICD-10-CM | POA: Diagnosis not present

## 2024-07-26 DIAGNOSIS — F411 Generalized anxiety disorder: Secondary | ICD-10-CM | POA: Diagnosis not present

## 2024-07-26 MED ORDER — SERTRALINE HCL 50 MG PO TABS: 150.0000 mg | ORAL_TABLET | Freq: Every day | ORAL | 3 refills | Status: AC

## 2024-07-26 MED ORDER — OXYMETAZOLINE HCL 0.05 % NA SOLN: NASAL | 1 refills | Status: DC

## 2024-07-26 NOTE — Patient Instructions (Addendum)
 Stop SEMGLEE  ( insulin )  Call me if fasting blood sugars > 200 and we discuss if needed a low dose insulin .   Increase mounjaro  to 7.5mg  in  4 weeks   Treatment with included instructions on how to use oxymetazoline  ( Afrin) .    Follow these steps to treat a common nosebleed.  Sit up and lean forward. Keep the head up. Lean forward so the blood doesn't go down the throat. This could cause you to choke or have an upset stomach. Gently blow your nose. This will clear any blood clots. Pinch the nose. Use the thumb and a finger to pinch both nostrils shut. Breathe through the mouth. Keep pinching for 10 to 15 minutes. Pinching puts pressure on the blood vessels and helps stop the blood flow.  If the bleeding doesn't stop, pinch the nose again for up to 15 minutes. Don't let go for at least five minutes even to check if the bleeding has stopped. Seek emergency care if the bleeding doesn't stop after the second try.  Prevent another nosebleed. Don't pick or blow the nose. And don't drop the head below the heart or lift anything heavy for many hours. Gently put a saline gel (Ayr), antibiotic ointment (Neosporin) or petroleum jelly (Vaseline) on the inside of the nose. Put most of the salve on the middle part of the nose, also called the septum. Steam, humidifiers or an ice pack across the bridge of the nose also may help.  If you have another nosebleed, try first-aid steps again. This time, spray both sides of the nose with a nasal spray that has oxymetazoline  in it (Afrin). Do this after blowing the nose. Then pinch the nose again. Seek medical help if the bleeding does not stop.   Nosebleed, Adult A nosebleed is when blood comes out of the nose. Nosebleeds are common. Usually, they are not a sign of a serious condition. Nosebleeds can happen if a blood vessel in your nose starts to bleed or if the lining of your nose (mucous membrane) cracks. They are commonly caused  by: Allergies. Colds. Picking your nose. Blowing your nose too hard. An injury from sticking an object into your nose or getting hit in the nose. Dry or cold air. Less common causes of nosebleeds include: Toxic fumes. Something abnormal in the nose or in the air-filled spaces in the bones of the face (sinuses). Growths in the nose, such as polyps. Blood thinners or conditions that cause blood to clot slowly. Certain illnesses or procedures that irritate or dry out the nasal passages. Follow these instructions at home: When you have a nosebleed:  Sit down and tilt your head slightly forward. Use a clean towel or tissue to pinch your nostrils under the bony part of your nose. After 5 minutes, let go of your nose and see if the bleeding starts again. Do not release pressure before that time. If there is still bleeding, repeat the pinching and holding for 5 minutes or until the bleeding stops. Do not place tissues or gauze in the nose to stop the bleeding. Avoid lying down and avoid tilting your head backward. That may cause blood to collect in the throat and cause gagging or coughing. Use a nasal spray decongestant to help with a nosebleed as told by your health care provider. After a nosebleed: Avoid blowing your nose or sniffing for a number of hours. Avoid straining, lifting, or bending at the waist for several days. You may go back to  other normal activities as you are able. If you are taking aspirin or blood thinners and you have nosebleeds, talk to your health care provider. These medicines make bleeding more likely. Ask your health care provider if you should stop taking the medicines or if you should adjust the dose. Do not stop taking medicines that your health care provider has recommended unless he or she tells you to stop taking them. If your nosebleed was caused by dry mucous membranes, use over-the-counter saline nasal spray or gel and a humidifier as told by your health care  provider. This will keep the mucous membranes moist and allow them to heal. If you need to use a nasal spray or gel: Choose one that is water-soluble. Use only as much as you need and use it only as often as needed. Do not lie down right after you use it. If you get nosebleeds often, talk with your health care provider about medical treatments. Options may include: Nasal cautery. This treatment stops and prevents nosebleeds by using a chemical swab or electrical device to lightly burn tiny blood vessels inside the nose. Nasal packing. A gauze or other material is placed in the nose to keep constant pressure on the bleeding area. Contact a health care provider if: You have a fever. You get nosebleeds often or more often than usual. You bruise very easily. You have a nose bleed from having something stuck in your nose. You have bleeding in your mouth. You vomit or cough up brown material. You have a nosebleed after you start a new medicine. Get help right away if: You have a nosebleed after a fall or a head injury. Your nosebleed does not go away after 20 minutes. You feel dizzy or weak. You have unusual bleeding from other parts of your body. You have unusual bruising on other parts of your body. You get sweaty. You vomit blood. Summary A nosebleed is when blood comes out of the nose. Common causes include allergies, an injury to the nose, or cold or dry air. Initial treatment includes applying pressure for 5 minutes. Moisturizing the nose with saline nasal spray or gel after a nosebleed may help prevent future bleeding. Get help right away if your nosebleed does not go away after 20 minutes. This information is not intended to replace advice given to you by your health care provider. Make sure you discuss any questions you have with your health care provider. Document Revised: 09/01/2021 Document Reviewed: 09/01/2021 Elsevier Patient Education  2024 [employer].

## 2024-07-26 NOTE — Progress Notes (Signed)
 Assessment & Plan:  GAD (generalized anxiety disorder) -     Sertraline  HCl; Take 3 tablets (150 mg total) by mouth at bedtime.  Dispense: 270 tablet; Refill: 3  Epistaxis Assessment & Plan: Controlled at time of exam.  Discussed Eliquis  use and risk of epistaxis. Discussed use of Vaseline, Afrin if needed.  Prescription has been sent. Reiterated the importance of follow-up with hematology.  She will call Dr. Layvonne office to schedule.  I have also placed a referral to ENT for previous history of epistaxis, sinus symptoms.  Orders: -     Oxymetazoline  HCl; Instill 2 to 3 sprays into each nostril twice daily (maximum dose: 2 doses/24 hours). Limit to 3 to 5 days of consecutive use  Dispense: 15 mL; Refill: 1 -     Ambulatory referral to ENT  Uncontrolled type 2 diabetes mellitus with hyperglycemia, without long-term current use of insulin  [facility]) Assessment & Plan: Reviewed CGM with data with patient today.  Blood sugars are improving.  She has been taking 2 units of Semglee  on accident due to number of clicks.  I doubt this is been clinically significant and have advised her to stop Semglee  completely.  Discussed the protein, carbohydrate every couple hours to sustain blood sugar and prevent hypoglycemia.  Advised to contact me if fasting blood sugars > 200 and we can discuss if insulin  needed.  Plan to increase mounjaro  to 7.5mg  in  4 weeks   Anxiety and depression Assessment & Plan: Uncontrolled.  Discussed increasing Zoloft  to 150 mg.  Continue trazodone  50 mg at night.   Close follow-up.      Return precautions given.   Risks, benefits, and alternatives of the medications and treatment plan prescribed today were discussed, and patient expressed understanding.   Education regarding symptom management and diagnosis given to patient on AVS either electronically or printed.  No follow-ups on file.  Rollene Northern, FNP  Subjective:    Patient ID: Danielle Browning, female    DOB:  1975/09/09, 48 y.o.   MRN: [id number]  CC: Danielle Browning is a 48 y.o. female who presents today for follow up.   HPI: HPI Discussed the use of AI scribe software for clinical note transcription with the patient, who gave verbal consent to proceed.  History of Present Illness   Danielle Browning is a 48 year old female with diabetes, h/o pulmonary embolism who presents with concerns about epistaxis and insulin  management.  She has been experiencing multiple episodes of epistaxis over the past two weeks, with three episodes occurring this week. The bleeding is primarily from the left nostril and sometimes results in blood clots. One episode started spontaneously while she was in the shower, and another occurred while driving, necessitating her to stop and manage the bleeding. She has a history of occasional epistaxis, but the frequency has increased since starting Eliquis .  Regarding her diabetes management, she has been using a pen for insulin  but was mistakenly administering only two units instead of the intended dose of 8 units. Her recent fasting blood sugar averages have been 140 over the last 7 days, 151 over the last 14 days, and 178 over the last 90 days. She has been on Mounjaro  5 mg, which she started this week after completing a course of 2.5 mg. She experiences occasional episodes of hypoglycemia, particularly at night, such 55 however she doesn't get out of bed to eat a snack.   She experiences anxiety and depression, particularly related to returning to work  and social interactions. She has been on Zoloft , which she feels has helped, but she still experiences low motivation and prefers to stay at home.  Her current medications include Mounjaro  5 mg, Symglee (insulin ) 2 units daily, Zoloft  100 mg, trazodone  50 mg at night.  She does not smoke or drink alcohol.      Planning appointment with Dr Babara; hematology office has contacted her  Allergies: Penicillins, Other, Fruit & vegetable daily  [nutritional supplements], Sulfa antibiotics, and Rocephin  [ceftriaxone ] Current Outpatient Medications on File Prior to Visit  Medication Sig Dispense Refill   albuterol  (VENTOLIN  HFA) 108 (90 Base) MCG/ACT inhaler Inhale 2 puffs into the lungs every 6 (six) hours as needed for wheezing or shortness of breath. 6.7 g 0   apixaban  (ELIQUIS ) 5 MG TABS tablet Take 1 tablet (5 mg total) by mouth 2 (two) times daily. 180 tablet 3   Cholecalciferol  (VITAMIN D3 PO) Take 4,000 Int'l Units by mouth.     clobetasol  ointment (TEMOVATE ) 0.05 % Apply to affected skin qd-bid prn, Avoid applying to face, groin, and axilla. Use as directed. Long-term use can cause thinning of the skin. 30 g 2   clotrimazole  (LOTRIMIN ) 1 % cream Apply 1 Application topically 2 (two) times daily. 30 g 2   Clotrimazole  1 % OINT Apply 1 application topically 2 (two) times daily. 56.7 g 1   Continuous Glucose Sensor (FREESTYLE LIBRE 3 PLUS SENSOR) MISC Change sensor every 15 days. 4 each 2   Dupilumab  (DUPIXENT ) 300 MG/2ML SOAJ INJECT 300 MG UNDER THE SKIN EVERY 14 DAYS 4 mL 11   gabapentin  (NEURONTIN ) 300 MG capsule TAKE 1 CAPSULE BY MOUTH THREE TIMES A DAY 90 capsule 1   hydrOXYzine  (ATARAX ) 25 MG tablet Take 25 mg by mouth 3 (three) times daily.     Insulin  Pen Needle (EMBECTA PEN NEEDLE NANO) 32G X 4 MM MISC 1 Dose by Does not apply route daily. 30 each 0   levocetirizine (XYZAL) 5 MG tablet Take 5 mg by mouth every evening.     meloxicam  (MOBIC ) 15 MG tablet TAKE 1 TABLET (15 MG TOTAL) BY MOUTH DAILY AS NEEDED FOR PAIN WITH FOOD 30 tablet 1   mometasone  (ELOCON ) 0.1 % cream Apply 1 application. topically daily as needed (Rash). Qd up to 5 days a week to eczema on hands and left foot prn flares 45 g 3   mupirocin  ointment (BACTROBAN ) 2 % Apply 1 application topically 2 (two) times daily. To lesions on abdomen. 22 g 0   omeprazole  (PRILOSEC) 20 MG capsule TAKE 1 CAPSULE (20 MG TOTAL) BY MOUTH DAILY. TAKE 30 MINUTES TO ONE HOUR  BEFORE BREAKFAST 90 capsule 0   polyethylene glycol (MIRALAX  / GLYCOLAX ) 17 g packet Take 17 g by mouth daily. 30 each 0   Ruxolitinib Phosphate  (OPZELURA ) 1.5 % CREA Apply to hands once a day 60 g 2   saccharomyces boulardii (FLORASTOR) 250 MG capsule Take 1 capsule (250 mg total) by mouth 2 (two) times daily. 60 capsule 0   tirzepatide  (MOUNJARO ) 5 MG/0.5ML Pen Inject 5 mg into the skin once a week. 6 mL 2   traZODone  (DESYREL ) 50 MG tablet TAKE 0.5-1 TABLETS BY MOUTH AT BEDTIME AS NEEDED FOR SLEEP. 90 tablet 3   triamcinolone  (KENALOG ) 0.025 % cream Apply 1 application topically 2 (two) times daily. 80 g 2   triamcinolone  ointment (KENALOG ) 0.5 % Apply 1 Application topically 2 (two) times daily. Use sparingly for < 1 week. Do not  use internal genitalia 15 g 2   valACYclovir  (VALTREX ) 500 MG tablet Take 500 mg PO x q 12h x 3 days. ASAP w/in 24h of sx onset. 30 tablet 2   [DISCONTINUED] fluticasone  (FLONASE ) 50 MCG/ACT nasal spray Place 2 sprays into both nostrils daily. 16 g 0   No current facility-administered medications on file prior to visit.    Review of Systems  Constitutional:  Negative for chills and fever.  HENT:  Positive for nosebleeds. Negative for congestion.   Respiratory:  Negative for cough.   Cardiovascular:  Negative for chest pain and palpitations.  Gastrointestinal:  Negative for nausea and vomiting.  Psychiatric/Behavioral:  The patient is nervous/anxious.       Objective:    BP 122/78   Pulse 74   Temp 98.5 F (36.9 C) (Oral)   Ht 4' 11 (1.499 m)   Wt (!) 314 lb 9.6 oz (142.7 kg)   LMP  (LMP Unknown)   SpO2 98%   BMI 63.54 kg/m  BP Readings from Last 3 Encounters:  07/26/24 122/78  07/13/24 120/80  07/02/24 122/84   Wt Readings from Last 3 Encounters:  07/26/24 (!) 314 lb 9.6 oz (142.7 kg)  07/13/24 (!) 318 lb 12.8 oz (144.6 kg)  07/02/24 (!) 316 lb 6.4 oz (143.5 kg)      07/26/2024    4:18 PM 07/25/2024    9:43 AM 07/02/2024    1:21 PM   Depression screen PHQ 2/9  Decreased Interest 0 0 0  Down, Depressed, Hopeless 0 2 0  PHQ - 2 Score 0 2 0  Altered sleeping 0  0  Tired, decreased energy 0  0  Change in appetite 0  0  Feeling bad or failure about yourself  0  0  Trouble concentrating 0  0  Moving slowly or fidgety/restless 0  0  Suicidal thoughts 0  0  PHQ-9 Score 0  0   Difficult doing work/chores Not difficult at all  Not difficult at all     Data saved with a previous flowsheet row definition      07/26/2024    4:19 PM 05/25/2024   11:14 AM 07/28/2023    9:22 AM  GAD 7 : Generalized Anxiety Score  Nervous, Anxious, on Edge 0 0 0  Control/stop worrying 0 0 0  Worry too much - different things 0 0 0  Trouble relaxing 0 0 0  Restless 0 0 0  Easily annoyed or irritable 0 0 0  Afraid - awful might happen 0 0 0  Total GAD 7 Score 0 0 0  Anxiety Difficulty Not difficult at all Not difficult at all Not difficult at all     Physical Exam Vitals reviewed.  Constitutional:      Appearance: She is well-developed.  HENT:     Head: Normocephalic and atraumatic.     Right Ear: Tympanic membrane is not bulging.     Left Ear: Tympanic membrane is not bulging.     Nose: Nose normal. No rhinorrhea.     Right Nostril: No foreign body or epistaxis.     Left Nostril: No foreign body or epistaxis.     Right Sinus: No maxillary sinus tenderness or frontal sinus tenderness.     Left Sinus: No maxillary sinus tenderness or frontal sinus tenderness.     Comments: No epistaxis on exam.  No bleeding lesion appreciated with otoscope.    Mouth/Throat:     Pharynx: Uvula midline. No oropharyngeal  exudate or posterior oropharyngeal erythema.     Tonsils: No tonsillar abscesses.  Eyes:     Conjunctiva/sclera: Conjunctivae normal.  Cardiovascular:     Rate and Rhythm: Regular rhythm.     Pulses: Normal pulses.     Heart sounds: Normal heart sounds.  Pulmonary:     Effort: Pulmonary effort is normal.     Breath sounds:  Normal breath sounds. No wheezing, rhonchi or rales.  Lymphadenopathy:     Head:     Right side of head: No submental, submandibular, tonsillar, preauricular, posterior auricular or occipital adenopathy.     Left side of head: No submental, submandibular, tonsillar, preauricular, posterior auricular or occipital adenopathy.     Cervical: No cervical adenopathy.  Skin:    General: Skin is warm and dry.  Neurological:     Mental Status: She is alert.  Psychiatric:        Speech: Speech normal.        Behavior: Behavior normal.        Thought Content: Thought content normal.

## 2024-07-26 NOTE — Telephone Encounter (Signed)
 Per referral inbasket, schedule follow up to re-establish care with Dr.Yu.   I called and left vm for pt to call back to schedule appts. Scheduling phone number provided.

## 2024-07-27 NOTE — Telephone Encounter (Signed)
 Pt called back and MD appt followed by lab is scheduled and date/time confirmed with pt

## 2024-07-28 NOTE — Assessment & Plan Note (Signed)
 Controlled at time of exam.  Discussed Eliquis  use and risk of epistaxis. Discussed use of Vaseline, Afrin if needed.  Prescription has been sent. Reiterated the importance of follow-up with hematology.  She will call Dr. Layvonne office to schedule.  I have also placed a referral to ENT for previous history of epistaxis, sinus symptoms.

## 2024-07-28 NOTE — Assessment & Plan Note (Addendum)
 Uncontrolled.  Discussed increasing Zoloft  to 150 mg.  Continue trazodone  50 mg at night.   Close follow-up.

## 2024-07-28 NOTE — Assessment & Plan Note (Signed)
 Reviewed CGM with data with patient today.  Blood sugars are improving.  She has been taking 2 units of Semglee  on accident due to number of clicks.  I doubt this is been clinically significant and have advised her to stop Semglee  completely.  Discussed the protein, carbohydrate every couple hours to sustain blood sugar and prevent hypoglycemia.  Advised to contact me if fasting blood sugars > 200 and we can discuss if insulin  needed.  Plan to increase mounjaro  to 7.5mg  in  4 weeks

## 2024-08-01 ENCOUNTER — Ambulatory Visit: Admitting: Family

## 2024-08-01 ENCOUNTER — Telehealth: Payer: Self-pay | Admitting: Family

## 2024-08-01 ENCOUNTER — Encounter: Payer: Self-pay | Admitting: Family

## 2024-08-01 VITALS — BP 122/78 | HR 89 | Temp 98.1°F | Ht 61.0 in | Wt 311.2 lb

## 2024-08-01 DIAGNOSIS — N3281 Overactive bladder: Secondary | ICD-10-CM | POA: Insufficient documentation

## 2024-08-01 DIAGNOSIS — Z0000 Encounter for general adult medical examination without abnormal findings: Secondary | ICD-10-CM

## 2024-08-01 DIAGNOSIS — E1165 Type 2 diabetes mellitus with hyperglycemia: Secondary | ICD-10-CM | POA: Diagnosis not present

## 2024-08-01 DIAGNOSIS — K529 Noninfective gastroenteritis and colitis, unspecified: Secondary | ICD-10-CM

## 2024-08-01 DIAGNOSIS — Z23 Encounter for immunization: Secondary | ICD-10-CM | POA: Diagnosis not present

## 2024-08-01 DIAGNOSIS — E559 Vitamin D deficiency, unspecified: Secondary | ICD-10-CM | POA: Diagnosis not present

## 2024-08-01 DIAGNOSIS — F419 Anxiety disorder, unspecified: Secondary | ICD-10-CM

## 2024-08-01 DIAGNOSIS — Z13220 Encounter for screening for lipoid disorders: Secondary | ICD-10-CM | POA: Diagnosis not present

## 2024-08-01 DIAGNOSIS — R040 Epistaxis: Secondary | ICD-10-CM

## 2024-08-01 DIAGNOSIS — Z136 Encounter for screening for cardiovascular disorders: Secondary | ICD-10-CM

## 2024-08-01 DIAGNOSIS — F32A Depression, unspecified: Secondary | ICD-10-CM

## 2024-08-01 DIAGNOSIS — Z0001 Encounter for general adult medical examination with abnormal findings: Secondary | ICD-10-CM | POA: Diagnosis not present

## 2024-08-01 DIAGNOSIS — R350 Frequency of micturition: Secondary | ICD-10-CM | POA: Diagnosis not present

## 2024-08-01 DIAGNOSIS — M7989 Other specified soft tissue disorders: Secondary | ICD-10-CM | POA: Diagnosis not present

## 2024-08-01 LAB — URINALYSIS, ROUTINE W REFLEX MICROSCOPIC
Bilirubin Urine: NEGATIVE
Ketones, ur: NEGATIVE
Nitrite: NEGATIVE
Specific Gravity, Urine: 1.015 (ref 1.000–1.030)
Total Protein, Urine: NEGATIVE
Urine Glucose: NEGATIVE
Urobilinogen, UA: 0.2 (ref 0.0–1.0)
pH: 6 (ref 5.0–8.0)

## 2024-08-01 LAB — COMPREHENSIVE METABOLIC PANEL WITH GFR
ALT: 23 U/L (ref 0–35)
AST: 24 U/L (ref 0–37)
Albumin: 4.2 g/dL (ref 3.5–5.2)
Alkaline Phosphatase: 88 U/L (ref 39–117)
BUN: 11 mg/dL (ref 6–23)
CO2: 27 meq/L (ref 19–32)
Calcium: 9.3 mg/dL (ref 8.4–10.5)
Chloride: 101 meq/L (ref 96–112)
Creatinine, Ser: 0.76 mg/dL (ref 0.40–1.20)
GFR: 91.22 mL/min (ref >60.00)
Glucose, Bld: 94 mg/dL (ref 70–99)
Potassium: 3.9 meq/L (ref 3.5–5.1)
Sodium: 139 meq/L (ref 135–145)
Total Bilirubin: 0.5 mg/dL (ref 0.2–1.2)
Total Protein: 7.9 g/dL (ref 6.0–8.3)

## 2024-08-01 LAB — LIPID PANEL
Cholesterol: 208 mg/dL — ABNORMAL HIGH (ref 0–200)
HDL: 41.7 mg/dL (ref >39.00)
LDL Cholesterol: 134 mg/dL — ABNORMAL HIGH (ref 0–99)
NonHDL: 166.26
Total CHOL/HDL Ratio: 5
Triglycerides: 161 mg/dL — ABNORMAL HIGH (ref 0.0–149.0)
VLDL: 32.2 mg/dL (ref 0.0–40.0)

## 2024-08-01 LAB — MICROALBUMIN / CREATININE URINE RATIO
Creatinine,U: 155.4 mg/dL
Microalb Creat Ratio: UNDETERMINED mg/g (ref 0.0–30.0)
Microalb, Ur: <0.7 mg/dL

## 2024-08-01 LAB — CBC WITH DIFFERENTIAL/PLATELET
Basophils Absolute: 0.1 K/uL (ref 0.0–0.1)
Basophils Relative: 0.9 % (ref 0.0–3.0)
Eosinophils Absolute: 0.3 K/uL (ref 0.0–0.7)
Eosinophils Relative: 4.2 % (ref 0.0–5.0)
HCT: 39.8 % (ref 36.0–46.0)
Hemoglobin: 13.5 g/dL (ref 12.0–15.0)
Lymphocytes Relative: 27.5 % (ref 12.0–46.0)
Lymphs Abs: 2 K/uL (ref 0.7–4.0)
MCHC: 33.8 g/dL (ref 30.0–36.0)
MCV: 83.4 fl (ref 78.0–100.0)
Monocytes Absolute: 0.4 K/uL (ref 0.1–1.0)
Monocytes Relative: 5.2 % (ref 3.0–12.0)
Neutro Abs: 4.6 K/uL (ref 1.4–7.7)
Neutrophils Relative %: 62.2 % (ref 43.0–77.0)
Platelets: 393 K/uL (ref 150.0–400.0)
RBC: 4.77 Mil/uL (ref 3.87–5.11)
RDW: 13.6 % (ref 11.5–15.5)
WBC: 7.4 K/uL (ref 4.0–10.5)

## 2024-08-01 LAB — VITAMIN D 25 HYDROXY (VIT D DEFICIENCY, FRACTURES): VITD: 64.06 ng/mL (ref 30.00–100.00)

## 2024-08-01 LAB — TSH: TSH: 1.16 u[IU]/mL (ref 0.35–5.50)

## 2024-08-01 NOTE — Telephone Encounter (Signed)
 [facility] is closed today will reopen on Mon 08/06/24

## 2024-08-01 NOTE — Telephone Encounter (Signed)
 Call [facility]  Need ov notes from this week

## 2024-08-01 NOTE — Progress Notes (Signed)
 Assessment & Plan:  Routine physical examination Assessment & Plan: Discussed cervical cancer screening guidelines.  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  Patient politely declines repeating cervical cancer screening in 3 years prefers to wait to 5 years which is appropriate due to negative cotesting.  Will plan to repeat Pap smear in 2027.  She will schedule mammogram.  Influenza vaccine given.  Orders: -     TSH -     Comprehensive metabolic panel with GFR -     Lipid panel -     VITAMIN D  25 Hydroxy (Vit-D Deficiency, Fractures) -     CBC with Differential/Platelet -     3D Screening Mammogram, Left and Right; Future -     Urinalysis, Routine w reflex microscopic -     Urine Culture  Vitamin D  deficiency  Encounter for lipid screening for cardiovascular disease  Uncontrolled type 2 diabetes mellitus with hyperglycemia, without long-term current use of insulin  (HCC) Assessment & Plan: Chronic, excellent control.  No longer on semglee   Average glucose 104 GMI 5.8 Continue Mounjaro  5 mg weekly for glycemic control, weight management  Orders: -     Microalbumin / creatinine urine ratio  OAB (overactive bladder)  Need for influenza vaccination -     Flu vaccine trivalent PF, 6mos and older(Flulaval,Afluria,Fluarix,Fluzone)  Anxiety and depression Assessment & Plan: Chronic, improved.  Continue Zoloft  150 mg and trazodone  50mg  qhs   Leg swelling Assessment & Plan: Presentation not consistent with infection.  Consistent with venous insufficiency.  Discussed importance of compression stockings.  Will follow   Urine frequency Assessment & Plan: Symptoms consistent with overactive bladder.  Pending urine studies.  If negative for infection, we discussed trial of trospium.  Counseled on anticholinergic effects   Epistaxis Assessment & Plan: Resolved after cauterization from ENT.  Will follow   Chronic diarrhea Assessment & Plan: Episodic fecal incontinence.   Discussed trial stool bulking agent  Metamucil.  If symptoms do not resolve, advised she would need to follow-up with Dr. Unk.      Return precautions given.   Risks, benefits, and alternatives of the medications and treatment plan prescribed today were discussed, and patient expressed understanding.   Education regarding symptom management and diagnosis given to patient on AVS either electronically or printed.  Return in about 1 month (around 08/31/2024).  Rollene Northern, FNP  Subjective:    Patient ID: Clancy Hummer, female    DOB: [birth year], 48 y.o.   MRN: [id number]  CC: Samayra Hebel is a 48 y.o. female who presents today for physical exam.    HPI: HPI Discussed the use of AI scribe software for clinical note transcription with the patient, who gave verbal consent to proceed.  History of Present Illness   Kimbrely Buckel is a 48 year old female who presents for a follow-up visit and annual physical exam.  She has experienced a slight improvement in her mood since the increase in her Zoloft  dosage, feeling 'just a little bit' better. She is making efforts to be more present with her family but feels anxious about an upcoming family gathering due to unresolved issues with her sister.  She recently underwent cauterization of a blood vessel in her nose due to recurrent epistaxis by ENT this week.  Epistaxis has resolved.  She has not used Afrin.  She has a history of prediabetes and is currently on Mounjaro , which is working well. She has lost three pounds and experiences reduced cravings, particularly for sweets.  She reports urinary urgency and occasional incontinence, particularly when unable to reach the bathroom in time, and stress incontinence with laughing or sneezing.  Endorses episodes of fecal incontinence.  She has a history of IBS  She experiences leg tenderness and swelling, particularly later in the day. Her legs feel restless at night, making it difficult to get  comfortable. The swelling decreases overnight but returns during the day, especially with prolonged standing or sitting.  She is not wearing compression stockings     Increased Zoloft  150 mg last week which has been helpful for her.  No longer on semglee    Average glucose 104 GMI 5.8  Colorectal Cancer Screening: UTD , 01/2022; repeat in 10 years  Breast Cancer Screening: Mammogram UTD; due 08/2024 Cervical Cancer Screening: UTD, 05/25/2021, negative HPV, malignancy.   Bone Health screening/DEXA for 65+: No increased fracture risk. Defer screening at this time.  Lung Cancer Screening: Doesn't have 20 year pack year history and age > 8 years yo 71 years         Tetanus - UTD        Pneumococcal - Candidate for. Declines PCV20.  Exercise: Gets regular exercise.   Alcohol use:  none Smoking/tobacco use: Nonsmoker.    Health Maintenance  Topic Date Due   Eye exam for diabetics  Never done   COVID-19 Vaccine (3 - Pfizer risk series) 01/07/2020   Zoster (Shingles) Vaccine (1 of 2) 08/14/2024*   Pneumococcal Vaccine for age over 15 (1 of 2 - PCV) 05/15/2025*   Hemoglobin A1C  11/18/2024   Complete foot exam   07/02/2025   Yearly kidney function blood test for diabetes  08/01/2025   Yearly kidney health urinalysis for diabetes  08/01/2025   Breast Cancer Screening  08/23/2025   Pap with HPV screening  05/25/2026   Colon Cancer Screening  01/07/2027   DTaP/Tdap/Td vaccine (4 - Td or Tdap) 05/26/2031   Flu Shot  Completed   Hepatitis B Vaccine  Completed   Hepatitis C Screening  Completed   HIV Screening  Completed   HPV Vaccine  Aged Out   Meningitis B Vaccine  Aged Out  *Topic was postponed. The date shown is not the original due date.    ALLERGIES: Penicillins, Other, Fruit & vegetable daily [nutritional supplements], Sulfa antibiotics, and Rocephin  [ceftriaxone ]  Current Outpatient Medications on File Prior to Visit  Medication Sig Dispense Refill   albuterol  (VENTOLIN   HFA) 108 (90 Base) MCG/ACT inhaler Inhale 2 puffs into the lungs every 6 (six) hours as needed for wheezing or shortness of breath. 6.7 g 0   apixaban  (ELIQUIS ) 5 MG TABS tablet Take 1 tablet (5 mg total) by mouth 2 (two) times daily. 180 tablet 3   Cholecalciferol  (VITAMIN D3 PO) Take 4,000 Int'l Units by mouth.     clobetasol  ointment (TEMOVATE ) 0.05 % Apply to affected skin qd-bid prn, Avoid applying to face, groin, and axilla. Use as directed. Long-term use can cause thinning of the skin. 30 g 2   clotrimazole  (LOTRIMIN ) 1 % cream Apply 1 Application topically 2 (two) times daily. 30 g 2   Clotrimazole  1 % OINT Apply 1 application topically 2 (two) times daily. 56.7 g 1   Continuous Glucose Sensor (FREESTYLE LIBRE 3 PLUS SENSOR) MISC Change sensor every 15 days. 4 each 2   Dupilumab  (DUPIXENT ) 300 MG/2ML SOAJ INJECT 300 MG UNDER THE SKIN EVERY 14 DAYS 4 mL 11   gabapentin  (NEURONTIN ) 300 MG capsule TAKE 1 CAPSULE  BY MOUTH THREE TIMES A DAY 90 capsule 1   hydrOXYzine  (ATARAX ) 25 MG tablet Take 25 mg by mouth 3 (three) times daily.     Insulin  Pen Needle (EMBECTA PEN NEEDLE NANO) 32G X 4 MM MISC 1 Dose by Does not apply route daily. 30 each 0   levocetirizine (XYZAL) 5 MG tablet Take 5 mg by mouth every evening.     meloxicam  (MOBIC ) 15 MG tablet TAKE 1 TABLET (15 MG TOTAL) BY MOUTH DAILY AS NEEDED FOR PAIN WITH FOOD 30 tablet 1   mometasone  (ELOCON ) 0.1 % cream Apply 1 application. topically daily as needed (Rash). Qd up to 5 days a week to eczema on hands and left foot prn flares 45 g 3   mupirocin  ointment (BACTROBAN ) 2 % Apply 1 application topically 2 (two) times daily. To lesions on abdomen. 22 g 0   omeprazole  (PRILOSEC) 20 MG capsule TAKE 1 CAPSULE (20 MG TOTAL) BY MOUTH DAILY. TAKE 30 MINUTES TO ONE HOUR BEFORE BREAKFAST 90 capsule 0   polyethylene glycol (MIRALAX  / GLYCOLAX ) 17 g packet Take 17 g by mouth daily. 30 each 0   Ruxolitinib Phosphate  (OPZELURA ) 1.5 % CREA Apply to hands once  a day 60 g 2   saccharomyces boulardii (FLORASTOR) 250 MG capsule Take 1 capsule (250 mg total) by mouth 2 (two) times daily. 60 capsule 0   sertraline  (ZOLOFT ) 50 MG tablet Take 3 tablets (150 mg total) by mouth at bedtime. 270 tablet 3   tirzepatide  (MOUNJARO ) 5 MG/0.5ML Pen Inject 5 mg into the skin once a week. 6 mL 2   traZODone  (DESYREL ) 50 MG tablet TAKE 0.5-1 TABLETS BY MOUTH AT BEDTIME AS NEEDED FOR SLEEP. 90 tablet 3   triamcinolone  (KENALOG ) 0.025 % cream Apply 1 application topically 2 (two) times daily. 80 g 2   triamcinolone  ointment (KENALOG ) 0.5 % Apply 1 Application topically 2 (two) times daily. Use sparingly for < 1 week. Do not use internal genitalia 15 g 2   valACYclovir  (VALTREX ) 500 MG tablet Take 500 mg PO x q 12h x 3 days. ASAP w/in 24h of sx onset. 30 tablet 2   [DISCONTINUED] fluticasone  (FLONASE ) 50 MCG/ACT nasal spray Place 2 sprays into both nostrils daily. 16 g 0   No current facility-administered medications on file prior to visit.    Review of Systems  Constitutional:  Negative for chills and fever.  HENT:  Negative for nosebleeds.   Respiratory:  Negative for cough and shortness of breath.   Cardiovascular:  Positive for leg swelling. Negative for chest pain and palpitations.  Gastrointestinal:  Negative for nausea and vomiting.  Psychiatric/Behavioral:  Negative for sleep disturbance and suicidal ideas.       Objective:    BP 122/78   Pulse 89   Temp 98.1 F (36.7 C) (Oral)   Ht 5' 1 (1.549 m)   Wt (!) 311 lb 3.2 oz (141.2 kg)   LMP  (LMP Unknown)   SpO2 95%   BMI 58.80 kg/m   BP Readings from Last 3 Encounters:  08/01/24 122/78  07/26/24 122/78  07/13/24 120/80   Wt Readings from Last 3 Encounters:  08/01/24 (!) 311 lb 3.2 oz (141.2 kg)  07/26/24 (!) 314 lb 9.6 oz (142.7 kg)  07/13/24 (!) 318 lb 12.8 oz (144.6 kg)      08/01/2024    8:51 AM 07/26/2024    4:18 PM 07/25/2024    9:43 AM  Depression screen PHQ 2/9  Decreased  Interest  0 0 0  Down, Depressed, Hopeless 0 0 2  PHQ - 2 Score 0 0 2  Altered sleeping 0 0   Tired, decreased energy 0 0   Change in appetite 0 0   Feeling bad or failure about yourself  0 0   Trouble concentrating 0 0   Moving slowly or fidgety/restless 0 0   Suicidal thoughts 0 0   PHQ-9 Score 0 0   Difficult doing work/chores Not difficult at all Not difficult at all       08/01/2024    8:51 AM 07/26/2024    4:19 PM 05/25/2024   11:14 AM 07/28/2023    9:22 AM  GAD 7 : Generalized Anxiety Score  Nervous, Anxious, on Edge 0 0 0 0  Control/stop worrying 0 0 0 0  Worry too much - different things 0 0 0 0  Trouble relaxing 0 0 0 0  Restless 0 0 0 0  Easily annoyed or irritable 0 0 0 0  Afraid - awful might happen 0 0 0 0  Total GAD 7 Score 0 0 0 0  Anxiety Difficulty Not difficult at all Not difficult at all Not difficult at all Not difficult at all      Physical Exam Vitals reviewed.  Constitutional:      Appearance: She is well-developed.  Eyes:     Conjunctiva/sclera: Conjunctivae normal.  Cardiovascular:     Rate and Rhythm: Normal rate and regular rhythm.     Pulses: Normal pulses.     Heart sounds: Normal heart sounds.     Comments: +1 non slightly pitting edema BLE.  No palpable cords or masses. No erythema or increased warmth. Hyperpigmentation noted bilaterally LE warm and palpable pedal pulses.  Pulmonary:     Effort: Pulmonary effort is normal.     Breath sounds: Normal breath sounds. No wheezing, rhonchi or rales.  Musculoskeletal:     Right lower leg: Edema present.     Left lower leg: Edema present.  Skin:    General: Skin is warm and dry.  Neurological:     Mental Status: She is alert.  Psychiatric:        Speech: Speech normal.        Behavior: Behavior normal.        Thought Content: Thought content normal.

## 2024-08-01 NOTE — Patient Instructions (Addendum)
 Please call  and schedule your 3D mammogram and /or bone density scan as we discussed.   [facility]  ( new location in 2023)  [address], [place], [place] [address]  [place], [place]  [phone]   Trial of metamucil to bulk stool to lessen fecal incontinence. Consider consult with Dr Unk  After urine studies, consider trial of trospium for overactive bladder.  Compression stockings daily for lower extremities     Health Maintenance for Postmenopausal Women Menopause is a normal process in which your ability to get pregnant comes to an end. This process happens slowly over many months or years, usually between the ages of 73 and 49. Menopause is complete when you have missed your menstrual period for 12 months. It is important to talk with your health care provider about some of the most common conditions that affect women after menopause (postmenopausal women). These include heart disease, cancer, and bone loss (osteoporosis). Adopting a healthy lifestyle and getting preventive care can help to promote your health and wellness. The actions you take can also lower your chances of developing some of these common conditions. What are the signs and symptoms of menopause? During menopause, you may have the following symptoms: Hot flashes. These can be moderate or severe. Night sweats. Decrease in sex drive. Mood swings. Headaches. Tiredness (fatigue). Irritability. Memory problems. Problems falling asleep or staying asleep. Talk with your health care provider about treatment options for your symptoms. Do I need hormone replacement therapy? Hormone replacement therapy is effective in treating symptoms that are caused by menopause, such as hot flashes and night sweats. Hormone replacement carries certain risks, especially as you become older. If you are thinking about using estrogen or estrogen with progestin, discuss the benefits and risks with your health  care provider. How can I reduce my risk for heart disease and stroke? The risk of heart disease, heart attack, and stroke increases as you age. One of the causes may be a change in the body's hormones during menopause. This can affect how your body uses dietary fats, triglycerides, and cholesterol. Heart attack and stroke are medical emergencies. There are many things that you can do to help prevent heart disease and stroke. Watch your blood pressure High blood pressure causes heart disease and increases the risk of stroke. This is more likely to develop in people who have high blood pressure readings or are overweight. Have your blood pressure checked: Every 3-5 years if you are 98-62 years of age. Every year if you are 53 years old or older. Eat a healthy diet  Eat a diet that includes plenty of vegetables, fruits, low-fat dairy products, and lean protein. Do not eat a lot of foods that are high in solid fats, added sugars, or sodium. Get regular exercise Get regular exercise. This is one of the most important things you can do for your health. Most adults should: Try to exercise for at least 150 minutes each week. The exercise should increase your heart rate and make you sweat (moderate-intensity exercise). Try to do strengthening exercises at least twice each week. Do these in addition to the moderate-intensity exercise. Spend less time sitting. Even light physical activity can be beneficial. Other tips Work with your health care provider to achieve or maintain a healthy weight. Do not use any products that contain nicotine or tobacco. These products include cigarettes, chewing tobacco, and vaping devices, such as e-cigarettes. If you need help quitting, ask your health care provider.  Know your numbers. Ask your health care provider to check your cholesterol and your blood sugar (glucose). Continue to have your blood tested as directed by your health care provider. Do I need screening for  cancer? Depending on your health history and family history, you may need to have cancer screenings at different stages of your life. This may include screening for: Breast cancer. Cervical cancer. Lung cancer. Colorectal cancer. What is my risk for osteoporosis? After menopause, you may be at increased risk for osteoporosis. Osteoporosis is a condition in which bone destruction happens more quickly than new bone creation. To help prevent osteoporosis or the bone fractures that can happen because of osteoporosis, you may take the following actions: If you are 78-27 years old, get at least 1,000 mg of calcium and at least 600 international units (IU) of vitamin D  per day. If you are older than age 41 but younger than age 34, get at least 1,200 mg of calcium and at least 600 international units (IU) of vitamin D  per day. If you are older than age 12, get at least 1,200 mg of calcium and at least 800 international units (IU) of vitamin D  per day. Smoking and drinking excessive alcohol increase the risk of osteoporosis. Eat foods that are rich in calcium and vitamin D , and do weight-bearing exercises several times each week as directed by your health care provider. How does menopause affect my mental health? Depression may occur at any age, but it is more common as you become older. Common symptoms of depression include: Feeling depressed. Changes in sleep patterns. Changes in appetite or eating patterns. Feeling an overall lack of motivation or enjoyment of activities that you previously enjoyed. Frequent crying spells. Talk with your health care provider if you think that you are experiencing any of these symptoms. General instructions See your health care provider for regular wellness exams and vaccines. This may include: Scheduling regular health, dental, and eye exams. Getting and maintaining your vaccines. These include: Influenza vaccine. Get this vaccine each year before the flu season  begins. Pneumonia vaccine. Shingles vaccine. Tetanus, diphtheria, and pertussis (Tdap) booster vaccine. Your health care provider may also recommend other immunizations. Tell your health care provider if you have ever been abused or do not feel safe at home. Summary Menopause is a normal process in which your ability to get pregnant comes to an end. This condition causes hot flashes, night sweats, decreased interest in sex, mood swings, headaches, or lack of sleep. Treatment for this condition may include hormone replacement therapy. Take actions to keep yourself healthy, including exercising regularly, eating a healthy diet, watching your weight, and checking your blood pressure and blood sugar levels. Get screened for cancer and depression. Make sure that you are up to date with all your vaccines. This information is not intended to replace advice given to you by your health care provider. Make sure you discuss any questions you have with your health care provider. Document Revised: 01/12/2021 Document Reviewed: 01/12/2021 Elsevier Patient Education  2024 [employer].

## 2024-08-02 LAB — URINE CULTURE
MICRO NUMBER:: 17287231
Result:: NO GROWTH
SPECIMEN QUALITY:: ADEQUATE

## 2024-08-06 NOTE — Telephone Encounter (Signed)
 Spoke to receptionist @ [facility] they will fax over ov notes fax # was given

## 2024-08-07 ENCOUNTER — Ambulatory Visit: Payer: Self-pay | Admitting: Family

## 2024-08-08 NOTE — Assessment & Plan Note (Signed)
 Chronic, excellent control.  No longer on semglee   Average glucose 104 GMI 5.8 Continue Mounjaro  5 mg weekly for glycemic control, weight management

## 2024-08-08 NOTE — Assessment & Plan Note (Addendum)
 Resolved after cauterization from ENT.  Will follow

## 2024-08-08 NOTE — Assessment & Plan Note (Addendum)
 Chronic, improved.  Continue Zoloft  150 mg and trazodone  50mg  qhs

## 2024-08-08 NOTE — Assessment & Plan Note (Signed)
 Discussed cervical cancer screening guidelines.  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  Patient politely declines repeating cervical cancer screening in 3 years prefers to wait to 5 years which is appropriate due to negative cotesting.  Will plan to repeat Pap smear in 2027.  She will schedule mammogram.  Influenza vaccine given.

## 2024-08-08 NOTE — Assessment & Plan Note (Signed)
 Presentation not consistent with infection.  Consistent with venous insufficiency.  Discussed importance of compression stockings.  Will follow

## 2024-08-08 NOTE — Assessment & Plan Note (Signed)
 Episodic fecal incontinence.  Discussed trial stool bulking agent  Metamucil.  If symptoms do not resolve, advised she would need to follow-up with Dr. Unk.

## 2024-08-08 NOTE — Assessment & Plan Note (Signed)
 Symptoms consistent with overactive bladder.  Pending urine studies.  If negative for infection, we discussed trial of trospium.  Counseled on anticholinergic effects

## 2024-08-09 ENCOUNTER — Other Ambulatory Visit: Payer: Self-pay | Admitting: Family

## 2024-08-09 DIAGNOSIS — E1165 Type 2 diabetes mellitus with hyperglycemia: Secondary | ICD-10-CM

## 2024-08-09 MED ORDER — ROSUVASTATIN CALCIUM 5 MG PO TABS: 5.0000 mg | ORAL_TABLET | Freq: Every day | ORAL | 3 refills | Status: AC

## 2024-08-21 ENCOUNTER — Other Ambulatory Visit: Payer: Self-pay | Admitting: Family

## 2024-08-21 DIAGNOSIS — K219 Gastro-esophageal reflux disease without esophagitis: Secondary | ICD-10-CM

## 2024-08-22 ENCOUNTER — Other Ambulatory Visit: Payer: Self-pay | Admitting: Family

## 2024-08-22 DIAGNOSIS — G8929 Other chronic pain: Secondary | ICD-10-CM

## 2024-08-22 DIAGNOSIS — M2550 Pain in unspecified joint: Secondary | ICD-10-CM

## 2024-09-03 ENCOUNTER — Telehealth: Payer: Self-pay | Admitting: Oncology

## 2024-09-03 ENCOUNTER — Ambulatory Visit: Admitting: Family

## 2024-09-03 NOTE — Telephone Encounter (Signed)
 Patient called to reschedule her appointments due to having to take care of her husband who had surgery. She requested a later date in January. There are no est patient appointments for Jan.   appointments for 12/31 cancelled per patient request    Please advise Thank you

## 2024-09-05 ENCOUNTER — Inpatient Hospital Stay: Admitting: Oncology

## 2024-09-05 ENCOUNTER — Inpatient Hospital Stay

## 2024-09-12 ENCOUNTER — Encounter: Admitting: Dietician

## 2024-09-26 ENCOUNTER — Encounter: Payer: Self-pay | Admitting: Family

## 2024-09-26 ENCOUNTER — Encounter: Admitting: Dietician

## 2024-09-26 ENCOUNTER — Ambulatory Visit: Admitting: Family

## 2024-09-26 VITALS — BP 122/68 | HR 73 | Temp 98.3°F | Ht 61.0 in | Wt 306.6 lb

## 2024-09-26 DIAGNOSIS — Z7985 Long-term (current) use of injectable non-insulin antidiabetic drugs: Secondary | ICD-10-CM

## 2024-09-26 DIAGNOSIS — E1165 Type 2 diabetes mellitus with hyperglycemia: Secondary | ICD-10-CM

## 2024-09-26 DIAGNOSIS — R5383 Other fatigue: Secondary | ICD-10-CM

## 2024-09-26 DIAGNOSIS — R7309 Other abnormal glucose: Secondary | ICD-10-CM

## 2024-09-26 DIAGNOSIS — I2699 Other pulmonary embolism without acute cor pulmonale: Secondary | ICD-10-CM

## 2024-09-26 DIAGNOSIS — B372 Candidiasis of skin and nail: Secondary | ICD-10-CM | POA: Diagnosis not present

## 2024-09-26 DIAGNOSIS — H9313 Tinnitus, bilateral: Secondary | ICD-10-CM

## 2024-09-26 LAB — HEPATIC FUNCTION PANEL
ALT: 19 U/L (ref 3–35)
AST: 21 U/L (ref 5–37)
Albumin: 4.3 g/dL (ref 3.5–5.2)
Alkaline Phosphatase: 84 U/L (ref 39–117)
Bilirubin, Direct: 0.1 mg/dL (ref 0.1–0.3)
Total Bilirubin: 0.4 mg/dL (ref 0.2–1.2)
Total Protein: 7.5 g/dL (ref 6.0–8.3)

## 2024-09-26 LAB — LIPID PANEL
Cholesterol: 159 mg/dL (ref 28–200)
HDL: 43.5 mg/dL
LDL Cholesterol: 82 mg/dL (ref 10–99)
NonHDL: 115.54
Total CHOL/HDL Ratio: 4
Triglycerides: 170 mg/dL — ABNORMAL HIGH (ref 10.0–149.0)
VLDL: 34 mg/dL (ref 0.0–40.0)

## 2024-09-26 LAB — POCT GLYCOSYLATED HEMOGLOBIN (HGB A1C): HbA1c POC (<> result, manual entry): 5.9 %

## 2024-09-26 NOTE — Progress Notes (Signed)
 "  Assessment & Plan:  Uncontrolled type 2 diabetes mellitus with hyperglycemia, without long-term current use of insulin  (HCC) Assessment & Plan: Chronic, excellent glycemic control.  She has done well on Mounjaro  5 mg and interested in increasing to 7.5 for additional benefit of weight loss.  Start Mounjaro  7.5 mg  Orders: -     Lipid panel -     Hepatic function panel -     Tirzepatide ; Inject 7.5 mg into the skin once a week.  Dispense: 6 mL; Refill: 2  Elevated glucose -     POCT glycosylated hemoglobin (Hb A1C)  Candidal intertrigo Assessment & Plan: Presentation consistent with Candida.  Topical clotrimazole  has not been effective.  Start Diflucan  150 mg once weekly x 4  Orders: -     Fluconazole ; Take 1 tablet (150 mg total) by mouth once a week for 4 doses.  Dispense: 4 tablet; Refill: 0  Tinnitus of both ears Assessment & Plan: Chronic, nonpulsatile, nonpainful tinnitus.  Discussed episode when walking her dog when she lost her balance and subsequently hit her chin.  She is on Eliquis  5 mg twice daily.  Discussed further evaluation including neuroimaging.  She politely declines at this time and does not feel further evaluation is necessary.  No recurrence of imbalance.  Discussed return to ENT, vestibular rehab.  She politely declines. 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  Continue Eliquis  5 mg twice daily.  She is scheduled to establish care with Dr. Babara, hematology next month.   Other fatigue Assessment & Plan: Chronic, poorly controlled.  Discussed symptoms of sleep apnea and risk of cardiac arrhythmia, stroke and heart attack if not treated.  She has home sleep study at home as provided by pulmonology.  Strongly encouraged her to complete this test.  Continue Mounjaro .      Return precautions given.   Risks, benefits, and alternatives of the medications and treatment  plan prescribed today were discussed, and patient expressed understanding.   Education regarding symptom management and diagnosis given to patient on AVS either electronically or printed.  Return in about 3 months (around 12/25/2024).  Rollene Northern, FNP  Subjective:    Patient ID: Danielle Browning, female    DOB: 1976/02/12, 49 y.o.   MRN: [id number]  CC: Danielle Browning is a 49 y.o. female who presents today for follow up.   HPI: She had a fall 2 weeks ago  while walking her dog. She feels she 'lost her balance'  She fell and hit her chin. No loc, laceration, bleeding.  She questions if r/t vertigo. She feels a sense of room spinning.  She has seen [facility] years ago and she has done vestibular rehab in the past. Symptom has completely resolved at this time. She wanted to mention today.   She is on Eliquis  5 mg twice daily indefinitely due to history of PE and DVT.  No fever, vision loss, diplopia, HA, ear pain   Chronic episodic non pulsatile BL tinnitus   No dizziness with going from sitting to standing.    Complains of itchy rash under left breast, some improvement. She has used clotrimazole  without relief'. Denies fever     She has done well on mounjaro  5mg . Weight loss has plateuaed. She may have a day of mild nausea which is not bothersome.   She has home sleep test, yet to do at home.  Complains of ongoing fatigue  She is compliant with zoloft  150mg  every day. She is compliant with trazodone  50mg  prn.    She continues to have chronic low back pain.  Previously seen Dr. Dodson s/p [facility] 10/14/23.   Allergies: Penicillins, Other, Fruit & vegetable daily [nutritional supplements], Sulfa antibiotics, and Rocephin  [ceftriaxone ] Medications Ordered Prior to Encounter[1]  Review of Systems  Constitutional:  Positive for fatigue. Negative for chills and fever.  HENT:  Positive for tinnitus.   Eyes:  Negative for visual disturbance.  Respiratory:  Negative for cough.    Cardiovascular:  Negative for chest pain and palpitations.  Gastrointestinal:  Negative for nausea and vomiting.  Skin:  Positive for rash.  Neurological:  Positive for dizziness. Negative for syncope and headaches.      Objective:    BP 122/68   Pulse 73   Temp 98.3 F (36.8 C) (Oral)   Ht 5' 1 (1.549 m)   Wt (!) 306 lb 9.6 oz (139.1 kg)   SpO2 96%   BMI 57.93 kg/m  BP Readings from Last 3 Encounters:  09/26/24 122/68  08/01/24 122/78  07/26/24 122/78   Wt Readings from Last 3 Encounters:  09/26/24 (!) 306 lb 9.6 oz (139.1 kg)  08/01/24 (!) 311 lb 3.2 oz (141.2 kg)  07/26/24 (!) 314 lb 9.6 oz (142.7 kg)    Physical Exam Vitals reviewed.  Constitutional:      Appearance: She is well-developed.  Eyes:     Conjunctiva/sclera: Conjunctivae normal.  Cardiovascular:     Rate and Rhythm: Normal rate and regular rhythm.     Pulses: Normal pulses.     Heart sounds: Normal heart sounds.  Pulmonary:     Effort: Pulmonary effort is normal.     Breath sounds: Normal breath sounds. No wheezing, rhonchi or rales.  Skin:    General: Skin is warm and dry.         Comments: Well demarcated erythematous line under left breast.  No purulent discharge.  No active.  Neurological:     Mental Status: She is alert.  Psychiatric:        Speech: Speech normal.        Behavior: Behavior normal.        Thought Content: Thought content normal.            [1]  Current Outpatient Medications on File Prior to Visit  Medication Sig Dispense Refill   albuterol  (VENTOLIN  HFA) 108 (90 Base) MCG/ACT inhaler Inhale 2 puffs into the lungs every 6 (six) hours as needed for wheezing or shortness of breath. 6.7 g 0   apixaban  (ELIQUIS ) 5 MG TABS tablet Take 1 tablet (5 mg total) by mouth 2 (two) times daily. 180 tablet 3   Cholecalciferol  (VITAMIN D3 PO) Take 4,000 Int'l Units by mouth.     clobetasol  ointment (TEMOVATE ) 0.05 % Apply to affected skin qd-bid prn, Avoid applying to face,  groin, and axilla. Use as directed. Long-term use can cause thinning of the skin. 30 g 2   clotrimazole  (LOTRIMIN ) 1 % cream Apply 1 Application topically 2 (two) times daily. 30 g 2   Clotrimazole  1 % OINT Apply 1 application topically 2 (two) times daily. 56.7 g 1   Continuous Glucose Sensor (FREESTYLE LIBRE 3 PLUS SENSOR) MISC Change sensor every 15 days. 4 each 2   Dupilumab  (DUPIXENT ) 300 MG/2ML SOAJ INJECT 300 MG UNDER THE SKIN EVERY 14 DAYS 4 mL 11   gabapentin  (NEURONTIN ) 300 MG capsule TAKE 1 CAPSULE BY  MOUTH THREE TIMES A DAY 270 capsule 3   hydrOXYzine  (ATARAX ) 25 MG tablet Take 25 mg by mouth 3 (three) times daily.     Insulin  Pen Needle (EMBECTA PEN NEEDLE NANO) 32G X 4 MM MISC 1 Dose by Does not apply route daily. 30 each 0   levocetirizine (XYZAL) 5 MG tablet Take 5 mg by mouth every evening.     meloxicam  (MOBIC ) 15 MG tablet TAKE 1 TABLET (15 MG TOTAL) BY MOUTH DAILY AS NEEDED FOR PAIN WITH FOOD 30 tablet 1   mometasone  (ELOCON ) 0.1 % cream Apply 1 application. topically daily as needed (Rash). Qd up to 5 days a week to eczema on hands and left foot prn flares 45 g 3   mupirocin  ointment (BACTROBAN ) 2 % Apply 1 application topically 2 (two) times daily. To lesions on abdomen. 22 g 0   omeprazole  (PRILOSEC) 20 MG capsule TAKE 1 CAPSULE (20 MG TOTAL) BY MOUTH DAILY. TAKE 30 MINUTES TO ONE HOUR BEFORE BREAKFAST 90 capsule 3   polyethylene glycol (MIRALAX  / GLYCOLAX ) 17 g packet Take 17 g by mouth daily. 30 each 0   rosuvastatin  (CRESTOR ) 5 MG tablet Take 1 tablet (5 mg total) by mouth daily. 90 tablet 3   Ruxolitinib Phosphate  (OPZELURA ) 1.5 % CREA Apply to hands once a day 60 g 2   saccharomyces boulardii (FLORASTOR) 250 MG capsule Take 1 capsule (250 mg total) by mouth 2 (two) times daily. 60 capsule 0   sertraline  (ZOLOFT ) 50 MG tablet Take 3 tablets (150 mg total) by mouth at bedtime. 270 tablet 3   traZODone  (DESYREL ) 50 MG tablet TAKE 0.5-1 TABLETS BY MOUTH AT BEDTIME AS NEEDED  FOR SLEEP. 90 tablet 3   triamcinolone  (KENALOG ) 0.025 % cream Apply 1 application topically 2 (two) times daily. 80 g 2   triamcinolone  ointment (KENALOG ) 0.5 % Apply 1 Application topically 2 (two) times daily. Use sparingly for < 1 week. Do not use internal genitalia 15 g 2   valACYclovir  (VALTREX ) 500 MG tablet Take 500 mg PO x q 12h x 3 days. ASAP w/in 24h of sx onset. 30 tablet 2   [DISCONTINUED] fluticasone  (FLONASE ) 50 MCG/ACT nasal spray Place 2 sprays into both nostrils daily. 16 g 0   No current facility-administered medications on file prior to visit.   "

## 2024-09-26 NOTE — Patient Instructions (Addendum)
 You need follow up Dr Dodson; please call to schedule  Limit use of mobic  and take omeprazole  daily.  Please do home sleep study. Risks of stroke, heart attack increased without treatment.   Diflucan  orally once weekly x 4  Increase mounjaro  to 7.5mg 

## 2024-09-27 ENCOUNTER — Ambulatory Visit: Payer: Self-pay | Admitting: Family

## 2024-09-28 DIAGNOSIS — R5383 Other fatigue: Secondary | ICD-10-CM | POA: Insufficient documentation

## 2024-09-28 DIAGNOSIS — H9319 Tinnitus, unspecified ear: Secondary | ICD-10-CM | POA: Insufficient documentation

## 2024-09-28 MED ORDER — FLUCONAZOLE 150 MG PO TABS: 150.0000 mg | ORAL_TABLET | ORAL | 0 refills | Status: AC

## 2024-09-28 MED ORDER — TIRZEPATIDE 7.5 MG/0.5ML ~~LOC~~ SOAJ: 7.5000 mg | SUBCUTANEOUS | 2 refills | Status: AC

## 2024-09-28 NOTE — Assessment & Plan Note (Signed)
 Chronic, symptomatically stable.  Continue Eliquis  5 mg twice daily.  She is scheduled to establish care with Dr. Babara, hematology next month.

## 2024-09-28 NOTE — Assessment & Plan Note (Addendum)
 Chronic, nonpulsatile, nonpainful tinnitus.  Discussed episode when walking her dog when she lost her balance and subsequently hit her chin.  She is on Eliquis  5 mg twice daily.  Discussed further evaluation including neuroimaging.  She politely declines at this time and does not feel further evaluation is necessary.  No recurrence of imbalance.  Discussed return to ENT, vestibular rehab.  She politely declines. Will monitor

## 2024-09-28 NOTE — Assessment & Plan Note (Signed)
 Chronic, poorly controlled.  Discussed symptoms of sleep apnea and risk of cardiac arrhythmia, stroke and heart attack if not treated.  She has home sleep study at home as provided by pulmonology.  Strongly encouraged her to complete this test.  Continue Mounjaro .

## 2024-09-28 NOTE — Assessment & Plan Note (Signed)
 Presentation consistent with Candida.  Topical clotrimazole  has not been effective.  Start Diflucan  150 mg once weekly x 4

## 2024-09-28 NOTE — Assessment & Plan Note (Signed)
 Chronic, excellent glycemic control.  She has done well on Mounjaro  5 mg and interested in increasing to 7.5 for additional benefit of weight loss.  Start Mounjaro  7.5 mg

## 2024-10-04 ENCOUNTER — Ambulatory Visit: Payer: Self-pay

## 2024-10-04 NOTE — Telephone Encounter (Signed)
 FYI Only or Action Required?: Action required by provider: request for appointment.  Patient was last seen in primary care on 09/26/2024 by Dineen Rollene MATSU, FNP.  Called Nurse Triage reporting Breast Pain.  Symptoms began several days ago.  Interventions attempted: Nothing.  Symptoms are: unchanged.Pain, cyst left breast. Noticed a red line across breast. No fever. Call if symptoms worsen.  Triage Disposition: See PCP When Office is Open (Within 3 Days)  Patient/caregiver understands and will follow disposition?: Yes     Reason for Triage: L breast pain and cyst    Reason for Disposition  Breast lump  Answer Assessment - Initial Assessment Questions 1. SYMPTOM: What's the main symptom you're concerned about?  (e.g., lump, nipple discharge, pain, rash)     Lump,cyst pain 2. LOCATION: Where is the  located?     Left, top inner aspect 3. ONSET: When did   start?     2 days ago 4. PRIOR HISTORY: Do you have any history of prior problems with your breasts? (e.g., breast cancer, breast implant, fibrocystic breast disease)     cyst 5. CAUSE: What do you think is causing this symptom?     unsure 6. OTHER SYMPTOMS: Do you have any other symptoms? (e.g., breast pain, fever, nipple discharge, redness or rash)     no 7. PREGNANCY-BREASTFEEDING: Is there any chance you are pregnant? When was your last menstrual period? Are you breastfeeding?     no  Protocols used: Breast Symptoms-A-AH

## 2024-10-07 ENCOUNTER — Telehealth: Payer: Self-pay | Admitting: Family

## 2024-10-07 NOTE — Telephone Encounter (Signed)
 Left VM and MyChart message to let pt know office will be closed due to inclement weather on Monday 2.2.26, and appt will need to be rescheduled.  [id number], please reschedule pt if they call back -[id number]

## 2024-10-08 ENCOUNTER — Ambulatory Visit: Admitting: Internal Medicine

## 2024-10-10 NOTE — Telephone Encounter (Signed)
 Left message to call the office back to reschedule for the left breast pain & cyst.

## 2024-10-16 ENCOUNTER — Inpatient Hospital Stay

## 2024-10-16 ENCOUNTER — Ambulatory Visit: Admitting: Internal Medicine

## 2024-10-16 ENCOUNTER — Inpatient Hospital Stay: Admitting: Oncology

## 2024-12-27 ENCOUNTER — Ambulatory Visit: Admitting: Family
